# Patient Record
Sex: Male | Born: 1960 | Race: Black or African American | Hispanic: No | Marital: Married | Smoking: Current every day smoker
Health system: Southern US, Community
[De-identification: ages and names within clinical notes are randomized; demographics above are authoritative.]

## PROBLEM LIST (undated history)

## (undated) DIAGNOSIS — R569 Unspecified convulsions: Secondary | ICD-10-CM

## (undated) DIAGNOSIS — Z973 Presence of spectacles and contact lenses: Secondary | ICD-10-CM

## (undated) DIAGNOSIS — K219 Gastro-esophageal reflux disease without esophagitis: Secondary | ICD-10-CM

## (undated) DIAGNOSIS — I10 Essential (primary) hypertension: Secondary | ICD-10-CM

## (undated) HISTORY — PX: TONSILLECTOMY: SUR1361

## (undated) HISTORY — PX: COLONOSCOPY: SHX174

## (undated) HISTORY — PX: WISDOM TOOTH EXTRACTION: SHX21

---

## 1997-07-07 ENCOUNTER — Ambulatory Visit (HOSPITAL_COMMUNITY): Admission: RE | Admit: 1997-07-07 | Discharge: 1997-07-07 | Payer: Self-pay | Admitting: Family Medicine

## 2005-01-17 HISTORY — PX: UMBILICAL HERNIA REPAIR: SUR1181

## 2005-11-30 ENCOUNTER — Encounter: Admission: RE | Admit: 2005-11-30 | Discharge: 2005-11-30 | Payer: Self-pay | Admitting: General Surgery

## 2005-12-05 ENCOUNTER — Ambulatory Visit (HOSPITAL_BASED_OUTPATIENT_CLINIC_OR_DEPARTMENT_OTHER): Admission: RE | Admit: 2005-12-05 | Discharge: 2005-12-05 | Payer: Self-pay | Admitting: General Surgery

## 2013-02-04 ENCOUNTER — Ambulatory Visit
Admission: RE | Admit: 2013-02-04 | Discharge: 2013-02-04 | Disposition: A | Discharge status: Home / Self Care | Payer: Worker's Compensation | Source: Ambulatory Visit | Attending: Family Medicine | Admitting: Family Medicine

## 2013-02-04 ENCOUNTER — Other Ambulatory Visit: Payer: Self-pay | Admitting: Family Medicine

## 2013-02-04 DIAGNOSIS — M25511 Pain in right shoulder: Secondary | ICD-10-CM

## 2013-04-11 ENCOUNTER — Other Ambulatory Visit: Payer: Self-pay | Admitting: Orthopedic Surgery

## 2013-04-30 ENCOUNTER — Encounter (HOSPITAL_BASED_OUTPATIENT_CLINIC_OR_DEPARTMENT_OTHER): Payer: Self-pay | Admitting: *Deleted

## 2013-04-30 NOTE — Progress Notes (Signed)
Will come in for bmet-ekg-no cardiac or resp problems

## 2013-05-02 ENCOUNTER — Encounter (HOSPITAL_BASED_OUTPATIENT_CLINIC_OR_DEPARTMENT_OTHER)
Admission: RE | Admit: 2013-05-02 | Discharge: 2013-05-02 | Disposition: A | Discharge status: Home / Self Care | Payer: BC Managed Care – PPO | Source: Ambulatory Visit | Attending: Orthopedic Surgery | Admitting: Orthopedic Surgery

## 2013-05-02 ENCOUNTER — Other Ambulatory Visit: Payer: Self-pay

## 2013-05-02 DIAGNOSIS — Z01810 Encounter for preprocedural cardiovascular examination: Secondary | ICD-10-CM | POA: Insufficient documentation

## 2013-05-02 LAB — BASIC METABOLIC PANEL
BUN: 10 mg/dL (ref 6–23)
CALCIUM: 9.7 mg/dL (ref 8.4–10.5)
CO2: 23 mEq/L (ref 19–32)
Chloride: 97 mEq/L (ref 96–112)
Creatinine, Ser: 0.89 mg/dL (ref 0.50–1.35)
GFR calc non Af Amer: >90 mL/min (ref >90)
GLUCOSE: 85 mg/dL (ref 70–99)
Potassium: 4.6 mEq/L (ref 3.7–5.3)
Sodium: 137 mEq/L (ref 137–147)

## 2013-05-06 ENCOUNTER — Encounter (HOSPITAL_BASED_OUTPATIENT_CLINIC_OR_DEPARTMENT_OTHER): Payer: Self-pay

## 2013-05-06 ENCOUNTER — Ambulatory Visit (HOSPITAL_BASED_OUTPATIENT_CLINIC_OR_DEPARTMENT_OTHER)
Admission: RE | Admit: 2013-05-06 | Discharge: 2013-05-06 | Disposition: A | Discharge status: Home / Self Care | Payer: BC Managed Care – PPO | Source: Ambulatory Visit | Attending: Orthopedic Surgery | Admitting: Orthopedic Surgery

## 2013-05-06 ENCOUNTER — Encounter (HOSPITAL_BASED_OUTPATIENT_CLINIC_OR_DEPARTMENT_OTHER): Payer: BC Managed Care – PPO | Admitting: Anesthesiology

## 2013-05-06 ENCOUNTER — Ambulatory Visit (HOSPITAL_BASED_OUTPATIENT_CLINIC_OR_DEPARTMENT_OTHER): Payer: BC Managed Care – PPO | Admitting: Anesthesiology

## 2013-05-06 ENCOUNTER — Encounter (HOSPITAL_BASED_OUTPATIENT_CLINIC_OR_DEPARTMENT_OTHER)
Admission: RE | Disposition: A | Discharge status: Home / Self Care | Payer: Self-pay | Source: Ambulatory Visit | Attending: Orthopedic Surgery

## 2013-05-06 DIAGNOSIS — Y990 Civilian activity done for income or pay: Secondary | ICD-10-CM | POA: Insufficient documentation

## 2013-05-06 DIAGNOSIS — F17200 Nicotine dependence, unspecified, uncomplicated: Secondary | ICD-10-CM | POA: Insufficient documentation

## 2013-05-06 DIAGNOSIS — X58XXXA Exposure to other specified factors, initial encounter: Secondary | ICD-10-CM | POA: Insufficient documentation

## 2013-05-06 DIAGNOSIS — J4489 Other specified chronic obstructive pulmonary disease: Secondary | ICD-10-CM | POA: Insufficient documentation

## 2013-05-06 DIAGNOSIS — I10 Essential (primary) hypertension: Secondary | ICD-10-CM | POA: Insufficient documentation

## 2013-05-06 DIAGNOSIS — S43429A Sprain of unspecified rotator cuff capsule, initial encounter: Secondary | ICD-10-CM | POA: Insufficient documentation

## 2013-05-06 DIAGNOSIS — J449 Chronic obstructive pulmonary disease, unspecified: Secondary | ICD-10-CM | POA: Insufficient documentation

## 2013-05-06 DIAGNOSIS — M75100 Unspecified rotator cuff tear or rupture of unspecified shoulder, not specified as traumatic: Secondary | ICD-10-CM

## 2013-05-06 HISTORY — DX: Essential (primary) hypertension: I10

## 2013-05-06 HISTORY — PX: SHOULDER ARTHROSCOPY WITH SUBACROMIAL DECOMPRESSION: SHX5684

## 2013-05-06 HISTORY — DX: Presence of spectacles and contact lenses: Z97.3

## 2013-05-06 LAB — POCT HEMOGLOBIN-HEMACUE: Hemoglobin: 15.3 g/dL (ref 13.0–17.0)

## 2013-05-06 SURGERY — SHOULDER ARTHROSCOPY WITH SUBACROMIAL DECOMPRESSION
Anesthesia: General | Laterality: Right

## 2013-05-06 MED ORDER — FENTANYL CITRATE 0.05 MG/ML IJ SOLN
50.0000 ug | Freq: Once | INTRAMUSCULAR | Status: AC
  Administered 2013-05-06: 100 ug via INTRAVENOUS

## 2013-05-06 MED ORDER — PHENYLEPHRINE HCL 10 MG/ML IJ SOLN
INTRAMUSCULAR | Status: DC | PRN
  Administered 2013-05-06: 40 ug via INTRAVENOUS

## 2013-05-06 MED ORDER — OXYCODONE HCL 5 MG/5ML PO SOLN: 5.0000 mg | Freq: Once | ORAL | Status: AC | PRN

## 2013-05-06 MED ORDER — FENTANYL CITRATE 0.05 MG/ML IJ SOLN: 50.0000 ug | INTRAMUSCULAR | Status: DC | PRN

## 2013-05-06 MED ORDER — HYDROMORPHONE HCL PF 1 MG/ML IJ SOLN
0.2500 mg | INTRAMUSCULAR | Status: DC | PRN
  Administered 2013-05-06: 0.5 mg via INTRAVENOUS

## 2013-05-06 MED ORDER — HYDROMORPHONE HCL PF 1 MG/ML IJ SOLN
INTRAMUSCULAR | Status: AC
  Filled 2013-05-06: qty 1

## 2013-05-06 MED ORDER — MIDAZOLAM HCL 2 MG/2ML IJ SOLN
1.0000 mg | INTRAMUSCULAR | Status: DC | PRN
  Administered 2013-05-06: 2 mg via INTRAVENOUS

## 2013-05-06 MED ORDER — OXYCODONE HCL 5 MG PO TABS
ORAL_TABLET | ORAL | Status: AC
  Filled 2013-05-06: qty 1

## 2013-05-06 MED ORDER — BUPIVACAINE-EPINEPHRINE PF 0.5-1:200000 % IJ SOLN
INTRAMUSCULAR | Status: DC | PRN
  Administered 2013-05-06: 25 mL

## 2013-05-06 MED ORDER — POVIDONE-IODINE 7.5 % EX SOLN: Freq: Once | CUTANEOUS | Status: DC

## 2013-05-06 MED ORDER — LIDOCAINE HCL (CARDIAC) 20 MG/ML IV SOLN
INTRAVENOUS | Status: DC | PRN
  Administered 2013-05-06: 80 mg via INTRAVENOUS

## 2013-05-06 MED ORDER — PROPOFOL 10 MG/ML IV BOLUS
INTRAVENOUS | Status: DC | PRN
  Administered 2013-05-06: 50 mg via INTRAVENOUS
  Administered 2013-05-06: 200 mg via INTRAVENOUS

## 2013-05-06 MED ORDER — DEXAMETHASONE SODIUM PHOSPHATE 4 MG/ML IJ SOLN
INTRAMUSCULAR | Status: DC | PRN
  Administered 2013-05-06: 10 mg via INTRAVENOUS

## 2013-05-06 MED ORDER — ROCURONIUM BROMIDE 100 MG/10ML IV SOLN
INTRAVENOUS | Status: DC | PRN
  Administered 2013-05-06: 40 mg via INTRAVENOUS

## 2013-05-06 MED ORDER — DOCUSATE SODIUM 100 MG PO CAPS: 100.0000 mg | ORAL_CAPSULE | Freq: Three times a day (TID) | ORAL | Status: DC | PRN

## 2013-05-06 MED ORDER — SODIUM CHLORIDE 0.9 % IR SOLN
Status: DC | PRN
Start: 2013-05-06 — End: 2013-05-06
  Administered 2013-05-06: 6000 mL

## 2013-05-06 MED ORDER — FENTANYL CITRATE 0.05 MG/ML IJ SOLN
INTRAMUSCULAR | Status: DC | PRN
  Administered 2013-05-06: 50 ug via INTRAVENOUS

## 2013-05-06 MED ORDER — ALBUTEROL SULFATE HFA 108 (90 BASE) MCG/ACT IN AERS
INHALATION_SPRAY | RESPIRATORY_TRACT | Status: DC | PRN
  Administered 2013-05-06 (×3): 2 via RESPIRATORY_TRACT

## 2013-05-06 MED ORDER — GLYCOPYRROLATE 0.2 MG/ML IJ SOLN
INTRAMUSCULAR | Status: DC | PRN
  Administered 2013-05-06: .7 mg via INTRAVENOUS

## 2013-05-06 MED ORDER — OXYCODONE HCL 5 MG PO TABS
5.0000 mg | ORAL_TABLET | Freq: Once | ORAL | Status: AC | PRN
  Administered 2013-05-06: 5 mg via ORAL

## 2013-05-06 MED ORDER — PROMETHAZINE HCL 25 MG/ML IJ SOLN: 6.2500 mg | INTRAMUSCULAR | Status: DC | PRN

## 2013-05-06 MED ORDER — NEOSTIGMINE METHYLSULFATE 1 MG/ML IJ SOLN
INTRAMUSCULAR | Status: DC | PRN
  Administered 2013-05-06: 4 mg via INTRAVENOUS

## 2013-05-06 MED ORDER — DEXAMETHASONE SODIUM PHOSPHATE 10 MG/ML IJ SOLN
INTRAMUSCULAR | Status: DC | PRN
  Administered 2013-05-06: 4 mg

## 2013-05-06 MED ORDER — SUCCINYLCHOLINE CHLORIDE 20 MG/ML IJ SOLN
INTRAMUSCULAR | Status: DC | PRN
  Administered 2013-05-06: 100 mg via INTRAVENOUS

## 2013-05-06 MED ORDER — FENTANYL CITRATE 0.05 MG/ML IJ SOLN
INTRAMUSCULAR | Status: AC
  Filled 2013-05-06: qty 2

## 2013-05-06 MED ORDER — LACTATED RINGERS IV SOLN
INTRAVENOUS | Status: DC
  Administered 2013-05-06: 11:00:00 via INTRAVENOUS
  Administered 2013-05-06: 10 mL/h via INTRAVENOUS

## 2013-05-06 MED ORDER — CEFAZOLIN SODIUM-DEXTROSE 2-3 GM-% IV SOLR
INTRAVENOUS | Status: AC
  Filled 2013-05-06: qty 50

## 2013-05-06 MED ORDER — MIDAZOLAM HCL 2 MG/2ML IJ SOLN: 1.0000 mg | INTRAMUSCULAR | Status: DC | PRN

## 2013-05-06 MED ORDER — MIDAZOLAM HCL 2 MG/2ML IJ SOLN
INTRAMUSCULAR | Status: AC
  Filled 2013-05-06: qty 2

## 2013-05-06 MED ORDER — CEFAZOLIN SODIUM-DEXTROSE 2-3 GM-% IV SOLR
2.0000 g | INTRAVENOUS | Status: AC
  Administered 2013-05-06: 2 g via INTRAVENOUS

## 2013-05-06 MED ORDER — PHENYLEPHRINE HCL 10 MG/ML IJ SOLN
10.0000 mg | INTRAMUSCULAR | Status: DC | PRN
  Administered 2013-05-06: 50 ug/min via INTRAVENOUS

## 2013-05-06 MED ORDER — OXYCODONE-ACETAMINOPHEN 5-325 MG PO TABS: 1.0000 | ORAL_TABLET | ORAL | Status: DC | PRN

## 2013-05-06 MED ORDER — ONDANSETRON HCL 4 MG/2ML IJ SOLN
INTRAMUSCULAR | Status: DC | PRN
  Administered 2013-05-06: 4 mg via INTRAVENOUS

## 2013-05-06 MED ORDER — EPHEDRINE SULFATE 50 MG/ML IJ SOLN
INTRAMUSCULAR | Status: DC | PRN
  Administered 2013-05-06 (×2): 15 mg via INTRAVENOUS

## 2013-05-06 SURGICAL SUPPLY — 87 items
ADH SKN CLS APL DERMABOND .7 (GAUZE/BANDAGES/DRESSINGS)
ANCH SUT SWLK 19.1X4.75 VT (Anchor) ×4 IMPLANT
ANCHOR PEEK 4.75X19.1 SWLK C (Anchor) ×8 IMPLANT
APL SKNCLS STERI-STRIP NONHPOA (GAUZE/BANDAGES/DRESSINGS)
BENZOIN TINCTURE PRP APPL 2/3 (GAUZE/BANDAGES/DRESSINGS) IMPLANT
BLADE SURG 15 STRL LF DISP TIS (BLADE) IMPLANT
BLADE SURG 15 STRL SS (BLADE)
BLADE SURG ROTATE 9660 (MISCELLANEOUS) IMPLANT
BUR OVAL 4.0 (BURR) ×3 IMPLANT
CANISTER SUCT 3000ML (MISCELLANEOUS) IMPLANT
CANNULA 5.75X71 LONG (CANNULA) ×3 IMPLANT
CANNULA TWIST IN 8.25X7CM (CANNULA) ×2 IMPLANT
CHLORAPREP W/TINT 26ML (MISCELLANEOUS) ×3 IMPLANT
CLOSURE WOUND 1/2 X4 (GAUZE/BANDAGES/DRESSINGS)
DECANTER SPIKE VIAL GLASS SM (MISCELLANEOUS) IMPLANT
DERMABOND ADVANCED (GAUZE/BANDAGES/DRESSINGS)
DERMABOND ADVANCED .7 DNX12 (GAUZE/BANDAGES/DRESSINGS) IMPLANT
DRAPE INCISE IOBAN 66X45 STRL (DRAPES) ×3 IMPLANT
DRAPE STERI 35X30 U-POUCH (DRAPES) ×3 IMPLANT
DRAPE SURG 17X23 STRL (DRAPES) ×3 IMPLANT
DRAPE U 20/CS (DRAPES) ×3 IMPLANT
DRAPE U-SHAPE 47X51 STRL (DRAPES) ×3 IMPLANT
DRAPE U-SHAPE 76X120 STRL (DRAPES) ×6 IMPLANT
DRSG PAD ABDOMINAL 8X10 ST (GAUZE/BANDAGES/DRESSINGS) ×3 IMPLANT
ELECT REM PT RETURN 9FT ADLT (ELECTROSURGICAL) ×3
ELECTRODE REM PT RTRN 9FT ADLT (ELECTROSURGICAL) ×1 IMPLANT
GAUZE SPONGE 4X4 16PLY XRAY LF (GAUZE/BANDAGES/DRESSINGS) IMPLANT
GAUZE XEROFORM 1X8 LF (GAUZE/BANDAGES/DRESSINGS) ×3 IMPLANT
GLOVE BIO SURGEON STRL SZ 6.5 (GLOVE) ×1 IMPLANT
GLOVE BIO SURGEON STRL SZ7 (GLOVE) ×3 IMPLANT
GLOVE BIO SURGEON STRL SZ7.5 (GLOVE) ×6 IMPLANT
GLOVE BIO SURGEONS STRL SZ 6.5 (GLOVE) ×1
GLOVE BIOGEL PI IND STRL 7.0 (GLOVE) ×1 IMPLANT
GLOVE BIOGEL PI IND STRL 8 (GLOVE) ×2 IMPLANT
GLOVE BIOGEL PI INDICATOR 7.0 (GLOVE) ×4
GLOVE BIOGEL PI INDICATOR 8 (GLOVE) ×4
GOWN STRL REUS W/ TWL LRG LVL3 (GOWN DISPOSABLE) ×2 IMPLANT
GOWN STRL REUS W/TWL LRG LVL3 (GOWN DISPOSABLE) ×6
GOWN STRL REUS W/TWL XL LVL4 (GOWN DISPOSABLE) ×3 IMPLANT
MANIFOLD NEPTUNE II (INSTRUMENTS) ×3 IMPLANT
NDL 1/2 CIR CATGUT .05X1.09 (NEEDLE) IMPLANT
NDL SCORPION MULTI FIRE (NEEDLE) IMPLANT
NDL SUT 6 .5 CRC .975X.05 MAYO (NEEDLE) IMPLANT
NEEDLE 1/2 CIR CATGUT .05X1.09 (NEEDLE) IMPLANT
NEEDLE MAYO TAPER (NEEDLE)
NEEDLE SCORPION MULTI FIRE (NEEDLE) ×3 IMPLANT
NS IRRIG 1000ML POUR BTL (IV SOLUTION) IMPLANT
PACK ARTHROSCOPY DSU (CUSTOM PROCEDURE TRAY) ×3 IMPLANT
PACK BASIN DAY SURGERY FS (CUSTOM PROCEDURE TRAY) ×3 IMPLANT
PENCIL BUTTON HOLSTER BLD 10FT (ELECTRODE) IMPLANT
RESECTOR FULL RADIUS 4.2MM (BLADE) ×3 IMPLANT
SHEET MEDIUM DRAPE 40X70 STRL (DRAPES) IMPLANT
SLEEVE SCD COMPRESS KNEE MED (MISCELLANEOUS) ×3 IMPLANT
SLING ARM IMMOBILIZER MED (SOFTGOODS) ×2 IMPLANT
SLING ARM LRG ADULT FOAM STRAP (SOFTGOODS) ×2 IMPLANT
SLING ARM MED ADULT FOAM STRAP (SOFTGOODS) IMPLANT
SLING ARM XL FOAM STRAP (SOFTGOODS) ×2 IMPLANT
SPONGE GAUZE 4X4 12PLY (GAUZE/BANDAGES/DRESSINGS) ×3 IMPLANT
SPONGE LAP 4X18 X RAY DECT (DISPOSABLE) IMPLANT
STRIP CLOSURE SKIN 1/2X4 (GAUZE/BANDAGES/DRESSINGS) IMPLANT
SUCTION FRAZIER TIP 10 FR DISP (SUCTIONS) IMPLANT
SUPPORT WRAP ARM LG (MISCELLANEOUS) ×2 IMPLANT
SUT BONE WAX W31G (SUTURE) IMPLANT
SUT ETHIBOND 2 OS 4 DA (SUTURE) IMPLANT
SUT ETHILON 3 0 PS 1 (SUTURE) ×3 IMPLANT
SUT ETHILON 4 0 PS 2 18 (SUTURE) IMPLANT
SUT FIBERWIRE #2 38 T-5 BLUE (SUTURE)
SUT MNCRL AB 3-0 PS2 18 (SUTURE) IMPLANT
SUT MNCRL AB 4-0 PS2 18 (SUTURE) IMPLANT
SUT PDS AB 0 CT 36 (SUTURE) IMPLANT
SUT PROLENE 3 0 PS 2 (SUTURE) IMPLANT
SUT TIGER TAPE 7 IN WHITE (SUTURE) ×4 IMPLANT
SUT VIC AB 0 CT1 27 (SUTURE)
SUT VIC AB 0 CT1 27XBRD ANBCTR (SUTURE) IMPLANT
SUT VIC AB 2-0 SH 27 (SUTURE)
SUT VIC AB 2-0 SH 27XBRD (SUTURE) IMPLANT
SUTURE FIBERWR #2 38 T-5 BLUE (SUTURE) IMPLANT
SYR BULB 3OZ (MISCELLANEOUS) IMPLANT
TAPE FIBER 2MM 7IN #2 BLUE (SUTURE) ×4 IMPLANT
TOWEL OR 17X24 6PK STRL BLUE (TOWEL DISPOSABLE) ×3 IMPLANT
TOWEL OR NON WOVEN STRL DISP B (DISPOSABLE) ×3 IMPLANT
TUBE CONNECTING 20'X1/4 (TUBING) ×1
TUBE CONNECTING 20X1/4 (TUBING) ×2 IMPLANT
TUBING ARTHROSCOPY IRRIG 16FT (MISCELLANEOUS) ×3 IMPLANT
WAND STAR VAC 90 (SURGICAL WAND) ×3 IMPLANT
WATER STERILE IRR 1000ML POUR (IV SOLUTION) ×3 IMPLANT
YANKAUER SUCT BULB TIP NO VENT (SUCTIONS) IMPLANT

## 2013-05-06 NOTE — Anesthesia Preprocedure Evaluation (Signed)
Anesthesia Evaluation  Patient identified by MRN, date of birth, ID band Patient awake    Reviewed: Allergy & Precautions, H&P , NPO status , Patient's Chart, lab work & pertinent test results  Airway Mallampati: II TM Distance: >3 FB Neck ROM: Full    Dental   Pulmonary COPDCurrent Smoker,  + rhonchi         Cardiovascular hypertension, Rhythm:Regular Rate:Normal     Neuro/Psych    GI/Hepatic   Endo/Other    Renal/GU      Musculoskeletal   Abdominal   Peds  Hematology   Anesthesia Other Findings   Reproductive/Obstetrics                           Anesthesia Physical Anesthesia Plan  ASA: II  Anesthesia Plan: General   Post-op Pain Management:    Induction: Intravenous  Airway Management Planned: Oral ETT  Additional Equipment:   Intra-op Plan:   Post-operative Plan: Extubation in OR  Informed Consent: I have reviewed the patients History and Physical, chart, labs and discussed the procedure including the risks, benefits and alternatives for the proposed anesthesia with the patient or authorized representative who has indicated his/her understanding and acceptance.     Plan Discussed with: CRNA and Surgeon  Anesthesia Plan Comments:         Anesthesia Quick Evaluation

## 2013-05-06 NOTE — Progress Notes (Signed)
Assisted Dr. Kasik with right, ultrasound guided, interscalene  block. Side rails up, monitors on throughout procedure. See vital signs in flow sheet. Tolerated Procedure well. 

## 2013-05-06 NOTE — Transfer of Care (Signed)
Immediate Anesthesia Transfer of Care Note  Patient: Reginald [name]  Procedure(s) Performed: Procedure(s) with comments: Right shoulder arthroscopy rotator cuff tear repair, subacromial decompression (Right) - Right shoulder arthroscopy rotator cuff tear repair, subacromial decompression  Patient Location: PACU  Anesthesia Type:General  Level of Consciousness: awake and alert   Airway & Oxygen Therapy: Patient Spontanous Breathing and Patient connected to face mask oxygen  Post-op Assessment: Report given to PACU RN and Post -op Vital signs reviewed and stable  Post vital signs: Reviewed and stable  Complications: No apparent anesthesia complications

## 2013-05-06 NOTE — Anesthesia Postprocedure Evaluation (Signed)
  Anesthesia Post-op Note  Patient: Reginald [name]  Procedure(s) Performed: Procedure(s) with comments: Right shoulder arthroscopy rotator cuff tear repair, subacromial decompression (Right) - Right shoulder arthroscopy rotator cuff tear repair, subacromial decompression  Patient Location: PACU  Anesthesia Type:GA combined with regional for post-op pain  Level of Consciousness: awake and alert   Airway and Oxygen Therapy: Patient Spontanous Breathing  Post-op Pain: none  Post-op Assessment: Post-op Vital signs reviewed, Patient's Cardiovascular Status Stable, Respiratory Function Stable, Patent Airway, No signs of Nausea or vomiting and Pain level controlled  Post-op Vital Signs: Reviewed and stable  Last Vitals:  Filed Vitals:   05/06/13 1430  BP: 131/85  Pulse: 79  Temp:   Resp: 26    Complications: No apparent anesthesia complications

## 2013-05-06 NOTE — Op Note (Signed)
Procedure(s): Right shoulder arthroscopy rotator cuff tear repair, subacromial decompression Procedure Note  Keighan Mayford KnifeWilliams male 53 y.o. 05/06/2013  Procedure(s) and Anesthesia Type:    #1 right shoulder arthroscopic rotator cuff repair   #2 right shoulder arthroscopic subacromial decompression    #3 right shoulder arthroscopic biceps tenotomy/debridement  Surgeon(s) and Role:    * Mable ParisJustin William Donoven Pett, MD - Primary     Surgeon: Mable ParisJustin William Geraldine Sandberg   Assistants: Damita Lackanielle Lalibert PA-C [facility](Danielle was present and scrubbed throughout the procedure and was essential in positioning, assisting with the camera and instrumentation,, and closure)  Anesthesia: General endotracheal anesthesia with preoperative interscalene block    Procedure Detail  Right shoulder arthroscopy rotator cuff tear repair, subacromial decompression  Estimated Blood Loss: Min         Drains: none  Blood Given: none         Specimens: none        Complications:  * No complications entered in OR log *         Disposition: PACU - hemodynamically stable.         Condition: stable    Procedure:   INDICATIONS FOR SURGERY: The patient is 53 y.o. male who had an injury at work to his right shoulder. He failed conservative management and would not have an MRI which revealed a full thickness supraspinatus tear. He was indicated for surgery to try and decrease pain and restore function. He understood risks benefits alternatives to the procedure and wished to go forward with surgery.  OPERATIVE FINDINGS: Examination under anesthesia: No stiffness or instability Diagnostic Arthroscopy:  Glenoid articular cartilage: Intact Humeral head articular cartilage: Intact Labrum: Intact, severe long head proximal biceps tendon tear involving about 75% of the tendon. Biceps tenotomy was performed for pain relief. 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  DESCRIPTION OF PROCEDURE: The patient was identified in preoperative  holding area where I personally marked the operative site after  verifying site, side, and procedure with the patient. An interscalene block was given by the attending anesthesiologist the holding area.  The patient was taken back to the operating room where general anesthesia was induced without complication and was placed in the beach-chair position with the back  elevated about 60 degrees and all extremities and head and neck carefully padded and  positioned.   The right upper extremity was then prepped and  draped in a standard sterile fashion. The appropriate time-out  procedure was carried out. The patient did receive IV antibiotics  within 30 minutes of incision.   A small posterior portal incision was made and the arthroscope was introduced into the joint. An anterior portal was then established above the subscapularis using needle localization. Small cannula was placed anteriorly. Diagnostic arthroscopy was then carried out with findings as described above.  Initially noted was a lot of severe tearing of the proximal biceps tendon. This was debrided. The remaining biceps tendon was minimal and clearly involved greater than 50% of the thickness of the tendon. This was felt to be a significant pain generator if left untreated and therefore a biceps tenotomy was performed with the ArthriCare. The biceps tendon was allowed to retract from the joint. The upper border of the subscapularis have partial-thickness tear which was also debrided with a shaver down to healthy bleeding tendon. The remainder the  tendon was intact and no repair was felt necessary. The humeral joint surfaces were intact without significant chondromalacia. The posterior rotator cuff was  intact. The supraspinatus was torn and retracted minimally. This was gently debrided from the undersurface.  The arthroscope was then introduced into the subacromial space a standard lateral portal was established with needle localization. The shaver was used through the lateral portal to perform extensive bursectomy. Coracoacromial ligament was examined and found to be severely frayed indicating chronic impingement..  The rotator cuff tear was examined carefully from the bursal surface and extensively debrided. The tear involved the entire supraspinatus but there is fairly minimal retraction of 1-1.5 cm. A lateral portal was established with a large cannula. A posterior lateral viewing portal was established and the camera was moved to this position for viewing. The tuberosity was debrided down to bleeding bone for healing. The rotator cuff tear was then repaired arthroscopically using 2 4.75 peak swivel lock anchors placed percutaneously off the lateral edge of the acromion, just off the articular margin. These were preloaded with fiber tape. The 4 strands of fiber tape were passed evenly spread throughout the tear and then brought over in a crossing pattern into 2 additional 4.75 swivel lock anchors in a lateral row. This allowed the tendon to lay down very nicely over the tuberosity. There is no undue tension on the repair.  The coracoacromial ligament was taken down off the anterior acromion with the ArthroCare exposing a moderate sized hooked anterior acromial spur. A high-speed bur was then used through the lateral portal to take down the anterior acromial spur from lateral to medial in a standard acromioplasty.  The acromioplasty was also viewed from the lateral portal and the bur was used as necessary to ensure that the acromion was completely flat from posterior to anterior.  The arthroscopic equipment was removed from the joint and the portals were closed with 3-0 nylon in an interrupted fashion.  Sterile dressings were then applied including Xeroform 4 x 4's ABDs and tape. The patient was then allowed to awaken from general anesthesia, placed in a sling, transferred to the stretcher and taken to the recovery room in stable condition.   POSTOPERATIVE PLAN: The patient will be discharged home today and will followup in one week for suture removal and wound check.  He will follow the standard cuff protocol.

## 2013-05-06 NOTE — Anesthesia Procedure Notes (Addendum)
Anesthesia Regional Block:  Interscalene brachial plexus block  Pre-Anesthetic Checklist: ,, timeout performed, Correct Patient, Correct Site, Correct Laterality, Correct Procedure, Correct Position, site marked, Risks and benefits discussed, Surgical consent,  Pre-op evaluation,  Post-op pain management  Laterality: Right  Prep: chloraprep       Needles:  Injection technique: Single-shot  Needle Type: Echogenic Stimulator Needle     Needle Length: 5cm 5 cm Needle Gauge: 22 and 22 G    Additional Needles:  Procedures: ultrasound guided (picture in chart) and nerve stimulator Interscalene brachial plexus block  Nerve Stimulator or Paresthesia:  Response: 0.5 mA,   Additional Responses:   Narrative:  Start time: 05/06/2013 10:46 AM End time: 05/06/2013 11:00 AM Injection made incrementally with aspirations every 5 mL. Anesthesiologist: Dr Gypsy Balsamkasik  Additional Notes: 1046-1100 R ISB POP CHG prep, sterile tech #22 stim/echo needle with good US visualization and PIX in chart Stim down to .5ma Multiple neg asp Reginald BuffMarc .5% w/epi 1:200000 total 25cc+decadron 4mg  infiltrated No compl Dr Gypsy BalsamKasik   Procedure Name: Intubation Performed by: York [name], Reginald Mollica W Pre-anesthesia Checklist: Patient identified, Timeout performed, Emergency Drugs available, Suction available and Patient being monitored Patient Re-evaluated:Patient Re-evaluated prior to inductionOxygen Delivery Method: Circle system utilized Preoxygenation: Pre-oxygenation with 100% oxygen Intubation Type: IV induction Ventilation: Mask ventilation without difficulty Laryngoscope Size: Miller and 2 Grade View: Grade II Tube type: Oral Number of attempts: 1 Airway Equipment and Method: Stylet Secured at: 22 cm Tube secured with: Tape Dental Injury: Teeth and Oropharynx as per pre-operative assessment

## 2013-05-06 NOTE — Discharge Instructions (Signed)
°Post Anesthesia Home Care Instructions ° °Activity: °Get plenty of rest for the remainder of the day. A responsible adult should stay with you for 24 hours following the procedure.  °For the next 24 hours, DO NOT: °-Drive a car °-Operate machinery °-Drink alcoholic beverages °-Take any medication unless instructed by your physician °-Make any legal decisions or sign important papers. ° °Meals: °Start with liquid foods such as gelatin or soup. Progress to regular foods as tolerated. Avoid greasy, spicy, heavy foods. If nausea and/or vomiting occur, drink only clear liquids until the nausea and/or vomiting subsides. Call your physician if vomiting continues. ° °Special Instructions/Symptoms: °Your throat may feel dry or sore from the anesthesia or the breathing tube placed in your throat during surgery. If this causes discomfort, gargle with warm salt water. The discomfort should disappear within 24 hours. ° ° °Regional Anesthesia Blocks ° °1. Numbness or the inability to move the "blocked" extremity may last from 3-48 hours after placement. The length of time depends on the medication injected and your individual response to the medication. If the numbness is not going away after 48 hours, call your surgeon. ° °2. The extremity that is blocked will need to be protected until the numbness is gone and the  Strength has returned. Because you cannot feel it, you will need to take extra care to avoid injury. Because it may be weak, you may have difficulty moving it or using it. You may not know what position it is in without looking at it while the block is in effect. ° °3. For blocks in the legs and feet, returning to weight bearing and walking needs to be done carefully. You will need to wait until the numbness is entirely gone and the strength has returned. You should be able to move your leg and foot normally before you try and bear weight or walk. You will need someone to be with you when you first try to ensure  you do not fall and possibly risk injury. ° °4. Bruising and tenderness at the needle site are common side effects and will resolve in a few days. ° °5. Persistent numbness or new problems with movement should be communicated to the surgeon or the [facility] ([phone])/ [facility] ([phone]). ° ° ° °Discharge Instructions after Arthroscopic Shoulder Repair ° ° °A sling has been provided for you. Remain in your sling at all times. This includes sleeping in your sling.  °Use ice on the shoulder intermittently over the first 48 hours after surgery.  °Pain medicine has been prescribed for you.  °Use your medicine liberally over the first 48 hours, and then you can begin to taper your use. You may take Extra Strength Tylenol or Tylenol only in place of the pain pills. DO NOT take ANY nonsteroidal anti-inflammatory pain medications: Advil, Motrin, Ibuprofen, Aleve, Naproxen, or Narprosyn.  °You may remove your dressing after two days. If the incision sites are still moist, place a Band-Aid over the moist site(s). Change Band-Aids daily until dry.  °You may shower 5 days after surgery. The incisions CANNOT get wet prior to 5 days. Simply allow the water to wash over the site and then pat dry. Do not rub the incisions. Make sure your axilla (armpit) is completely dry after showering.  °Take one aspirin a day for 2 weeks after surgery, unless you have an aspirin sensitivity/ allergy or asthma. ° ° °Please call [phone] during normal business hours or [phone] after hours   for any problems. Including the following: ° °- excessive redness of the incisions °- drainage for more than 4 days °- fever of more than 101.5 F ° °*Please note that pain medications will not be refilled after hours or on weekends. ° ° ° °

## 2013-05-06 NOTE — H&P (Signed)
Reginald [name] is an 53 y.o. male.   Chief Complaint: R shoulder pain and dysfunction HPI: R shoulder RCT after injury at work.  Failed nonoperative treatment  Past Medical History  Diagnosis Date  . Hypertension   . Wears glasses     Past Surgical History  Procedure Laterality Date  . Umbilical hernia repair  2007  . Tonsillectomy    . Wisdom tooth extraction    . Colonoscopy      History reviewed. No pertinent family history. Social History:  reports that he has been smoking.  He does not have any smokeless tobacco history on file. He reports that he drinks alcohol. He reports that he does not use illicit drugs.  Allergies:  Allergies  Allergen Reactions  . Penicillins Itching    Medications Prior to Admission  Medication Sig Dispense Refill  . amLODipine-valsartan (EXFORGE) 10-160 MG per tablet Take 1 tablet by mouth daily.      . hydrochlorothiazide (HYDRODIURIL) 25 MG tablet Take 25 mg by mouth daily. Combo med with all 3-not in computer      . meloxicam (MOBIC) 15 MG tablet Take 15 mg by mouth daily.      . Omega-3 Fatty Acids (FISH OIL) 1000 MG CAPS Take by mouth.        No results found for this or any previous visit (from the past 48 hour(s)). No results found.  Review of Systems  All other systems reviewed and are negative.   Blood pressure 137/77, pulse 76, temperature 98.5 F (36.9 C), temperature source Oral, resp. rate 17, height 6\' 3"  (1.905 m), weight 102.513 kg (226 lb), SpO2 98.00%. Physical Exam  Constitutional: He is oriented to person, place, and time. He appears well-developed and well-nourished.  HENT:  Head: Atraumatic.  Eyes: EOM are normal.  Cardiovascular: Intact distal pulses.   Respiratory: Effort normal.  Musculoskeletal:  R shoulder pain and weakness with RC testing.   Neurological: He is alert and oriented to person, place, and time.  Skin: Skin is warm and dry.  Psychiatric: He has a normal mood and affect.      Assessment/Plan R shoulder RCT Plan Arth RCR/SAD Risks / benefits of surgery discussed Consent on chart  NPO for OR Preop antibiotics   Reginald [name] 05/06/2013, 11:39 AM

## 2013-05-07 ENCOUNTER — Encounter (HOSPITAL_BASED_OUTPATIENT_CLINIC_OR_DEPARTMENT_OTHER): Payer: Self-pay | Admitting: Orthopedic Surgery

## 2015-03-17 ENCOUNTER — Emergency Department (HOSPITAL_COMMUNITY): Payer: BLUE CROSS/BLUE SHIELD

## 2015-03-17 ENCOUNTER — Encounter (HOSPITAL_COMMUNITY): Payer: Self-pay | Admitting: Emergency Medicine

## 2015-03-17 ENCOUNTER — Inpatient Hospital Stay (HOSPITAL_COMMUNITY)
Admission: EM | Admit: 2015-03-17 | Discharge: 2015-03-20 | DRG: 100 | Disposition: A | Discharge status: Home / Self Care | Payer: BLUE CROSS/BLUE SHIELD | Attending: Family Medicine | Admitting: Family Medicine

## 2015-03-17 DIAGNOSIS — F17200 Nicotine dependence, unspecified, uncomplicated: Secondary | ICD-10-CM | POA: Diagnosis present

## 2015-03-17 DIAGNOSIS — G9389 Other specified disorders of brain: Secondary | ICD-10-CM | POA: Diagnosis present

## 2015-03-17 DIAGNOSIS — F1020 Alcohol dependence, uncomplicated: Secondary | ICD-10-CM | POA: Diagnosis present

## 2015-03-17 DIAGNOSIS — G40401 Other generalized epilepsy and epileptic syndromes, not intractable, with status epilepticus: Secondary | ICD-10-CM | POA: Diagnosis present

## 2015-03-17 DIAGNOSIS — Z8673 Personal history of transient ischemic attack (TIA), and cerebral infarction without residual deficits: Secondary | ICD-10-CM

## 2015-03-17 DIAGNOSIS — I6350 Cerebral infarction due to unspecified occlusion or stenosis of unspecified cerebral artery: Secondary | ICD-10-CM | POA: Diagnosis not present

## 2015-03-17 DIAGNOSIS — I639 Cerebral infarction, unspecified: Secondary | ICD-10-CM

## 2015-03-17 DIAGNOSIS — G939 Disorder of brain, unspecified: Secondary | ICD-10-CM

## 2015-03-17 DIAGNOSIS — E876 Hypokalemia: Secondary | ICD-10-CM | POA: Insufficient documentation

## 2015-03-17 DIAGNOSIS — J189 Pneumonia, unspecified organism: Secondary | ICD-10-CM

## 2015-03-17 DIAGNOSIS — I119 Hypertensive heart disease without heart failure: Secondary | ICD-10-CM | POA: Diagnosis present

## 2015-03-17 DIAGNOSIS — Z8679 Personal history of other diseases of the circulatory system: Secondary | ICD-10-CM | POA: Diagnosis not present

## 2015-03-17 DIAGNOSIS — Z9114 Patient's other noncompliance with medication regimen: Secondary | ICD-10-CM | POA: Diagnosis not present

## 2015-03-17 DIAGNOSIS — J690 Pneumonitis due to inhalation of food and vomit: Secondary | ICD-10-CM | POA: Diagnosis present

## 2015-03-17 DIAGNOSIS — G40901 Epilepsy, unspecified, not intractable, with status epilepticus: Secondary | ICD-10-CM | POA: Diagnosis present

## 2015-03-17 DIAGNOSIS — R569 Unspecified convulsions: Secondary | ICD-10-CM | POA: Diagnosis present

## 2015-03-17 DIAGNOSIS — G40409 Other generalized epilepsy and epileptic syndromes, not intractable, without status epilepticus: Secondary | ICD-10-CM | POA: Diagnosis present

## 2015-03-17 HISTORY — DX: Unspecified convulsions: R56.9

## 2015-03-17 LAB — CBC WITH DIFFERENTIAL/PLATELET
BASOS PCT: 0 %
Basophils Absolute: 0 10*3/uL (ref 0.0–0.1)
EOS ABS: 0 10*3/uL (ref 0.0–0.7)
Eosinophils Relative: 0 %
HCT: 43.1 % (ref 39.0–52.0)
HEMOGLOBIN: 13.8 g/dL (ref 13.0–17.0)
Lymphocytes Relative: 8 %
Lymphs Abs: 1.3 10*3/uL (ref 0.7–4.0)
MCH: 25.9 pg — ABNORMAL LOW (ref 26.0–34.0)
MCHC: 32 g/dL (ref 30.0–36.0)
MCV: 80.9 fL (ref 78.0–100.0)
Monocytes Absolute: 1.3 10*3/uL — ABNORMAL HIGH (ref 0.1–1.0)
Monocytes Relative: 8 %
NEUTROS ABS: 14.4 10*3/uL — AB (ref 1.7–7.7)
NEUTROS PCT: 84 %
Platelets: 419 10*3/uL — ABNORMAL HIGH (ref 150–400)
RBC: 5.33 MIL/uL (ref 4.22–5.81)
RDW: 16.2 % — ABNORMAL HIGH (ref 11.5–15.5)
WBC: 17.1 10*3/uL — AB (ref 4.0–10.5)

## 2015-03-17 LAB — URINE MICROSCOPIC-ADD ON

## 2015-03-17 LAB — URINALYSIS, ROUTINE W REFLEX MICROSCOPIC
BILIRUBIN URINE: NEGATIVE
Glucose, UA: NEGATIVE mg/dL
Ketones, ur: NEGATIVE mg/dL
Leukocytes, UA: NEGATIVE
NITRITE: NEGATIVE
PH: 5.5 (ref 5.0–8.0)
Protein, ur: NEGATIVE mg/dL
SPECIFIC GRAVITY, URINE: 1.02 (ref 1.005–1.030)

## 2015-03-17 LAB — ETHANOL: Alcohol, Ethyl (B): <5 mg/dL (ref <5)

## 2015-03-17 LAB — COMPREHENSIVE METABOLIC PANEL
AST: 45 U/L — ABNORMAL HIGH (ref 15–41)
Albumin: 4 g/dL (ref 3.5–5.0)
Alkaline Phosphatase: 67 U/L (ref 38–126)
Anion gap: 16 — ABNORMAL HIGH (ref 5–15)
BUN: 11 mg/dL (ref 6–20)
CHLORIDE: 105 mmol/L (ref 101–111)
CO2: 19 mmol/L — AB (ref 22–32)
CREATININE: 1.18 mg/dL (ref 0.61–1.24)
Calcium: 8.9 mg/dL (ref 8.9–10.3)
GFR calc non Af Amer: >60 mL/min (ref >60)
Glucose, Bld: 147 mg/dL — ABNORMAL HIGH (ref 65–99)
POTASSIUM: 2.9 mmol/L — AB (ref 3.5–5.1)
SODIUM: 140 mmol/L (ref 135–145)
Total Bilirubin: 0.3 mg/dL (ref 0.3–1.2)
Total Protein: 7.7 g/dL (ref 6.5–8.1)

## 2015-03-17 LAB — RAPID URINE DRUG SCREEN, HOSP PERFORMED
AMPHETAMINES: NOT DETECTED
BARBITURATES: NOT DETECTED
Benzodiazepines: NOT DETECTED
COCAINE: NOT DETECTED
OPIATES: NOT DETECTED
TETRAHYDROCANNABINOL: POSITIVE — AB

## 2015-03-17 LAB — TROPONIN I

## 2015-03-17 LAB — TSH: TSH: 0.528 u[IU]/mL (ref 0.350–4.500)

## 2015-03-17 LAB — MRSA PCR SCREENING: MRSA BY PCR: NEGATIVE

## 2015-03-17 LAB — ACETAMINOPHEN LEVEL

## 2015-03-17 LAB — SALICYLATE LEVEL

## 2015-03-17 MED ORDER — POTASSIUM CHLORIDE 10 MEQ/100ML IV SOLN
10.0000 meq | Freq: Once | INTRAVENOUS | Status: AC
  Administered 2015-03-17: 10 meq via INTRAVENOUS
  Filled 2015-03-17: qty 100

## 2015-03-17 MED ORDER — ASPIRIN 81 MG PO CHEW
81.0000 mg | CHEWABLE_TABLET | Freq: Every day | ORAL | Status: DC
  Administered 2015-03-18 – 2015-03-20 (×3): 81 mg via ORAL
  Filled 2015-03-17 (×3): qty 1

## 2015-03-17 MED ORDER — ASPIRIN 325 MG PO TABS: 325.0000 mg | ORAL_TABLET | Freq: Every day | ORAL | Status: DC

## 2015-03-17 MED ORDER — LORAZEPAM 2 MG/ML IJ SOLN
1.0000 mg | Freq: Once | INTRAMUSCULAR | Status: AC
  Administered 2015-03-17: 1 mg via INTRAVENOUS
  Filled 2015-03-17: qty 1

## 2015-03-17 MED ORDER — ACETAMINOPHEN 325 MG PO TABS: 650.0000 mg | ORAL_TABLET | Freq: Four times a day (QID) | ORAL | Status: DC | PRN

## 2015-03-17 MED ORDER — DEXTROSE 5 % IV SOLN
INTRAVENOUS | Status: AC
  Filled 2015-03-17: qty 500

## 2015-03-17 MED ORDER — POLYETHYLENE GLYCOL 3350 17 G PO PACK: 17.0000 g | PACK | Freq: Every day | ORAL | Status: DC | PRN

## 2015-03-17 MED ORDER — DEXTROSE 5 % IV SOLN
1.0000 g | INTRAVENOUS | Status: DC
  Administered 2015-03-17: 1 g via INTRAVENOUS
  Filled 2015-03-17 (×2): qty 10

## 2015-03-17 MED ORDER — PHENYTOIN SODIUM 50 MG/ML IJ SOLN
100.0000 mg | Freq: Three times a day (TID) | INTRAMUSCULAR | Status: DC
  Administered 2015-03-17 – 2015-03-20 (×8): 100 mg via INTRAVENOUS
  Filled 2015-03-17 (×8): qty 2

## 2015-03-17 MED ORDER — DEXTROSE 5 % IV SOLN
250.0000 mg | INTRAVENOUS | Status: DC
  Filled 2015-03-17: qty 250

## 2015-03-17 MED ORDER — POTASSIUM CHLORIDE IN NACL 40-0.9 MEQ/L-% IV SOLN
INTRAVENOUS | Status: DC
  Administered 2015-03-17 – 2015-03-18 (×2): 125 mL/h via INTRAVENOUS

## 2015-03-17 MED ORDER — IBUPROFEN 400 MG PO TABS: 400.0000 mg | ORAL_TABLET | Freq: Once | ORAL | Status: DC

## 2015-03-17 MED ORDER — ALBUTEROL SULFATE (2.5 MG/3ML) 0.083% IN NEBU: 2.5000 mg | INHALATION_SOLUTION | RESPIRATORY_TRACT | Status: AC | PRN

## 2015-03-17 MED ORDER — ONDANSETRON HCL 4 MG/2ML IJ SOLN: 4.0000 mg | Freq: Four times a day (QID) | INTRAMUSCULAR | Status: DC | PRN

## 2015-03-17 MED ORDER — DEXTROSE 5 % IV SOLN: 1.0000 g | Freq: Once | INTRAVENOUS | Status: DC

## 2015-03-17 MED ORDER — ACETAMINOPHEN 500 MG PO TABS: 1000.0000 mg | ORAL_TABLET | Freq: Once | ORAL | Status: DC

## 2015-03-17 MED ORDER — HALOPERIDOL LACTATE 5 MG/ML IJ SOLN: 5.0000 mg | Freq: Once | INTRAMUSCULAR | Status: DC

## 2015-03-17 MED ORDER — SODIUM CHLORIDE 0.9 % IV SOLN: INTRAVENOUS | Status: DC

## 2015-03-17 MED ORDER — VITAMIN B-1 100 MG PO TABS
100.0000 mg | ORAL_TABLET | Freq: Every day | ORAL | Status: DC
  Administered 2015-03-18 – 2015-03-20 (×3): 100 mg via ORAL
  Filled 2015-03-17 (×3): qty 1

## 2015-03-17 MED ORDER — ONDANSETRON HCL 4 MG PO TABS: 4.0000 mg | ORAL_TABLET | Freq: Four times a day (QID) | ORAL | Status: DC | PRN

## 2015-03-17 MED ORDER — LORAZEPAM 2 MG/ML IJ SOLN: 2.0000 mg | INTRAMUSCULAR | Status: DC | PRN

## 2015-03-17 MED ORDER — DEXTROSE 5 % IV SOLN
INTRAVENOUS | Status: AC
  Filled 2015-03-17: qty 10

## 2015-03-17 MED ORDER — CETYLPYRIDINIUM CHLORIDE 0.05 % MT LIQD
7.0000 mL | Freq: Two times a day (BID) | OROMUCOSAL | Status: DC
  Administered 2015-03-17 – 2015-03-20 (×6): 7 mL via OROMUCOSAL

## 2015-03-17 MED ORDER — ACETAMINOPHEN 650 MG RE SUPP: 650.0000 mg | Freq: Four times a day (QID) | RECTAL | Status: DC | PRN

## 2015-03-17 MED ORDER — PHENYTOIN SODIUM 50 MG/ML IJ SOLN
1500.0000 mg | Freq: Once | INTRAMUSCULAR | Status: AC
  Administered 2015-03-17: 1500 mg via INTRAVENOUS
  Filled 2015-03-17: qty 30

## 2015-03-17 MED ORDER — AZITHROMYCIN 500 MG IV SOLR
500.0000 mg | Freq: Once | INTRAVENOUS | Status: AC
  Administered 2015-03-17: 500 mg via INTRAVENOUS

## 2015-03-17 NOTE — ED Notes (Signed)
Called to room by secretary, pt having seizure.  Blood and sputum coming from mouth.  Suctioned mouth.  EDP was at bedside. Pt incontinent of urine.  Pt postictal at this time.

## 2015-03-17 NOTE — ED Notes (Signed)
4L [place] applied to pt due to 02 sats 88%.

## 2015-03-17 NOTE — ED Notes (Addendum)
Per EMS: Pt reports several witnessed seizures today by family. Pt was bit by a tick a few days ago on leg. Pt post-ictal at this time.  Pt reports seizures started 3 weeks ago, does not take any medication for this.  cbg 121

## 2015-03-17 NOTE — ED Notes (Signed)
Spoke with pharmacy regarding drug interaction.  Vernona Rieger suggested not to run potassium with the antibiotics ordered.

## 2015-03-17 NOTE — ED Notes (Signed)
Family at bedside. 

## 2015-03-17 NOTE — Consult Note (Signed)
Reginald A. [name], MD     [email or web address]          Reginald [name] is an 55 y.o. male.   ASSESSMENT/PLAN: 1. Resolved status epilepticus. 2. Recurrent seizures etiology unclear. Given the patient's history of alcoholism, I suspect that he could have I have alcohol withdrawal seizure but this is not entirely clear at this time. 3. 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 4. Alcoholism    RECOMMENDATION: Aspirin 81 daily. Carotid duplex Doppler and echocardiography. Additional labs for the following: RPR, vitamin B12, homocysteine, thyroid function tests and HIV. The patient is cautioned about the effects of chronic alcoholism.  The patient is a 55 year old black male who presents to the emergency room with seizures. It appears he is had several seizures today about 5 or more. These were generalized tonic-clonic seizures. The patient has had seizures last several months. He has not been worked up extensively for this. There appears to be issues with noncompliance. The patient does consume large amounts of alcohol daily. There probably is no clear history of head injuries or previous strokes per the patient although the MRI showed evidence of subcortical infarcts. No basic history of epilepsy. The patient reports that he is feeling well but he does know why he is in the hospital. Review of systems Limited due to the impaired cognition.  GENERAL: Pleasant man in no acute distress. Does seem to have stigmata of chronic overuse with puffy appearance of the face and extremities.  HEENT: Supple. Atraumatic normocephalic.   ABDOMEN: soft  EXTREMITIES: No edema   BACK: Normal.  SKIN: Normal by inspection.    MENTAL STATUS: He is sleeping but easily arousable. He does follow commands well and is mostly coherent although he can provide a history as to why he is in the hospital.  CRANIAL NERVES: Pupils are equal, round and reactive to light and  accommodation; extra ocular movements are full, there is no significant nystagmus; visual fields are full; upper and lower facial muscles are normal in strength and symmetric, there is no flattening of the nasolabial folds; tongue is midline; uvula is midline; shoulder elevation is normal.  MOTOR: Exact strength is difficult because of lack of cooperation but he has at least 4/5 strength throughout. Bulk and tone are normal throughout.  COORDINATION: Left finger to nose is normal, right finger to nose is normal, No rest tremor; no intention tremor; no postural tremor; no bradykinesia.  REFLEXES: Deep tendon reflexes are symmetrical and normal. Babinski reflexes are flexor bilaterally.   SENSATION: Normal to light touch.   Blood pressure 114/84, pulse 88, temperature 98.1 F (36.7 C), temperature source Oral, resp. rate 17, height '6\' 3"'$  (1.905 m), weight 105.1 kg (231 lb 11.3 oz), SpO2 91 %.  Past Medical History  Diagnosis Date  . Hypertension   . Wears glasses   . Seizures [facility])     Past Surgical History  Procedure Laterality Date  . Umbilical hernia repair  2007  . Tonsillectomy    . Wisdom tooth extraction    . Colonoscopy    . Shoulder arthroscopy with subacromial decompression Right 05/06/2013    Procedure: Right shoulder arthroscopy rotator cuff tear repair, subacromial decompression;  Surgeon: Nita Sells, MD;  Location: [facility];  Service: Orthopedics;  Laterality: Right;  Right shoulder arthroscopy rotator cuff tear repair, subacromial decompression    Family History  Problem Relation Age of Onset  . Hypertension Mother   . Breast cancer Sister  Social History:  reports that he has been smoking.  He does not have any smokeless tobacco history on file. He reports that he drinks alcohol. He reports that he does not use illicit drugs.  Allergies:  Allergies  Allergen Reactions  . Penicillins Itching    Medications: Prior to Admission  medications   Medication Sig Start Date End Date Taking? Authorizing Provider  Amlodipine-Valsartan-HCTZ 10-160-25 MG TABS Take 1 tablet by mouth every morning. 03/04/15  Yes Historical Provider, MD  Omega-3 Fatty Acids (FISH OIL) 1000 MG CAPS Take by mouth.   Yes Historical Provider, MD    Scheduled Meds: . sodium chloride   Intravenous STAT  . [START ON 03/18/2015] azithromycin  250 mg Intravenous Q24H  . azithromycin  500 mg Intravenous Once  . cefTRIAXone (ROCEPHIN)  IV  1 g Intravenous Q24H  . phenytoin (DILANTIN) IV  100 mg Intravenous 3 times per day   Continuous Infusions: . 0.9 % NaCl with KCl 40 mEq / L 125 mL/hr (03/17/15 1858)   PRN Meds:.acetaminophen **OR** acetaminophen, albuterol, ondansetron **OR** ondansetron (ZOFRAN) IV, polyethylene glycol     Results for orders placed or performed during the hospital encounter of 03/17/15 (from the past 48 hour(s))  Acetaminophen level     Status: Abnormal   Collection Time: 03/17/15  2:26 PM  Result Value Ref Range   Acetaminophen (Tylenol), Serum <10 (L) 10 - 30 ug/mL    Comment:        THERAPEUTIC CONCENTRATIONS VARY SIGNIFICANTLY. A RANGE OF 10-30 ug/mL MAY BE AN EFFECTIVE CONCENTRATION FOR MANY PATIENTS. HOWEVER, SOME ARE BEST TREATED AT CONCENTRATIONS OUTSIDE THIS RANGE. ACETAMINOPHEN CONCENTRATIONS >150 ug/mL AT 4 HOURS AFTER INGESTION AND >50 ug/mL AT 12 HOURS AFTER INGESTION ARE OFTEN ASSOCIATED WITH TOXIC REACTIONS.   Comprehensive metabolic panel     Status: Abnormal   Collection Time: 03/17/15  2:26 PM  Result Value Ref Range   Sodium 140 135 - 145 mmol/L   Potassium 2.9 (L) 3.5 - 5.1 mmol/L   Chloride 105 101 - 111 mmol/L   CO2 19 (L) 22 - 32 mmol/L   Glucose, Bld 147 (H) 65 - 99 mg/dL   BUN 11 6 - 20 mg/dL   Creatinine, Ser 1.18 0.61 - 1.24 mg/dL   Calcium 8.9 8.9 - 10.3 mg/dL   Total Protein 7.7 6.5 - 8.1 g/dL   Albumin 4.0 3.5 - 5.0 g/dL   AST 45 (H) 15 - 41 U/L   ALT <5 (L) 17 - 63 U/L    Alkaline Phosphatase 67 38 - 126 U/L   Total Bilirubin 0.3 0.3 - 1.2 mg/dL   GFR calc non Af Amer >60 >60 mL/min   GFR calc Af Amer >60 >60 mL/min    Comment: (NOTE) The eGFR has been calculated using the CKD EPI equation. This calculation has not been validated in all clinical situations. eGFR's persistently <60 mL/min signify possible Chronic Kidney Disease.    Anion gap 16 (H) 5 - 15  Ethanol     Status: None   Collection Time: 03/17/15  2:26 PM  Result Value Ref Range   Alcohol, Ethyl (B) <5 <5 mg/dL    Comment:        LOWEST DETECTABLE LIMIT FOR SERUM ALCOHOL IS 5 mg/dL FOR MEDICAL PURPOSES ONLY   Salicylate level     Status: None   Collection Time: 03/17/15  2:26 PM  Result Value Ref Range   Salicylate Lvl <7.8 2.8 - 30.0 mg/dL  Troponin I     Status: None   Collection Time: 03/17/15  2:26 PM  Result Value Ref Range   Troponin I <0.03 <0.031 ng/mL    Comment:        NO INDICATION OF MYOCARDIAL INJURY.   CBC with Differential     Status: Abnormal   Collection Time: 03/17/15  2:26 PM  Result Value Ref Range   WBC 17.1 (H) 4.0 - 10.5 K/uL   RBC 5.33 4.22 - 5.81 MIL/uL   Hemoglobin 13.8 13.0 - 17.0 g/dL   HCT 43.1 39.0 - 52.0 %   MCV 80.9 78.0 - 100.0 fL   MCH 25.9 (L) 26.0 - 34.0 pg   MCHC 32.0 30.0 - 36.0 g/dL   RDW 16.2 (H) 11.5 - 15.5 %   Platelets 419 (H) 150 - 400 K/uL   Neutrophils Relative % 84 %   Neutro Abs 14.4 (H) 1.7 - 7.7 K/uL   Lymphocytes Relative 8 %   Lymphs Abs 1.3 0.7 - 4.0 K/uL   Monocytes Relative 8 %   Monocytes Absolute 1.3 (H) 0.1 - 1.0 K/uL   Eosinophils Relative 0 %   Eosinophils Absolute 0.0 0.0 - 0.7 K/uL   Basophils Relative 0 %   Basophils Absolute 0.0 0.0 - 0.1 K/uL  Urinalysis, Routine w reflex microscopic     Status: Abnormal   Collection Time: 03/17/15  4:00 PM  Result Value Ref Range   Color, Urine YELLOW YELLOW   APPearance CLEAR CLEAR   Specific Gravity, Urine 1.020 1.005 - 1.030   pH 5.5 5.0 - 8.0   Glucose, UA  NEGATIVE NEGATIVE mg/dL   Hgb urine dipstick TRACE (A) NEGATIVE   Bilirubin Urine NEGATIVE NEGATIVE   Ketones, ur NEGATIVE NEGATIVE mg/dL   Protein, ur NEGATIVE NEGATIVE mg/dL   Nitrite NEGATIVE NEGATIVE   Leukocytes, UA NEGATIVE NEGATIVE  Urine rapid drug screen (hosp performed)     Status: Abnormal   Collection Time: 03/17/15  4:00 PM  Result Value Ref Range   Opiates NONE DETECTED NONE DETECTED   Cocaine NONE DETECTED NONE DETECTED   Benzodiazepines NONE DETECTED NONE DETECTED   Amphetamines NONE DETECTED NONE DETECTED   Tetrahydrocannabinol POSITIVE (A) NONE DETECTED   Barbiturates NONE DETECTED NONE DETECTED    Comment:        DRUG SCREEN FOR MEDICAL PURPOSES ONLY.  IF CONFIRMATION IS NEEDED FOR ANY PURPOSE, NOTIFY LAB WITHIN 5 DAYS.        LOWEST DETECTABLE LIMITS FOR URINE DRUG SCREEN Drug Class       Cutoff (ng/mL) Amphetamine      1000 Barbiturate      200 Benzodiazepine   967 Tricyclics       893 Opiates          300 Cocaine          300 THC              50   Urine microscopic-add on     Status: Abnormal   Collection Time: 03/17/15  4:00 PM  Result Value Ref Range   Squamous Epithelial / LPF 0-5 (A) NONE SEEN   WBC, UA 0-5 0 - 5 WBC/hpf   RBC / HPF 0-5 0 - 5 RBC/hpf   Bacteria, UA FEW (A) NONE SEEN    Studies/Results:  MRI BRAIN 1. Asymmetric right-sided T2 hyperintensities in the anterior right frontal lobe and right cerebellum. These most likely related to remote ischemia. Asymmetric demyelinating disease is considered  less likely. Given the patient's recent seizures, MRI with contrast would be useful in the evaluation of possible underlying neoplasm. 2. No acute intracranial abnormality. 3. Mild sinus disease.   The patient's brain MRI is reviewed in person. There is no acute lesions seen. There is increased signal involving the right frontal region in a wedge-shaped wonders distribution but mostly limited to the deep white matter and not involving  the cortical region. This is seen on FLAIR imaging and I do not see corresponding reduced signal on T1. There is a similar and somewhat larger lesion involving the right inferior cerebellum region associated with increased signal on FLAIR imaging and reduced signal/encephalomalacia involving the T1 sequence. No hemorrhage appreciated. There is mild periventricular white matter disease that seems appropriate for age.    Martavia Tye A. Merlene [name], M.D.  Diplomate, Tax adviser of Psychiatry and Neurology ( Neurology). 03/17/2015, 7:02 PM

## 2015-03-17 NOTE — ED Provider Notes (Signed)
CSN: [id number]     Arrival date & time 03/17/15  1333 History   First MD Initiated Contact with Patient 03/17/15 1404     Chief Complaint  Patient presents with  . Seizures      Patient is a 55 y.o. male presenting with seizures. The history is provided by the spouse. The history is limited by the condition of the patient (AMS).  Seizures Pt was seen at 1400. Per pt's wife: Pt's wife called EMS for pt having seizures x3 this morning. Pt's wife states pt had his first generalized tonic-clonic seizure approximately 0730 this morning. She states this lasted approximately 10 minutes before spontaneously resolving. Pt's family states pt then "had 2 more seizures." Pt's wife states pt has had intermittent seizures for the past 1 year, with last seizures approximately 12/2014. Pt's wife states pt "won't go to a Neurologist." EMS stated pt was post-ictal en route, CBG 121.    Past Medical History  Diagnosis Date  . Hypertension   . Wears glasses    Past Surgical History  Procedure Laterality Date  . Umbilical hernia repair  2007  . Tonsillectomy    . Wisdom tooth extraction    . Colonoscopy    . Shoulder arthroscopy with subacromial decompression Right 05/06/2013    Procedure: Right shoulder arthroscopy rotator cuff tear repair, subacromial decompression;  Surgeon: Mable Paris, MD;  Location: [facility];  Service: Orthopedics;  Laterality: Right;  Right shoulder arthroscopy rotator cuff tear repair, subacromial decompression    Social History  Substance Use Topics  . Smoking status: Current Every Day Smoker -- 0.25 packs/day  . Smokeless tobacco: None  . Alcohol Use: Yes     Comment: occ    Review of Systems  Unable to perform ROS: Mental status change  Neurological: Positive for seizures.      Allergies  Penicillins  Home Medications   Prior to Admission medications   Medication Sig Start Date End Date Taking? Authorizing Provider   Amlodipine-Valsartan-HCTZ 10-160-25 MG TABS Take 1 tablet by mouth every morning. 03/04/15  Yes Historical Provider, MD  Omega-3 Fatty Acids (FISH OIL) 1000 MG CAPS Take by mouth.   Yes Historical Provider, MD   BP 120/77 mmHg  Pulse 124  Temp(Src) 98.1 F (36.7 C) (Oral)  Resp 21  Ht 6\' 3"  (1.905 m)  Wt 225 lb (102.059 kg)  BMI 28.12 kg/m2  SpO2 93%   Patient Vitals for the past 24 hrs:  BP Temp Temp src Pulse Resp SpO2 Height Weight  03/17/15 1523 - - - (!) 124 - 93 % - -  03/17/15 1445 120/77 mmHg - - 71 21 90 % - -  03/17/15 1430 140/87 mmHg - - - 26 - - -  03/17/15 1405 119/69 mmHg - - 100 22 97 % - -  03/17/15 1400 - - - 100 19 96 % - -  03/17/15 1337 118/68 mmHg 98.1 F (36.7 C) Oral 93 16 93 % 6\' 3"  (1.905 m) 225 lb (102.059 kg)    Physical Exam  1405: Physical examination:  Nursing notes reviewed; Vital signs and O2 SAT reviewed;  Constitutional: Well developed, Well nourished, Well hydrated.; Head:  Normocephalic, atraumatic; Eyes: EOMI, PERRL, No scleral icterus; ENMT: Mouth and pharynx normal, Mucous membranes moist; Neck: Supple, Full range of motion; Cardiovascular: Tachycardic rate and rhythm, No gallop; Respiratory: Breath sounds clear & equal bilaterally, No wheezes. Normal respiratory effort/excursion; Chest: Nontender, Movement normal; Abdomen: Soft, Nontender, Nondistended, Normal  bowel sounds; Genitourinary: +incont urine.; Extremities: Pulses normal, No deformity, No edema, No calf edema or asymmetry.; Neuro: Laying eyes closed, unresponsive. No facial droop. +generalized tonic-clonic seizure..; Skin: Color normal, Warm, Dry.   ED Course  Procedures (including critical care time) Labs Review  Imaging Review  I have personally reviewed and evaluated these images and lab results as part of my medical decision-making.   EKG Interpretation   Date/Time:  Tuesday March 17 2015 13:34:52 EST Ventricular Rate:  90 PR Interval:  168 QRS Duration: 101 QT  Interval:  363 QTC Calculation: 444 R Axis:   73 Text Interpretation:  Sinus rhythm Atrial premature complex Baseline  wander When compared with ECG of 05/02/2013 Premature atrial complexes is  now Present Otherwise no significant change Confirmed by [facility]  MD,  Nicholos Johns [phone]) on 03/17/2015 2:14:15 PM      MDM  MDM Reviewed: previous chart, nursing note and vitals Reviewed previous: labs and ECG Interpretation: labs, ECG, x-ray, MRI and CT scan Total time providing critical care: 30-74 minutes. This excludes time spent performing separately reportable procedures and services. Consults: admitting MD     CRITICAL CARE Performed by: Laray Anger Total critical care time: 45 minutes Critical care time was exclusive of separately billable procedures and treating other patients. Critical care was necessary to treat or prevent imminent or life-threatening deterioration. Critical care was time spent personally by me on the following activities: development of treatment plan with patient and/or surrogate as well as nursing, discussions with consultants, evaluation of patient's response to treatment, examination of patient, obtaining history from patient or surrogate, ordering and performing treatments and interventions, ordering and review of laboratory studies, ordering and review of radiographic studies, pulse oximetry and re-evaluation of patient's condition.   Results for orders placed or performed during the hospital encounter of 03/17/15  Acetaminophen level  Result Value Ref Range   Acetaminophen (Tylenol), Serum <10 (L) 10 - 30 ug/mL  Comprehensive metabolic panel  Result Value Ref Range   Sodium 140 135 - 145 mmol/L   Potassium 2.9 (L) 3.5 - 5.1 mmol/L   Chloride 105 101 - 111 mmol/L   CO2 19 (L) 22 - 32 mmol/L   Glucose, Bld 147 (H) 65 - 99 mg/dL   BUN 11 6 - 20 mg/dL   Creatinine, Ser 1.91 0.61 - 1.24 mg/dL   Calcium 8.9 8.9 - 47.8 mg/dL   Total Protein 7.7 6.5 -  8.1 g/dL   Albumin 4.0 3.5 - 5.0 g/dL   AST 45 (H) 15 - 41 U/L   ALT <5 (L) 17 - 63 U/L   Alkaline Phosphatase 67 38 - 126 U/L   Total Bilirubin 0.3 0.3 - 1.2 mg/dL   GFR calc non Af Amer >60 >60 mL/min   GFR calc Af Amer >60 >60 mL/min   Anion gap 16 (H) 5 - 15  Ethanol  Result Value Ref Range   Alcohol, Ethyl (B) <5 <5 mg/dL  Salicylate level  Result Value Ref Range   Salicylate Lvl <4.0 2.8 - 30.0 mg/dL  Troponin I  Result Value Ref Range   Troponin I <0.03 <0.031 ng/mL  CBC with Differential  Result Value Ref Range   WBC 17.1 (H) 4.0 - 10.5 K/uL   RBC 5.33 4.22 - 5.81 MIL/uL   Hemoglobin 13.8 13.0 - 17.0 g/dL   HCT 29.5 62.1 - 30.8 %   MCV 80.9 78.0 - 100.0 fL   MCH 25.9 (L) 26.0 - 34.0 pg  MCHC 32.0 30.0 - 36.0 g/dL   RDW 09.8 (H) 11.9 - 14.7 %   Platelets 419 (H) 150 - 400 K/uL   Neutrophils Relative % 84 %   Neutro Abs 14.4 (H) 1.7 - 7.7 K/uL   Lymphocytes Relative 8 %   Lymphs Abs 1.3 0.7 - 4.0 K/uL   Monocytes Relative 8 %   Monocytes Absolute 1.3 (H) 0.1 - 1.0 K/uL   Eosinophils Relative 0 %   Eosinophils Absolute 0.0 0.0 - 0.7 K/uL   Basophils Relative 0 %   Basophils Absolute 0.0 0.0 - 0.1 K/uL  Urinalysis, Routine w reflex microscopic  Result Value Ref Range   Color, Urine YELLOW YELLOW   APPearance CLEAR CLEAR   Specific Gravity, Urine 1.020 1.005 - 1.030   pH 5.5 5.0 - 8.0   Glucose, UA NEGATIVE NEGATIVE mg/dL   Hgb urine dipstick TRACE (A) NEGATIVE   Bilirubin Urine NEGATIVE NEGATIVE   Ketones, ur NEGATIVE NEGATIVE mg/dL   Protein, ur NEGATIVE NEGATIVE mg/dL   Nitrite NEGATIVE NEGATIVE   Leukocytes, UA NEGATIVE NEGATIVE  Urine rapid drug screen (hosp performed)  Result Value Ref Range   Opiates NONE DETECTED NONE DETECTED   Cocaine NONE DETECTED NONE DETECTED   Benzodiazepines NONE DETECTED NONE DETECTED   Amphetamines NONE DETECTED NONE DETECTED   Tetrahydrocannabinol POSITIVE (A) NONE DETECTED   Barbiturates NONE DETECTED NONE DETECTED   Urine microscopic-add on  Result Value Ref Range   Squamous Epithelial / LPF 0-5 (A) NONE SEEN   WBC, UA 0-5 0 - 5 WBC/hpf   RBC / HPF 0-5 0 - 5 RBC/hpf   Bacteria, UA FEW (A) NONE SEEN   Ct Head Wo Contrast 03/17/2015  CLINICAL DATA:  Witnessed seizure earlier today. Several recent seizures by report EXAM: CT HEAD WITHOUT CONTRAST TECHNIQUE: Contiguous axial images were obtained from the base of the skull through the vertex without intravenous contrast. COMPARISON:  None. FINDINGS: The ventricles are normal in size and configuration. There is no intracranial hemorrhage, extra-axial fluid collection, or midline shift. There is a focal area of decreased attenuation in the mid right cerebellum slightly inferior and posterior to the right dentate nucleus, consistent with a prior small infarct. There is small vessel disease in the anterior right centrum semiovale. There is a focal area of decreased attenuation at the gray -white junction of the mid right frontal lobe, best seen on axial slice 19 series 2, consistent with either a recent infarct or possibly a focal area of edema from a small mass lesion. No other findings suggesting potential mass are appreciable. Elsewhere gray-white compartments appear normal. The bony calvarium appears intact. The mastoid air cells are clear. Visualized orbital regions appear symmetric bilaterally. There is mild mucosal thickening in the right maxillary antrum. There is mucosal thickening in several ethmoid air cells bilaterally. IMPRESSION: Focal area of decreased attenuation at the gray - white junction of the mid right frontal lobe measuring 1.4 x 1.1 cm, best seen on axial slice 19 series 2. Question small recent infarct or possibly edema from a small mass, particularly given the history. This finding may warrant pre and post-contrast MR to further assess. Prior infarct in the posterior mid right cerebellum. Focal small vessel disease in the anterior right centrum  semiovale. Mild paranasal sinus disease. Electronically Signed   By: Bretta Bang III M.D.   On: 03/17/2015 15:12   Mr Brain Wo Contrast (neuro Protocol) 03/17/2015  CLINICAL DATA:  Multiple seizures.  Abnormal CT  scan. EXAM: MRI HEAD WITHOUT CONTRAST TECHNIQUE: Multiplanar, multiecho pulse sequences of the brain and surrounding structures were obtained without intravenous contrast. COMPARISON:  None. FINDINGS: Asymmetric right-sided periventricular white matter changes present. There 2 focal subcortical white matter lesions anteriorly on the right as well. No significant left-sided white matter disease is present. A focal T2 hyperintensity is also present within the right cerebellum. The diffusion-weighted images demonstrate no evidence for acute infarction. No acute hemorrhage or mass lesion is present. The internal auditory canals are within normal limits bilaterally. Flow is present in the major intracranial arteries. The globes and orbits are intact. Mild mucosal thickening is present in the ethmoid air cells and maxillary sinuses bilaterally. The skullbase is within normal limits. Midline sagittal images are unremarkable. IMPRESSION: 1. Asymmetric right-sided T2 hyperintensities in the anterior right frontal lobe and right cerebellum. These most likely related to remote ischemia. Asymmetric demyelinating disease is considered less likely. Given the patient's recent seizures, MRI with contrast would be useful in the evaluation of possible underlying neoplasm. 2. No acute intracranial abnormality. 3. Mild sinus disease. Electronically Signed   By: Marin Roberts M.D.   On: 03/17/2015 16:40   Dg Chest Port 1 View 03/17/2015  CLINICAL DATA:  Multiple seizures today EXAM: PORTABLE CHEST 1 VIEW COMPARISON:  11/30/2005 FINDINGS: Borderline cardiomegaly. Central mild vascular congestion without convincing pulmonary edema. Hazy right middle lobe and right base medially atelectasis or infiltrate. Mild  degenerative changes thoracic spine IMPRESSION: Borderline cardiomegaly. Hazy right middle lobe and right base medially atelectasis or early infiltrate. Central mild vascular congestion without convincing pulmonary edema. Electronically Signed   By: Natasha Mead M.D.   On: 03/17/2015 14:42    1410:  Pt had generalized tonic-clonic seizure during my exam. Pt now post-ictal. VS are stable. Will dose IV ativan, and IV dilantin load while workup progresss.  1525:  Pt now awake/alert, confused, combative with ED staff, attempting to get out of bed. Will dose 2nd IV ativan.  1545:  Soft restrains applied until ativan reaches full effect.   1645:  Pt sleeping soundly. VSS, resps easy.   1700:  No further seizure activity while in the ED. Pt now awake, calmer. Potassium repleted IV. IV abx started for CAP.  Dx and testing d/w pt and family.  Questions answered.  Verb understanding, agreeable to admit. T/C to [facility]. Arlean Hopping, case discussed, including:  HPI, pertinent PM/SHx, VS/PE, dx testing, ED course and treatment:  Agreeable to admit, requests to write temporary orders, obtain stepdown bed to team APAdmits.     Samuel Jester, DO 03/20/15 0007

## 2015-03-17 NOTE — H&P (Addendum)
[facility] History and Physical  Reginald [name] ZOX:[id number] DOB: [birth year] DOA: 03/17/2015  Referring physician: Dr Clarene Duke PCP: Sissy Hoff, MD   Chief Complaint: Seizures  HPI: Reginald [name] is a 55 y.o. male with hx of HTN and seizure disorder.  He hasn't followed up with his doctors and hasn't been taking any seizure medication for some time.  He had seizures at home this AM about 9:30.  He had more seizures in the ED.  Seizures are tonic-clonic as described by family reportedly.  In the ED he rec'd IV Ativan and IV dilantin loading dose.  He is drowsy now, asked to see for admission.     The wife says he starting having seizures around November 2016.  Had one in November and another one in December, and then today.  In November his BP was very high and he was treated in the home and not taken to the hospital . In December she says it was a "light seizure" and she did what EMS did the last time (gave him BP pill) and he had a second seizure then he was ok.  She didn't know what his BP was that time. This morning the seizures were about the same in severity as the other two times but it took him longer to recover.  Has had 5 seziures today according to pts wife, including one here.    Not taking seizure meds, never saw a neurologist.  No headaches, blurry vision, paralysis.  No hx CVA.  Does take some marijuana, drinks "a little bourbon in his coffee" , about half a cup of bourbon a day.  No beer or wine.  No head trauma but did fall after first seizure.  Takes BP pill and no other medication. No hx heart disease.  +tobacco , no hx COPD/ emphysema , or other resp problems.  Dr Azucena Cecil in [place] is his PCP, sees him every 3-6 months.  Pt drinks bourbon in his coffee , total of about 1/2 cup of bourbon per day.  No beer or wine.  No hx job or social issues related to etoh.   Head CT here showed some poss lesion R anterior cortex, MRI done w results pending.     No prior hospital  admissions here. Nothing in Care Everywhere.  Home meds    ROS per wife  denies CP  no joint pain   no HA  no blurry vision  no rash  no diarrhea  no nausea/ vomiting  no dysuria  no difficulty voiding  no change in urine color    Where does patient live home w wife Can patient participate in ADLs? yes  Past Medical History  Past Medical History  Diagnosis Date  . Hypertension   . Wears glasses    Past Surgical History  Past Surgical History  Procedure Laterality Date  . Umbilical hernia repair  2007  . Tonsillectomy    . Wisdom tooth extraction    . Colonoscopy    . Shoulder arthroscopy with subacromial decompression Right 05/06/2013    Procedure: Right shoulder arthroscopy rotator cuff tear repair, subacromial decompression;  Surgeon: Mable Paris, MD;  Location: [facility];  Service: Orthopedics;  Laterality: Right;  Right shoulder arthroscopy rotator cuff tear repair, subacromial decompression   Family History History reviewed. No pertinent family history. Social History  reports that he has been smoking.  He does not have any smokeless tobacco history on file. He reports that he drinks alcohol.  He reports that he does not use illicit drugs. Allergies  Allergies  Allergen Reactions  . Penicillins Itching    Has patient had a PCN reaction causing immediate rash, facial/tongue/throat swelling, SOB or lightheadedness with hypotension: Nono Has patient had a PCN reaction causing severe rash involving mucus membranes or skin necrosis: Nono Has patient had a PCN reaction that required hospitalization Nono Has patient had a PCN reaction occurring within the last 10 years: Nono If all of the above answers are "NO", then may proceed with Cephalosporin    Home medications Prior to Admission medications   Medication Sig Start Date End Date Taking? Authorizing Provider  Amlodipine-Valsartan-HCTZ 10-160-25 MG TABS Take 1 tablet by mouth every morning.  03/04/15  Yes Historical Provider, MD  Omega-3 Fatty Acids (FISH OIL) 1000 MG CAPS Take by mouth.   Yes Historical Provider, MD   Liver Function Tests  Recent Labs Lab 03/17/15 1426  AST 45*  ALT <5*  ALKPHOS 67  BILITOT 0.3  PROT 7.7  ALBUMIN 4.0   No results for input(s): LIPASE, AMYLASE in the last 168 hours. CBC  Recent Labs Lab 03/17/15 1426  WBC 17.1*  NEUTROABS 14.4*  HGB 13.8  HCT 43.1  MCV 80.9  PLT 419*   Basic Metabolic Panel  Recent Labs Lab 03/17/15 1426  NA 140  K 2.9*  CL 105  CO2 19*  GLUCOSE 147*  BUN 11  CREATININE 1.18  CALCIUM 8.9     Filed Vitals:   03/17/15 1523 03/17/15 1645 03/17/15 1648 03/17/15 1715  BP:  128/78  132/78  Pulse: 124 92 97 86  Temp:      TempSrc:      Resp:  24 18 19   Height:      Weight:      SpO2: 93%  94% 94%   Exam: Lethargic, opens eyes and follows simple commands, "hospital" No rash, cyanosis or gangrene Sclera anicteric, throat clear No jvd or meningismus Chest clear bilat RRR no MRG aBd soft ntnd no mass obese +bs GU normal male MS no joint effusion/ deform Ext no LE or UE edema, no ulcers/ wounds Neuro moves all 4 ext symmetric to commands No focal weakness, CN's grossly intact, not following all commands   CT head > area decreased attenuation gray-white junction 1.4 x 1.1 cm, question small infarct / small mass w edema.  Rec MR w pre, post contrast if further assessment needed.   Na 140 K 2.9  BUN 11 Creat 1.18  Glu 147  Alb 4  AST 45  ALT <5  Tbili 0.3  WBC 17k  Hb 13 plt 419 UA negative   Drug screen THC+ CXR > possible early RLL infiltrate   Assessment: 1. Seizures, grand mal - unprovoked, generalized, no focal deficits , possible lesion on CT / MR. Hx of seizures started 3-4 mos ago, 2 other episodes but never taken to hospital for them. Drinks etoh but no hx admission for etoh problems.  Got Ativan and IV dilantin load in ED. Admit to ICU, cont IV dilantin.  2. HTN - BP's normal, hold  BP meds for now' 3. Cough / RLL early infiltrate- poss aspiration PNA, started abx in ED 4. Brain lesion - possible cause of seizures. Get neuro input as to next step.  5. Leukocytosis - get blood cx's, prob due to acute stress reaction from seizures  Plan - IV dilantin, ICU, IVF', neuro consult , IV abx   DVT Prophylaxis SCD's  Code Status: full  Family Communication: at bedside   Disposition Plan: home when better    Maree Krabbe [facility] Pager [phone]  Cell [phone]  If 7PM-7AM, please contact night-coverage [email or web address] Password [facility] 03/17/2015, 5:47 PM

## 2015-03-17 NOTE — ED Notes (Signed)
Soft restraints placed by Arley Garant and leslie cardwell to bilateral wrists per dr. Magdalene Molly.

## 2015-03-17 NOTE — ED Notes (Signed)
Pt attempting to get out of bed, pt wife standing at edge of bed letting pt push himself back into bed.  Pt continues to attempt to get out of bed.

## 2015-03-17 NOTE — ED Notes (Signed)
Padded side rails, suction set up at bedside. Airway intact.

## 2015-03-17 NOTE — ED Notes (Signed)
Pt returned from CT/MRI. Family at bedside.

## 2015-03-17 NOTE — ED Notes (Signed)
restaints removed, pt calm and sleeping at this time.

## 2015-03-17 NOTE — ED Notes (Signed)
Attempted report x1. 

## 2015-03-18 ENCOUNTER — Inpatient Hospital Stay (HOSPITAL_COMMUNITY)
Admit: 2015-03-18 | Discharge: 2015-03-18 | Disposition: A | Discharge status: Home / Self Care | Payer: BLUE CROSS/BLUE SHIELD | Attending: Neurology | Admitting: Neurology

## 2015-03-18 ENCOUNTER — Other Ambulatory Visit (HOSPITAL_COMMUNITY): Payer: Self-pay

## 2015-03-18 ENCOUNTER — Inpatient Hospital Stay (HOSPITAL_COMMUNITY): Payer: BLUE CROSS/BLUE SHIELD

## 2015-03-18 DIAGNOSIS — G40409 Other generalized epilepsy and epileptic syndromes, not intractable, without status epilepticus: Secondary | ICD-10-CM

## 2015-03-18 DIAGNOSIS — R569 Unspecified convulsions: Secondary | ICD-10-CM

## 2015-03-18 DIAGNOSIS — G939 Disorder of brain, unspecified: Secondary | ICD-10-CM

## 2015-03-18 DIAGNOSIS — Z8679 Personal history of other diseases of the circulatory system: Secondary | ICD-10-CM

## 2015-03-18 DIAGNOSIS — I6350 Cerebral infarction due to unspecified occlusion or stenosis of unspecified cerebral artery: Secondary | ICD-10-CM

## 2015-03-18 DIAGNOSIS — E876 Hypokalemia: Secondary | ICD-10-CM

## 2015-03-18 LAB — BASIC METABOLIC PANEL
ANION GAP: 9 (ref 5–15)
BUN: 8 mg/dL (ref 6–20)
CHLORIDE: 103 mmol/L (ref 101–111)
CO2: 27 mmol/L (ref 22–32)
Calcium: 8.8 mg/dL — ABNORMAL LOW (ref 8.9–10.3)
Creatinine, Ser: 0.85 mg/dL (ref 0.61–1.24)
GFR calc non Af Amer: >60 mL/min (ref >60)
Glucose, Bld: 97 mg/dL (ref 65–99)
Potassium: 3.4 mmol/L — ABNORMAL LOW (ref 3.5–5.1)
Sodium: 139 mmol/L (ref 135–145)

## 2015-03-18 LAB — CBC
HEMATOCRIT: 40.7 % (ref 39.0–52.0)
HEMOGLOBIN: 13.3 g/dL (ref 13.0–17.0)
MCH: 25.9 pg — AB (ref 26.0–34.0)
MCHC: 32.7 g/dL (ref 30.0–36.0)
MCV: 79.3 fL (ref 78.0–100.0)
Platelets: 416 10*3/uL — ABNORMAL HIGH (ref 150–400)
RBC: 5.13 MIL/uL (ref 4.22–5.81)
RDW: 15.9 % — ABNORMAL HIGH (ref 11.5–15.5)
WBC: 17.9 10*3/uL — ABNORMAL HIGH (ref 4.0–10.5)

## 2015-03-18 LAB — MAGNESIUM: Magnesium: 2.4 mg/dL (ref 1.7–2.4)

## 2015-03-18 LAB — VITAMIN B12: VITAMIN B 12: 464 pg/mL (ref 180–914)

## 2015-03-18 MED ORDER — POTASSIUM CHLORIDE CRYS ER 20 MEQ PO TBCR
40.0000 meq | EXTENDED_RELEASE_TABLET | Freq: Every day | ORAL | Status: DC
  Administered 2015-03-18 – 2015-03-20 (×3): 40 meq via ORAL
  Filled 2015-03-18 (×4): qty 2

## 2015-03-18 MED ORDER — AMLODIPINE BESYLATE 5 MG PO TABS
5.0000 mg | ORAL_TABLET | Freq: Every day | ORAL | Status: DC
  Administered 2015-03-18 – 2015-03-20 (×3): 5 mg via ORAL
  Filled 2015-03-18 (×3): qty 1

## 2015-03-18 MED ORDER — LEVOFLOXACIN 500 MG PO TABS
500.0000 mg | ORAL_TABLET | Freq: Every day | ORAL | Status: DC
  Administered 2015-03-18 – 2015-03-19 (×2): 500 mg via ORAL
  Filled 2015-03-18 (×2): qty 1

## 2015-03-18 MED ORDER — NICOTINE 14 MG/24HR TD PT24
14.0000 mg | MEDICATED_PATCH | Freq: Every day | TRANSDERMAL | Status: DC
  Administered 2015-03-18 – 2015-03-20 (×3): 14 mg via TRANSDERMAL
  Filled 2015-03-18 (×3): qty 1

## 2015-03-18 NOTE — Progress Notes (Signed)
EEG completed; results pending.    

## 2015-03-18 NOTE — Care Management Note (Signed)
Case Management Note  Patient Details  Name: Reginald [name] MRN: [id number] Date of Birth: 1960-12-20  Subjective/Objective:                  Pt admitted for seizures. Pt is from home, lives with wife and is ind with ADL's. Pt is employed, has PCP, transportation and no problems affording medications. Pt plans to return home with self care at DC. Pt has ambulated with RN around nursing unit. Pt will need home O2 eval prior to DC.   Action/Plan: Home O2 eval prior to DC. Will cont to follow.   Expected Discharge Date:     03/18/2015             Expected Discharge Plan:  Home/Self Care  In-House Referral:  NA  Discharge planning Services  CM Consult  Post Acute Care Choice:  NA Choice offered to:  NA  DME Arranged:    DME Agency:     HH Arranged:    HH Agency:     Status of Service:  In process, will continue to follow  Medicare Important Message Given:    Date Medicare IM Given:    Medicare IM give by:    Date Additional Medicare IM Given:    Additional Medicare Important Message give by:     If discussed at Long Length of Stay Meetings, dates discussed:    Additional Comments:  Malcolm Metro, RN 03/18/2015, 3:29 PM

## 2015-03-18 NOTE — Progress Notes (Signed)
Patient ID: Reginald [name], male   DOB: 1960-03-24, 55 y.o.   MRN: [id number]  [place] A. Reginald Laughter, MD     [email or web address]          Reginald [name] is an 55 y.o. male.   Assessment/Plan: 1. Resolved status epilepticus. 2. Recurrent seizures etiology unclear. Given the patient's history of alcoholism, I suspect that he could have I have alcohol withdrawal seizure but this is not entirely clear at this time. 3. 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 4. Alcoholism    RECOMMENDATION:  Coated 81 mg aspirin 2 daily. Seizure precautions and no driving for now. Follow-up in 6-8 weeks.     The patient is a lot better today. His wife is present today. They report that he has taken aspirin 81 mg but when he takes his daily has blood in stool. This is a uncoated aspirin however. The wife reports that the patient actually does not consume large doses of alcohol. He apparently drinks 1 shot of alcohol daily.    GENERAL: Pleasant man in no acute distress. Does seem to have stigmata of chronic overuse with puffy appearance of the face and extremities.  HEENT: Supple. Atraumatic normocephalic.   ABDOMEN: soft  EXTREMITIES: No edema   BACK: Normal.  SKIN: Normal by inspection.   MENTAL STATUS: Alert and lucud.   CRANIAL NERVES: Pupils are equal, round and reactive to light and accommodation; extra ocular movements are full, there is no significant nystagmus; visual fields are full; upper and lower facial muscles are normal in strength and symmetric, there is no flattening of the nasolabial folds; tongue is midline; uvula is midline; shoulder elevation is normal.  MOTOR: Exact strength is difficult because of lack of cooperation but he has at least 4/5 strength throughout. Bulk and tone are normal throughout.  COORDINATION: Left finger to nose is normal, right finger to nose is normal, No rest tremor; no intention tremor; no postural tremor; no  bradykinesia..   SENSATION: Normal to light touch.     Objective: Vital signs in last 24 hours: Temp:  [97.7 F (36.5 C)-98.8 F (37.1 C)] 98.5 F (36.9 C) (03/01 1559) Pulse Rate:  [67-97] 69 (03/01 1400) Resp:  [0-22] 8 (03/01 1400) BP: (103-145)/(61-90) 118/83 mmHg (03/01 1400) SpO2:  [89 %-97 %] 94 % (03/01 1400) Weight:  [105.4 kg (232 lb 5.8 oz)] 105.4 kg (232 lb 5.8 oz) (03/01 0500)  Intake/Output from previous day: 02/28 0701 - 03/01 0700 In: 1544.2 [P.O.:240; I.V.:1254.2; IV Piggyback:50] Out: 600 [Urine:600] Intake/Output this shift: Total I/O In: 980 [P.O.:480; Other:500] Out: 1000 [Urine:1000] Nutritional status: Diet Heart Room service appropriate?: Yes; Fluid consistency:: Thin   Lab Results: Results for orders placed or performed during the hospital encounter of 03/17/15 (from the past 48 hour(s))  Acetaminophen level     Status: Abnormal   Collection Time: 03/17/15  2:26 PM  Result Value Ref Range   Acetaminophen (Tylenol), Serum <10 (L) 10 - 30 ug/mL    Comment:        THERAPEUTIC CONCENTRATIONS VARY SIGNIFICANTLY. A RANGE OF 10-30 ug/mL MAY BE AN EFFECTIVE CONCENTRATION FOR MANY PATIENTS. HOWEVER, SOME ARE BEST TREATED AT CONCENTRATIONS OUTSIDE THIS RANGE. ACETAMINOPHEN CONCENTRATIONS >150 ug/mL AT 4 HOURS AFTER INGESTION AND >50 ug/mL AT 12 HOURS AFTER INGESTION ARE OFTEN ASSOCIATED WITH TOXIC REACTIONS.   Comprehensive metabolic panel     Status: Abnormal   Collection Time: 03/17/15  2:26 PM  Result Value Ref Range  Sodium 140 135 - 145 mmol/L   Potassium 2.9 (L) 3.5 - 5.1 mmol/L   Chloride 105 101 - 111 mmol/L   CO2 19 (L) 22 - 32 mmol/L   Glucose, Bld 147 (H) 65 - 99 mg/dL   BUN 11 6 - 20 mg/dL   Creatinine, Ser 2.26 0.61 - 1.24 mg/dL   Calcium 8.9 8.9 - 23.4 mg/dL   Total Protein 7.7 6.5 - 8.1 g/dL   Albumin 4.0 3.5 - 5.0 g/dL   AST 45 (H) 15 - 41 U/L   ALT <5 (L) 17 - 63 U/L   Alkaline Phosphatase 67 38 - 126 U/L   Total  Bilirubin 0.3 0.3 - 1.2 mg/dL   GFR calc non Af Amer >60 >60 mL/min   GFR calc Af Amer >60 >60 mL/min    Comment: (NOTE) The eGFR has been calculated using the CKD EPI equation. This calculation has not been validated in all clinical situations. eGFR's persistently <60 mL/min signify possible Chronic Kidney Disease.    Anion gap 16 (H) 5 - 15  Ethanol     Status: None   Collection Time: 03/17/15  2:26 PM  Result Value Ref Range   Alcohol, Ethyl (B) <5 <5 mg/dL    Comment:        LOWEST DETECTABLE LIMIT FOR SERUM ALCOHOL IS 5 mg/dL FOR MEDICAL PURPOSES ONLY   Salicylate level     Status: None   Collection Time: 03/17/15  2:26 PM  Result Value Ref Range   Salicylate Lvl <4.0 2.8 - 30.0 mg/dL  Troponin I     Status: None   Collection Time: 03/17/15  2:26 PM  Result Value Ref Range   Troponin I <0.03 <0.031 ng/mL    Comment:        NO INDICATION OF MYOCARDIAL INJURY.   CBC with Differential     Status: Abnormal   Collection Time: 03/17/15  2:26 PM  Result Value Ref Range   WBC 17.1 (H) 4.0 - 10.5 K/uL   RBC 5.33 4.22 - 5.81 MIL/uL   Hemoglobin 13.8 13.0 - 17.0 g/dL   HCT 96.4 57.8 - 77.7 %   MCV 80.9 78.0 - 100.0 fL   MCH 25.9 (L) 26.0 - 34.0 pg   MCHC 32.0 30.0 - 36.0 g/dL   RDW 04.2 (H) 97.6 - 76.7 %   Platelets 419 (H) 150 - 400 K/uL   Neutrophils Relative % 84 %   Neutro Abs 14.4 (H) 1.7 - 7.7 K/uL   Lymphocytes Relative 8 %   Lymphs Abs 1.3 0.7 - 4.0 K/uL   Monocytes Relative 8 %   Monocytes Absolute 1.3 (H) 0.1 - 1.0 K/uL   Eosinophils Relative 0 %   Eosinophils Absolute 0.0 0.0 - 0.7 K/uL   Basophils Relative 0 %   Basophils Absolute 0.0 0.0 - 0.1 K/uL  Magnesium     Status: None   Collection Time: 03/17/15  2:26 PM  Result Value Ref Range   Magnesium 2.4 1.7 - 2.4 mg/dL  Vitamin C01     Status: None   Collection Time: 03/17/15  2:26 PM  Result Value Ref Range   Vitamin B-12 464 180 - 914 pg/mL    Comment: (NOTE) This assay is not validated for testing  neonatal or myeloproliferative syndrome specimens for Vitamin B12 levels. Performed at [facility]   Urinalysis, Routine w reflex microscopic     Status: Abnormal   Collection Time: 03/17/15  4:00 PM  Result Value Ref Range   Color, Urine YELLOW YELLOW   APPearance CLEAR CLEAR   Specific Gravity, Urine 1.020 1.005 - 1.030   pH 5.5 5.0 - 8.0   Glucose, UA NEGATIVE NEGATIVE mg/dL   Hgb urine dipstick TRACE (A) NEGATIVE   Bilirubin Urine NEGATIVE NEGATIVE   Ketones, ur NEGATIVE NEGATIVE mg/dL   Protein, ur NEGATIVE NEGATIVE mg/dL   Nitrite NEGATIVE NEGATIVE   Leukocytes, UA NEGATIVE NEGATIVE  Urine rapid drug screen (hosp performed)     Status: Abnormal   Collection Time: 03/17/15  4:00 PM  Result Value Ref Range   Opiates NONE DETECTED NONE DETECTED   Cocaine NONE DETECTED NONE DETECTED   Benzodiazepines NONE DETECTED NONE DETECTED   Amphetamines NONE DETECTED NONE DETECTED   Tetrahydrocannabinol POSITIVE (A) NONE DETECTED   Barbiturates NONE DETECTED NONE DETECTED    Comment:        DRUG SCREEN FOR MEDICAL PURPOSES ONLY.  IF CONFIRMATION IS NEEDED FOR ANY PURPOSE, NOTIFY LAB WITHIN 5 DAYS.        LOWEST DETECTABLE LIMITS FOR URINE DRUG SCREEN Drug Class       Cutoff (ng/mL) Amphetamine      1000 Barbiturate      200 Benzodiazepine   330 Tricyclics       076 Opiates          300 Cocaine          300 THC              50   Urine microscopic-add on     Status: Abnormal   Collection Time: 03/17/15  4:00 PM  Result Value Ref Range   Squamous Epithelial / LPF 0-5 (A) NONE SEEN   WBC, UA 0-5 0 - 5 WBC/hpf   RBC / HPF 0-5 0 - 5 RBC/hpf   Bacteria, UA FEW (A) NONE SEEN  MRSA PCR Screening     Status: None   Collection Time: 03/17/15  5:45 PM  Result Value Ref Range   MRSA by PCR NEGATIVE NEGATIVE    Comment:        The GeneXpert MRSA Assay (FDA approved for NASAL specimens only), is one component of a comprehensive MRSA colonization surveillance program. It is  not intended to diagnose MRSA infection nor to guide or monitor treatment for MRSA infections.   Culture, blood (Routine X 2) w Reflex to ID Panel     Status: None (Preliminary result)   Collection Time: 03/17/15  7:52 PM  Result Value Ref Range   Specimen Description BLOOD RIGHT HAND    Special Requests BOTTLES DRAWN AEROBIC AND ANAEROBIC 6CC    Culture NO GROWTH < 24 HOURS    Report Status PENDING   TSH     Status: None   Collection Time: 03/17/15  7:52 PM  Result Value Ref Range   TSH 0.528 0.350 - 4.500 uIU/mL  Culture, blood (Routine X 2) w Reflex to ID Panel     Status: None (Preliminary result)   Collection Time: 03/17/15  8:00 PM  Result Value Ref Range   Specimen Description BLOOD RIGHT HAND    Special Requests BOTTLES DRAWN AEROBIC AND ANAEROBIC 6CC    Culture NO GROWTH < 24 HOURS    Report Status PENDING   Basic metabolic panel     Status: Abnormal   Collection Time: 03/18/15  4:47 AM  Result Value Ref Range   Sodium 139 135 - 145 mmol/L   Potassium 3.4 (L)  3.5 - 5.1 mmol/L   Chloride 103 101 - 111 mmol/L   CO2 27 22 - 32 mmol/L   Glucose, Bld 97 65 - 99 mg/dL   BUN 8 6 - 20 mg/dL   Creatinine, Ser 0.85 0.61 - 1.24 mg/dL   Calcium 8.8 (L) 8.9 - 10.3 mg/dL   GFR calc non Af Amer >60 >60 mL/min   GFR calc Af Amer >60 >60 mL/min    Comment: (NOTE) The eGFR has been calculated using the CKD EPI equation. This calculation has not been validated in all clinical situations. eGFR's persistently <60 mL/min signify possible Chronic Kidney Disease.    Anion gap 9 5 - 15  CBC     Status: Abnormal   Collection Time: 03/18/15  4:47 AM  Result Value Ref Range   WBC 17.9 (H) 4.0 - 10.5 K/uL   RBC 5.13 4.22 - 5.81 MIL/uL   Hemoglobin 13.3 13.0 - 17.0 g/dL   HCT 40.7 39.0 - 52.0 %   MCV 79.3 78.0 - 100.0 fL   MCH 25.9 (L) 26.0 - 34.0 pg   MCHC 32.7 30.0 - 36.0 g/dL   RDW 15.9 (H) 11.5 - 15.5 %   Platelets 416 (H) 150 - 400 K/uL    Lipid Panel No results for  input(s): CHOL, TRIG, HDL, CHOLHDL, VLDL, LDLCALC in the last 72 hours.  Studies/Results:   CAROTID DOPP FINE  TTE - Left ventricle: The cavity size was normal. Wall thickness was increased in a pattern of mild LVH. Systolic function was vigorous. The estimated ejection fraction was in the range of 65% to 70%. Wall motion was normal; there were no regional wall motion abnormalities. Left ventricular diastolic function parameters were normal for the patient&'s age. - Mitral valve: There was trivial regurgitation. - Right atrium: The atrium was mildly dilated. Central venous pressure (est): 8 mm Hg. - Atrial septum: No defect or patent foramen ovale was identified. - Tricuspid valve: There was trivial regurgitation. - Pulmonary arteries: PA peak pressure: 34 mm Hg (S). - Pericardium, extracardiac: There was no pericardial effusion.  Impressions:  - Mild LVH with LVEF 65-70% and grossly normal diastolic function. Trivial mitral regurgitation. Trivial tricuspid regurgitation with PASP 34 mmHg. Mildly dilated right atrium. No obvious PFO or ASD.     LABS: VIT B12 464, HIV NR; RPR NR; TSH 0.52; Homocysteine 9.  Medications:  Scheduled Meds: . amLODipine  5 mg Oral Daily  . antiseptic oral rinse  7 mL Mouth Rinse BID  . aspirin  81 mg Oral Daily  . levofloxacin  500 mg Oral q1800  . nicotine  14 mg Transdermal Daily  . phenytoin (DILANTIN) IV  100 mg Intravenous 3 times per day  . potassium chloride  40 mEq Oral Daily  . thiamine  100 mg Oral Daily   Continuous Infusions:  PRN Meds:.acetaminophen **OR** acetaminophen, LORazepam, ondansetron **OR** ondansetron (ZOFRAN) IV, polyethylene glycol     LOS: 1 day   Tashika Goodin A. Reginald [name], M.D.  Diplomate, Tax adviser of Psychiatry and Neurology ( Neurology).

## 2015-03-18 NOTE — Progress Notes (Signed)
Reginald [name] WUJ:[id number] DOB: [birth year] DOA: 03/17/2015 PCP: Sissy Hoff, MD  Brief narrative: 55 y/o ?  Sparse prior medical history inclusive of HTN, seizure disorder  known heavy ethanol use   noncompliant on seizure medications for some time.  seizures started November 2016 Neurology consulted on admission regarding seizures and possible lesions in the brain on MRI.  Past medical history-As per Problem list Chart reviewed as below-  reviewed  Consultants:  Neurology  Procedures:  MRI  Antibiotics:   NOne   Subjective   Fair  No further Sz Doesn't remember what was happening aroudn the time  And cannot remember episodes of Sz No cp no n/v/blurred no double vision No weakness   Objective    Interim History:   Telemetry: sinus   Objective: Filed Vitals:   03/18/15 0200 03/18/15 0300 03/18/15 0400 03/18/15 0500  BP: 115/90 110/86 116/85   Pulse: 97 81 78 78  Temp:   98 F (36.7 C)   TempSrc:   Oral   Resp: Height:      Weight:    105.4 kg (232 lb 5.8 oz)  SpO2: 95% 94% 96% 94%    Intake/Output Summary (Last 24 hours) at 03/18/15 0736 Last data filed at 03/18/15 0500  Gross per 24 hour  Intake 1544.17 ml  Output    600 ml  Net 944.17 ml    Exam:  General: EOMI ncat Cardiovascular: s1 s 2no m/r/g Respiratory: clear no added sound Abdomen:  Soft nt nd no rebound Skin no le edema Neuro nad  Data Reviewed: Basic Metabolic Panel:  Recent Labs Lab 03/17/15 1426 03/18/15 0447  NA 140 139  K 2.9* 3.4*  CL 105 103  CO2 19* 27  GLUCOSE 147* 97  BUN 11 8  CREATININE 1.18 0.85  CALCIUM 8.9 8.8*  MG 2.4  --    Liver Function Tests:  Recent Labs Lab 03/17/15 1426  AST 45*  ALT <5*  ALKPHOS 67  BILITOT 0.3  PROT 7.7  ALBUMIN 4.0   No results for input(s): LIPASE, AMYLASE in the last 168 hours. No results for input(s): AMMONIA in the last 168 hours. CBC:  Recent Labs Lab 03/17/15 1426 03/18/15 0447    WBC 17.1* 17.9*  NEUTROABS 14.4*  --   HGB 13.8 13.3  HCT 43.1 40.7  MCV 80.9 79.3  PLT 419* 416*   Cardiac Enzymes:  Recent Labs Lab 03/17/15 1426  TROPONINI <0.03   BNP: Invalid input(s): POCBNP CBG: No results for input(s): GLUCAP in the last 168 hours.  Recent Results (from the past 240 hour(s))  MRSA PCR Screening     Status: None   Collection Time: 03/17/15  5:45 PM  Result Value Ref Range Status   MRSA by PCR NEGATIVE NEGATIVE Final    Comment:        The GeneXpert MRSA Assay (FDA approved for NASAL specimens only), is one component of a comprehensive MRSA colonization surveillance program. It is not intended to diagnose MRSA infection nor to guide or monitor treatment for MRSA infections.   Culture, blood (Routine X 2) w Reflex to ID Panel     Status: None (Preliminary result)   Collection Time: 03/17/15  7:52 PM  Result Value Ref Range Status   Specimen Description BLOOD RIGHT HAND  Final   Special Requests BOTTLES DRAWN AEROBIC AND ANAEROBIC [facility]  Final   Culture PENDING  Incomplete   Report Status PENDING  Incomplete  Culture, blood (Routine X 2) w Reflex to ID Panel     Status: None (Preliminary result)   Collection Time: 03/17/15  8:00 PM  Result Value Ref Range Status   Specimen Description BLOOD RIGHT HAND  Final   Special Requests BOTTLES DRAWN AEROBIC AND ANAEROBIC 6CC  Final   Culture PENDING  Incomplete   Report Status PENDING  Incomplete     Studies:              All Imaging reviewed and is as per above notation   Scheduled Meds: . sodium chloride   Intravenous STAT  . antiseptic oral rinse  7 mL Mouth Rinse BID  . aspirin  81 mg Oral Daily  . azithromycin  250 mg Intravenous Q24H  . cefTRIAXone (ROCEPHIN)  IV  1 g Intravenous Q24H  . phenytoin (DILANTIN) IV  100 mg Intravenous 3 times per day  . thiamine  100 mg Oral Daily   Continuous Infusions: . 0.9 % NaCl with KCl 40 mEq / L 125 mL/hr (03/18/15 0432)      Assessment/Plan:  1. 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  Convert Dilantin to 100 tid PO.  Await ECHO ordered by Neurology.  In addition follow vitamin B12, homocysteine, thyroid function tests and HIV, Carotid duplex Doppler 2. 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 Hypokalemia-k 2.9 on admit.  replacing with IV-transition to PO KDUr 40 meq daily.  Rpt am labs 4. HTn- controlled at present.  On no meds.  Initiate low dose Amlodipine 5 mg this admit 5. ? ETOHism-tells me drinks 1/5th liquor a month.  Drinks "bourbon in coffee"  Still working.  Will elicit more h/o from family when they are available. 6. Smoker-Counselled to quit.  Patch offered. 7. ? Aspiration-no current fever-Narrow Abx IV Azithro/Ceftriaxone rapidly to levaquin then off.  Rpt CXR in am to determine if a real infiltrate.  Desat screen and take off oxygen.  Monitor sats when ambulatory  Keep SDU for now-might transfer later today Ambulate around unit Monitor overnight and when work-up complete possibly can d/c?    Pleas Koch, MD  [facility] Pager [phone] 03/18/2015, 7:36 AM    LOS: 1 day

## 2015-03-19 LAB — BASIC METABOLIC PANEL
ANION GAP: 8 (ref 5–15)
BUN: 10 mg/dL (ref 6–20)
CHLORIDE: 104 mmol/L (ref 101–111)
CO2: 26 mmol/L (ref 22–32)
CREATININE: 0.82 mg/dL (ref 0.61–1.24)
Calcium: 8.9 mg/dL (ref 8.9–10.3)
GFR calc non Af Amer: >60 mL/min (ref >60)
Glucose, Bld: 96 mg/dL (ref 65–99)
POTASSIUM: 4 mmol/L (ref 3.5–5.1)
SODIUM: 138 mmol/L (ref 135–145)

## 2015-03-19 LAB — CBC
HCT: 42.4 % (ref 39.0–52.0)
HEMOGLOBIN: 13.6 g/dL (ref 13.0–17.0)
MCH: 25.7 pg — AB (ref 26.0–34.0)
MCHC: 32.1 g/dL (ref 30.0–36.0)
MCV: 80.2 fL (ref 78.0–100.0)
PLATELETS: 412 10*3/uL — AB (ref 150–400)
RBC: 5.29 MIL/uL (ref 4.22–5.81)
RDW: 16.2 % — AB (ref 11.5–15.5)
WBC: 13.4 10*3/uL — AB (ref 4.0–10.5)

## 2015-03-19 LAB — HOMOCYSTEINE: Homocysteine: 9 umol/L (ref 0.0–15.0)

## 2015-03-19 LAB — HIV ANTIBODY (ROUTINE TESTING W REFLEX): HIV Screen 4th Generation wRfx: NONREACTIVE

## 2015-03-19 LAB — RPR: RPR Ser Ql: NONREACTIVE

## 2015-03-19 NOTE — Procedures (Signed)
  [facility] Charmel Pronovost A. Gerilyn Pilgrim, MD     [email or web address]           HISTORY: The patient presents with altered mental status and seizures.  He is 55 years old.  MEDICATIONS: Scheduled Meds: . amLODipine  5 mg Oral Daily  . antiseptic oral rinse  7 mL Mouth Rinse BID  . aspirin  81 mg Oral Daily  . levofloxacin  500 mg Oral q1800  . nicotine  14 mg Transdermal Daily  . phenytoin (DILANTIN) IV  100 mg Intravenous 3 times per day  . potassium chloride  40 mEq Oral Daily  . thiamine  100 mg Oral Daily   Continuous Infusions:  PRN Meds:.acetaminophen **OR** acetaminophen, LORazepam, ondansetron **OR** ondansetron (ZOFRAN) IV, polyethylene glycol  Prior to Admission medications   Medication Sig Start Date End Date Taking? Authorizing Provider  Amlodipine-Valsartan-HCTZ 10-160-25 MG TABS Take 1 tablet by mouth every morning. 03/04/15  Yes Historical Provider, MD  Omega-3 Fatty Acids (FISH OIL) 1000 MG CAPS Take by mouth.   Yes Historical Provider, MD      ANALYSIS: A 16 channel recording using standard 10 20 measurements is conducted for 22 minutes. There is a well-formed posterior dominant rhythm of 12 Hz which attenuates with opening. There is beta activity observed in the frontal areas. Awake and sleep activities are observed. There is significant spindling observed throughout the recording. Photic simulation is carried out without abnormal changes in the background activity. There is no focal or lateral slowing. There is no epileptiform from activity observed.   IMPRESSION: This recording of the awake and sleep states is essentially unremarkable. There is no epileptiform activity is observed.      Torben Soloway A. Gerilyn Pilgrim, M.D.  Diplomate, Biomedical engineer of Psychiatry and Neurology ( Neurology).

## 2015-03-19 NOTE — Progress Notes (Signed)
Reginald [name] ZOX:[id number] DOB: [birth year] DOA: 03/17/2015 PCP: Sissy Hoff, MD  Brief narrative: 55 y/o ?  Sparse prior medical history inclusive of HTN, seizure disorder  known heavy ethanol use   noncompliant on seizure medications for some time.  seizures started November 2016 Neurology consulted on admission regarding seizures and possible lesions in the brain on MRI.  Past medical history-As per Problem list Chart reviewed as below-  reviewed  Consultants:  Neurology  Procedures:  MRI  Antibiotics:   NOne   Subjective   Fair  No further Sz No other issue No n/v/cp No sob No unilateral weakness   Objective    Interim History:   Telemetry: sinus   Objective: Filed Vitals:   03/19/15 0500 03/19/15 0600 03/19/15 0824 03/19/15 1234  BP: 113/71 117/90    Pulse: 77 74    Temp:   97.8 F (36.6 C) 97.1 F (36.2 C)  TempSrc:   Oral Oral  Resp: 22 19    Height:      Weight: 105.7 kg (233 lb 0.4 oz)     SpO2: 86% 94%      Intake/Output Summary (Last 24 hours) at 03/19/15 1523 Last data filed at 03/19/15 0600  Gross per 24 hour  Intake    960 ml  Output   1500 ml  Net   -540 ml    Exam:  General: EOMI ncat Cardiovascular: s1 s 2no m/r/g Respiratory: clear no added sound Abdomen:  Soft nt nd no rebound Skin: no le edema Neuro: nad.  NO weakness unilaterally.  No deficits to strenght  Data Reviewed: Basic Metabolic Panel:  Recent Labs Lab 03/17/15 1426 03/18/15 0447 03/19/15 0514  NA 140 139 138  K 2.9* 3.4* 4.0  CL 105 103 104  CO2 19* 27 26  GLUCOSE 147* 97 96  BUN CREATININE 1.18 0.85 0.82  CALCIUM 8.9 8.8* 8.9  MG 2.4  --   --    Liver Function Tests:  Recent Labs Lab 03/17/15 1426  AST 45*  ALT <5*  ALKPHOS 67  BILITOT 0.3  PROT 7.7  ALBUMIN 4.0   No results for input(s): LIPASE, AMYLASE in the last 168 hours. No results for input(s): AMMONIA in the last 168 hours. CBC:  Recent Labs Lab  03/17/15 1426 03/18/15 0447 03/19/15 0514  WBC 17.1* 17.9* 13.4*  NEUTROABS 14.4*  --   --   HGB 13.8 13.3 13.6  HCT 43.1 40.7 42.4  MCV 80.9 79.3 80.2  PLT 419* 416* 412*   Cardiac Enzymes:  Recent Labs Lab 03/17/15 1426  TROPONINI <0.03   BNP: Invalid input(s): POCBNP CBG: No results for input(s): GLUCAP in the last 168 hours.  Recent Results (from the past 240 hour(s))  MRSA PCR Screening     Status: None   Collection Time: 03/17/15  5:45 PM  Result Value Ref Range Status   MRSA by PCR NEGATIVE NEGATIVE Final    Comment:        The GeneXpert MRSA Assay (FDA approved for NASAL specimens only), is one component of a comprehensive MRSA colonization surveillance program. It is not intended to diagnose MRSA infection nor to guide or monitor treatment for MRSA infections.   Culture, blood (Routine X 2) w Reflex to ID Panel     Status: None (Preliminary result)   Collection Time: 03/17/15  7:52 PM  Result Value Ref Range Status   Specimen Description BLOOD RIGHT HAND  Final  Special Requests BOTTLES DRAWN AEROBIC AND ANAEROBIC 6CC  Final   Culture NO GROWTH 2 DAYS  Final   Report Status PENDING  Incomplete  Culture, blood (Routine X 2) w Reflex to ID Panel     Status: None (Preliminary result)   Collection Time: 03/17/15  8:00 PM  Result Value Ref Range Status   Specimen Description BLOOD RIGHT HAND  Final   Special Requests BOTTLES DRAWN AEROBIC AND ANAEROBIC 6CC  Final   Culture NO GROWTH 2 DAYS  Final   Report Status PENDING  Incomplete     Studies:              All Imaging reviewed and is as per above notation   Scheduled Meds: . amLODipine  5 mg Oral Daily  . antiseptic oral rinse  7 mL Mouth Rinse BID  . aspirin  81 mg Oral Daily  . levofloxacin  500 mg Oral q1800  . nicotine  14 mg Transdermal Daily  . phenytoin (DILANTIN) IV  100 mg Intravenous 3 times per day  . potassium chloride  40 mEq Oral Daily  . thiamine  100 mg Oral Daily   Continuous  Infusions:     Assessment/Plan:  1. 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  Convert Dilantin to 100 tid PO.   ECHO LV EF 65-70% ordered by Neurology.  In addition - follow vitamin B12 normal, homocysteine pending, TFT normal and HIV neg, RPR neg, Carotid duplex Doppler less than 50%.  Will keep on AED.  EEG also appears wnl.  If no furthe rissues overnight, would d/c home on AED with OP neuro follow-up.  Tesll me was smoking marijuana from "another source"-wife reasearched on internet and started him on this. Given his recent non-sustained Sz months ago  Note that synthetic MJ can casue seizures.  Coubselled re: need close follow-up with OP Neurology 2. 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 Hypokalemia-k 2.9 on admit.  replacing with IV-transition to PO KDUr 40 meq daily.  Rpt am labs show resolution 3/2 4. HTn- controlled at present.  On no meds.  Initiate low dose Amlodipine 5 mg this admit 5. ? ETOHism-tells me drinks 1/5th liquor a month.  Drinks "bourbon in coffee"  Still working. Not heavy drinker-unclear if this is the cause 6. Smoker-Counselled to quit.  Patch offered. 7. ? Aspiration-no current fever-Narrow Abx IV Azithro/Ceftriaxone rapidly to levaquin then off.  Rpt CXR 3/1 no PNA.  Desat screen and take off oxygen.  Monitor sats when ambulatory   likely d/c am   Pleas Koch, MD  [facility] Pager [phone] 03/19/2015, 3:23 PM    LOS: 2 days

## 2015-03-20 MED ORDER — PHENYTOIN SODIUM EXTENDED 100 MG PO CAPS: 100.0000 mg | ORAL_CAPSULE | Freq: Three times a day (TID) | ORAL | Status: DC

## 2015-03-20 MED ORDER — PHENYTOIN SODIUM 50 MG/ML IJ SOLN: 100.0000 mg | Freq: Three times a day (TID) | INTRAMUSCULAR | Status: DC

## 2015-03-20 MED ORDER — NICOTINE 14 MG/24HR TD PT24: 14.0000 mg | MEDICATED_PATCH | Freq: Every day | TRANSDERMAL | Status: DC

## 2015-03-20 MED ORDER — ASPIRIN 81 MG PO CHEW: 81.0000 mg | CHEWABLE_TABLET | Freq: Every day | ORAL | Status: AC

## 2015-03-20 NOTE — Discharge Summary (Signed)
Physician Discharge Summary  Reginald [name] ZOX:[id number] DOB: 13-Mar-1960 DOA: 03/17/2015  PCP: Sissy Hoff, MD  Admit date: 03/17/2015 Discharge date: 03/20/2015  Time spent: 30 minutes  Recommendations for Outpatient Follow-up:  1. Patient should continue phenytoin 100 3 times a day as well as aspirin 81 mg until followed by neurologist 2. No driving until seen by neurologist and at least 6 months of past with patient being seizure-free 3. Patient urged to quit smoking marijuana/tobacco  Discharge Diagnoses:  Principal Problem:   Convulsions/seizures (HCC) Active Problems:   Aspiration pneumonia (HCC)   Lesion of right frontal lobe of brain -by CT/ MR   History of hypertension   Tonic clonic seizures (HCC)   Hypokalemia   Discharge Condition: Fair  Diet recommendation: Heart healthy low-salt  Filed Weights   03/18/15 0500 03/19/15 0500 03/20/15 0500  Weight: 105.4 kg (232 lb 5.8 oz) 105.7 kg (233 lb 0.4 oz) 105 kg (231 lb 7.7 oz)    History of present illness:  55 y/o ? Sparse prior medical history inclusive of HTN, seizure disorder known heavy ethanol use  noncompliant on seizure medications for some time. seizures started November 2016 Neurology consulted on admission regarding seizures and possible lesions in the brain on MRI.  Hospital Course:      Assessment/Plan:  1. 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 Convert Dilantin to 100 tid PO. ECHO LV EF 65-70% ordered by Neurology. In addition - follow vitamin B12 normal, homocysteine pending, TFT normal and HIV neg, RPR neg, Carotid duplex Doppler less than 50%. Will keep on AED. EEG also appears wnl.Hal Neer me was smoking marijuana from "another source"-wife reasearched on internet and started him on this. Given his recent non-sustained Sz months ago Note that synthetic MJ can casue seizures. Couselled re: need close follow-up with OP Neurology, no  driving or operating heavy machinery and patient understands 2. 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  Needs aspirin 81 mg daily on discharge 3. Hypokalemia-k 2.9 on admit. replacing with IV-transition to PO KDUr 40 meq daily. Rpt am labs show resolution 3/2 4. HTn- controlled at present. On no meds. Initiate low dose Amlodipine 5 mg this admit 5. ? ETOHism-tells me drinks 1/5th liquor a month. Drinks "bourbon in coffee" Still working. Not heavy drinker-unclear if this is the cause 6. Smoker-Counselled to quit. Patch offered. 7. ? Aspiration-no current fever-Narrow Abx IV Azithro/Ceftriaxone rapidly to levaquin then off. Rpt CXR 3/1 no PNA. Desat screen and take off oxygen. Monitor sats when ambulatory  Procedures: EEG, MRI, Doppler,   Consultations:  Neurology  Discharge Exam: Filed Vitals:   03/20/15 0300 03/20/15 0400  BP: 132/101 151/101  Pulse: 72 73  Temp:  99.1 F (37.3 C)  Resp: 15 21    General: Alert pleasant oriented no apparent distress Cardiovascular: S1-S2 no murmur rub or gallop Respiratory: Clinically clear  Discharge Instructions   Discharge Instructions    Diet - low sodium heart healthy    Complete by:  As directed      Discharge instructions    Complete by:  As directed   Seizures mean no further driving for 6 mo No anti epileptic needed per Neurology Follow up with Neurology in 6-8     Increase activity slowly    Complete by:  As directed           Current Discharge Medication List    START taking these medications   Details  aspirin 81 MG chewable  tablet Chew 1 tablet (81 mg total) by mouth daily. Qty: 30 tablet, Refills: 0    nicotine (NICODERM CQ - DOSED IN MG/24 HOURS) 14 mg/24hr patch Place 1 patch (14 mg total) onto the skin daily. Qty: 28 patch, Refills: 0    phenytoin (DILANTIN) 100 MG ER capsule Take 1 capsule (100 mg total) by mouth 3 (three) times daily. Qty: 90 capsule, Refills: 0      CONTINUE  these medications which have NOT CHANGED   Details  Amlodipine-Valsartan-HCTZ 10-160-25 MG TABS Take 1 tablet by mouth every morning. Refills: 0    Omega-3 Fatty Acids (FISH OIL) 1000 MG CAPS Take by mouth.        Follow-up Information    Follow up with Sissy Hoff, MD In 1 week.   Specialty:  Family Medicine   Contact information:   [phone] [address] [place] [address] [phone]       Follow up with [facility], KOFI, MD In 2 weeks.   Specialty:  Neurology   Contact information:   [address] [place] [address] [phone]        The results of significant diagnostics from this hospitalization (including imaging, microbiology, ancillary and laboratory) are listed below for reference.    Significant Diagnostic Studies: Dg Chest 2 View  03/18/2015  CLINICAL DATA:  Hypertension.  Dizziness.  Pneumonia. EXAM: CHEST  2 VIEW COMPARISON:  03/17/2015 FINDINGS: Moderate thoracic spondylosis. Midline trachea. Mild cardiomegaly. Mediastinal contours otherwise within normal limits. No pleural effusion or pneumothorax. Markedly improved right base aeration. Mild lingular and left lower lobe opacity remains, favoring subsegmental atelectasis. IMPRESSION: No acute cardiopulmonary disease. Resolution of right base airspace disease with probable left lower lobe and lingular atelectasis remaining. Electronically Signed   By: Jeronimo Greaves M.D.   On: 03/18/2015 14:42   Ct Head Wo Contrast  03/17/2015  CLINICAL DATA:  Witnessed seizure earlier today. Several recent seizures by report EXAM: CT HEAD WITHOUT CONTRAST TECHNIQUE: Contiguous axial images were obtained from the base of the skull through the vertex without intravenous contrast. COMPARISON:  None. FINDINGS: The ventricles are normal in size and configuration. There is no intracranial hemorrhage, extra-axial fluid collection, or midline shift. There is a focal area of decreased attenuation in the mid right  cerebellum slightly inferior and posterior to the right dentate nucleus, consistent with a prior small infarct. There is small vessel disease in the anterior right centrum semiovale. There is a focal area of decreased attenuation at the gray -white junction of the mid right frontal lobe, best seen on axial slice 19 series 2, consistent with either a recent infarct or possibly a focal area of edema from a small mass lesion. No other findings suggesting potential mass are appreciable. Elsewhere gray-white compartments appear normal. The bony calvarium appears intact. The mastoid air cells are clear. Visualized orbital regions appear symmetric bilaterally. There is mild mucosal thickening in the right maxillary antrum. There is mucosal thickening in several ethmoid air cells bilaterally. IMPRESSION: Focal area of decreased attenuation at the gray - white junction of the mid right frontal lobe measuring 1.4 x 1.1 cm, best seen on axial slice 19 series 2. Question small recent infarct or possibly edema from a small mass, particularly given the history. This finding may warrant pre and post-contrast MR to further assess. Prior infarct in the posterior mid right cerebellum. Focal small vessel disease in the anterior right centrum semiovale. Mild paranasal sinus disease. Electronically Signed  By: Bretta Bang III M.D.   On: 03/17/2015 15:12   Mr Brain Wo Contrast (neuro Protocol)  03/17/2015  CLINICAL DATA:  Multiple seizures.  Abnormal CT scan. EXAM: MRI HEAD WITHOUT CONTRAST TECHNIQUE: Multiplanar, multiecho pulse sequences of the brain and surrounding structures were obtained without intravenous contrast. COMPARISON:  None. FINDINGS: Asymmetric right-sided periventricular white matter changes present. There 2 focal subcortical white matter lesions anteriorly on the right as well. No significant left-sided white matter disease is present. A focal T2 hyperintensity is also present within the right cerebellum. The  diffusion-weighted images demonstrate no evidence for acute infarction. No acute hemorrhage or mass lesion is present. The internal auditory canals are within normal limits bilaterally. Flow is present in the major intracranial arteries. The globes and orbits are intact. Mild mucosal thickening is present in the ethmoid air cells and maxillary sinuses bilaterally. The skullbase is within normal limits. Midline sagittal images are unremarkable. IMPRESSION: 1. Asymmetric right-sided T2 hyperintensities in the anterior right frontal lobe and right cerebellum. These most likely related to remote ischemia. Asymmetric demyelinating disease is considered less likely. Given the patient's recent seizures, MRI with contrast would be useful in the evaluation of possible underlying neoplasm. 2. No acute intracranial abnormality. 3. Mild sinus disease. Electronically Signed   By: Marin Roberts M.D.   On: 03/17/2015 16:40   US Carotid Bilateral  03/18/2015  CLINICAL DATA:  Stroke. EXAM: BILATERAL CAROTID DUPLEX ULTRASOUND TECHNIQUE: Wallace Cullens scale imaging, color Doppler and duplex ultrasound were performed of bilateral carotid and vertebral arteries in the neck. COMPARISON:  None. FINDINGS: Criteria: Quantification of carotid stenosis is based on velocity parameters that correlate the residual internal carotid diameter with NASCET-based stenosis levels, using the diameter of the distal internal carotid lumen as the denominator for stenosis measurement. The following velocity measurements were obtained: RIGHT ICA:  126/17 cm/sec CCA:  131/25 cm/sec SYSTOLIC ICA/CCA RATIO:  1.0 DIASTOLIC ICA/CCA RATIO:  0 point set ECA:  117 cm/sec LEFT ICA:  110/22 cm/sec CCA:  138/25 cm/sec SYSTOLIC ICA/CCA RATIO:  0.8 DIASTOLIC ICA/CCA RATIO:  0.9 ECA:  97 cm/sec RIGHT CAROTID ARTERY: Mild right carotid diffuse atherosclerotic vascular disease. No flow limiting stenosis. RIGHT VERTEBRAL ARTERY:  Patent with antegrade flow. LEFT CAROTID  ARTERY: Mild left carotid diffuse atherosclerotic vascular disease. No flow limiting stenosis. LEFT VERTEBRAL ARTERY:  Patent antegrade flow. IMPRESSION: 1. 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 No flow limiting stenosis. Degree of stenosis less than 50%. 2.  Vertebral arteries are patent antegrade flow . Electronically Signed   By: Maisie Fus  Register   On: 03/18/2015 12:07   Dg Chest Port 1 View  03/17/2015  CLINICAL DATA:  Multiple seizures today EXAM: PORTABLE CHEST 1 VIEW COMPARISON:  11/30/2005 FINDINGS: Borderline cardiomegaly. Central mild vascular congestion without convincing pulmonary edema. Hazy right middle lobe and right base medially atelectasis or infiltrate. Mild degenerative changes thoracic spine IMPRESSION: Borderline cardiomegaly. Hazy right middle lobe and right base medially atelectasis or early infiltrate. Central mild vascular congestion without convincing pulmonary edema. Electronically Signed   By: Natasha Mead M.D.   On: 03/17/2015 14:42    Microbiology: Recent Results (from the past 240 hour(s))  MRSA PCR Screening     Status: None   Collection Time: 03/17/15  5:45 PM  Result Value Ref Range Status   MRSA by PCR NEGATIVE NEGATIVE Final    Comment:        The GeneXpert MRSA Assay (FDA approved for NASAL specimens only), is  one component of a comprehensive MRSA colonization surveillance program. It is not intended to diagnose MRSA infection nor to guide or monitor treatment for MRSA infections.   Culture, blood (Routine X 2) w Reflex to ID Panel     Status: None (Preliminary result)   Collection Time: 03/17/15  7:52 PM  Result Value Ref Range Status   Specimen Description BLOOD RIGHT HAND  Final   Special Requests BOTTLES DRAWN AEROBIC AND ANAEROBIC 6CC  Final   Culture NO GROWTH 3 DAYS  Final   Report Status PENDING  Incomplete  Culture, blood (Routine X 2) w Reflex to ID Panel     Status: None (Preliminary result)   Collection Time:  03/17/15  8:00 PM  Result Value Ref Range Status   Specimen Description BLOOD RIGHT HAND  Final   Special Requests BOTTLES DRAWN AEROBIC AND ANAEROBIC 6CC  Final   Culture NO GROWTH 3 DAYS  Final   Report Status PENDING  Incomplete     Labs: Basic Metabolic Panel:  Recent Labs Lab 03/17/15 1426 03/18/15 0447 03/19/15 0514  NA 140 139 138  K 2.9* 3.4* 4.0  CL 105 103 104  CO2 19* 27 26  GLUCOSE 147* 97 96  BUN 11 8 10   CREATININE 1.18 0.85 0.82  CALCIUM 8.9 8.8* 8.9  MG 2.4  --   --    Liver Function Tests:  Recent Labs Lab 03/17/15 1426  AST 45*  ALT <5*  ALKPHOS 67  BILITOT 0.3  PROT 7.7  ALBUMIN 4.0   No results for input(s): LIPASE, AMYLASE in the last 168 hours. No results for input(s): AMMONIA in the last 168 hours. CBC:  Recent Labs Lab 03/17/15 1426 03/18/15 0447 03/19/15 0514  WBC 17.1* 17.9* 13.4*  NEUTROABS 14.4*  --   --   HGB 13.8 13.3 13.6  HCT 43.1 40.7 42.4  MCV 80.9 79.3 80.2  PLT 419* 416* 412*   Cardiac Enzymes:  Recent Labs Lab 03/17/15 1426  TROPONINI <0.03   BNP: BNP (last 3 results) No results for input(s): BNP in the last 8760 hours.  ProBNP (last 3 results) No results for input(s): PROBNP in the last 8760 hours.  CBG: No results for input(s): GLUCAP in the last 168 hours.     SignedRhetta Mura:  Tekeyah Santiago, JAI-GURMUKH MD   [facility] 03/20/2015, 8:19 AM

## 2015-03-20 NOTE — Progress Notes (Signed)
Discharge instructions given, verbalized understanding, out in stable condition via w/c with staff. 

## 2015-03-20 NOTE — Care Management Note (Signed)
Case Management Note  Patient Details  Name: Evelena PeatDaryll Ulrey MRN: [id number] Date of Birth: [birth year]    Expected Discharge Date:                  Expected Discharge Plan:  Home/Self Care  In-House Referral:  NA  Discharge planning Services  CM Consult  Post Acute Care Choice:  NA Choice offered to:  NA  DME Arranged:    DME Agency:     HH Arranged:    HH Agency:     Status of Service:  Completed, signed off  Medicare Important Message Given:    Date Medicare IM Given:    Medicare IM give by:    Date Additional Medicare IM Given:    Additional Medicare Important Message give by:     If discussed at Long Length of Stay Meetings, dates discussed:    Additional Comments: Pt discharged home today. Does not require home O2. No CM needs.  Malcolm Metrohildress, Keniesha Adderly Demske, RN 03/20/2015, 11:20 AM

## 2015-03-20 NOTE — Progress Notes (Signed)
NO SEIZURE ACTIVITY SINCE ADMISSION TO ICU

## 2015-03-23 LAB — CULTURE, BLOOD (ROUTINE X 2)
CULTURE: NO GROWTH
CULTURE: NO GROWTH

## 2015-05-22 ENCOUNTER — Observation Stay (HOSPITAL_COMMUNITY)
Admission: EM | Admit: 2015-05-22 | Discharge: 2015-05-24 | Disposition: A | Discharge status: Home / Self Care | Payer: BLUE CROSS/BLUE SHIELD | Attending: Internal Medicine | Admitting: Internal Medicine

## 2015-05-22 ENCOUNTER — Encounter (HOSPITAL_COMMUNITY): Payer: Self-pay | Admitting: Emergency Medicine

## 2015-05-22 DIAGNOSIS — Z8249 Family history of ischemic heart disease and other diseases of the circulatory system: Secondary | ICD-10-CM | POA: Diagnosis not present

## 2015-05-22 DIAGNOSIS — Z803 Family history of malignant neoplasm of breast: Secondary | ICD-10-CM | POA: Insufficient documentation

## 2015-05-22 DIAGNOSIS — I10 Essential (primary) hypertension: Secondary | ICD-10-CM | POA: Insufficient documentation

## 2015-05-22 DIAGNOSIS — G40409 Other generalized epilepsy and epileptic syndromes, not intractable, without status epilepticus: Secondary | ICD-10-CM | POA: Diagnosis not present

## 2015-05-22 DIAGNOSIS — F17200 Nicotine dependence, unspecified, uncomplicated: Secondary | ICD-10-CM | POA: Diagnosis not present

## 2015-05-22 DIAGNOSIS — R569 Unspecified convulsions: Secondary | ICD-10-CM | POA: Diagnosis present

## 2015-05-22 DIAGNOSIS — F1010 Alcohol abuse, uncomplicated: Secondary | ICD-10-CM | POA: Diagnosis not present

## 2015-05-22 DIAGNOSIS — Z79899 Other long term (current) drug therapy: Secondary | ICD-10-CM | POA: Insufficient documentation

## 2015-05-22 DIAGNOSIS — Z7982 Long term (current) use of aspirin: Secondary | ICD-10-CM | POA: Diagnosis not present

## 2015-05-22 DIAGNOSIS — Z23 Encounter for immunization: Secondary | ICD-10-CM | POA: Diagnosis not present

## 2015-05-22 DIAGNOSIS — Z880 Allergy status to penicillin: Secondary | ICD-10-CM | POA: Insufficient documentation

## 2015-05-22 DIAGNOSIS — Z8679 Personal history of other diseases of the circulatory system: Secondary | ICD-10-CM

## 2015-05-22 LAB — CBC WITH DIFFERENTIAL/PLATELET
Basophils Absolute: 0.1 10*3/uL (ref 0.0–0.1)
Basophils Relative: 0 %
Eosinophils Absolute: 0.4 10*3/uL (ref 0.0–0.7)
Eosinophils Relative: 4 %
HCT: 41 % (ref 39.0–52.0)
Hemoglobin: 13.2 g/dL (ref 13.0–17.0)
Lymphocytes Relative: 21 %
Lymphs Abs: 2.5 10*3/uL (ref 0.7–4.0)
MCH: 25.8 pg — ABNORMAL LOW (ref 26.0–34.0)
MCHC: 32.2 g/dL (ref 30.0–36.0)
MCV: 80.1 fL (ref 78.0–100.0)
Monocytes Absolute: 1.5 10*3/uL — ABNORMAL HIGH (ref 0.1–1.0)
Monocytes Relative: 12 %
Neutro Abs: 7.7 10*3/uL (ref 1.7–7.7)
Neutrophils Relative %: 63 %
Platelets: 323 10*3/uL (ref 150–400)
RBC: 5.12 MIL/uL (ref 4.22–5.81)
RDW: 16.7 % — ABNORMAL HIGH (ref 11.5–15.5)
WBC: 12.1 10*3/uL — ABNORMAL HIGH (ref 4.0–10.5)

## 2015-05-22 LAB — BASIC METABOLIC PANEL WITH GFR
Anion gap: 8 (ref 5–15)
BUN: 12 mg/dL (ref 6–20)
CO2: 26 mmol/L (ref 22–32)
Calcium: 8.8 mg/dL — ABNORMAL LOW (ref 8.9–10.3)
Chloride: 102 mmol/L (ref 101–111)
Creatinine, Ser: 1.05 mg/dL (ref 0.61–1.24)
GFR calc Af Amer: >60 mL/min
GFR calc non Af Amer: >60 mL/min
Glucose, Bld: 96 mg/dL (ref 65–99)
Potassium: 3.6 mmol/L (ref 3.5–5.1)
Sodium: 136 mmol/L (ref 135–145)

## 2015-05-22 LAB — ETHANOL

## 2015-05-22 LAB — CBG MONITORING, ED: GLUCOSE-CAPILLARY: 112 mg/dL — AB (ref 65–99)

## 2015-05-22 LAB — PHENYTOIN LEVEL, TOTAL: Phenytoin Lvl: <2.5 ug/mL — ABNORMAL LOW (ref 10.0–20.0)

## 2015-05-22 MED ORDER — PHENYTOIN SODIUM 50 MG/ML IJ SOLN
INTRAMUSCULAR | Status: AC
  Filled 2015-05-22: qty 20

## 2015-05-22 MED ORDER — SODIUM CHLORIDE 0.9 % IV SOLN
1000.0000 mg | Freq: Once | INTRAVENOUS | Status: AC
  Administered 2015-05-22: 1000 mg via INTRAVENOUS
  Filled 2015-05-22: qty 20

## 2015-05-22 MED ORDER — LORAZEPAM 2 MG/ML IJ SOLN
1.0000 mg | Freq: Once | INTRAMUSCULAR | Status: AC
  Administered 2015-05-22: 1 mg via INTRAVENOUS

## 2015-05-22 MED ORDER — LORAZEPAM 2 MG/ML IJ SOLN
INTRAMUSCULAR | Status: AC
  Administered 2015-05-22: 1 mg via INTRAVENOUS
  Filled 2015-05-22: qty 1

## 2015-05-22 NOTE — ED Notes (Signed)
Pt's wife called out stating pt was beginning to have pre-seizure activity. MD Ray called, orders given.

## 2015-05-22 NOTE — ED Notes (Signed)
Per EMS pt's wife stated he became confused and started to have seizure activity for approx 1 min. Pt does not remember what happened prior to or after the event. Pt was oriented to person upon EMS arrival, pt is now oriented X4. Hx of seizures started in November of last year and pt reports he is on Dilantin.

## 2015-05-22 NOTE — ED Notes (Signed)
Pt's wife called out stating pt was beginning to have "pre-seizure" activity. Pt's eyes were deviated upon assessment, pt was not responding to verbal or tactile stimuli. MD Adriana Simasook notified.

## 2015-05-22 NOTE — ED Notes (Signed)
MD Adriana Simasook notified of pending lab result due to recalibrating machine.

## 2015-05-22 NOTE — ED Notes (Signed)
MD Cook at bedside. 

## 2015-05-22 NOTE — ED Notes (Signed)
Pt is very lethargic, once aroused pt is able to correctly answer name/DOB, location, and year.

## 2015-05-22 NOTE — ED Notes (Signed)
Pt reports he does not believe he took his medications today.

## 2015-05-22 NOTE — ED Provider Notes (Signed)
Discussed clinical scenario with Dr. Sharl MaLama. Admit to observation stepdown  Donnetta HutchingBrian Ercole Georg, MD 05/22/15 [phone]

## 2015-05-22 NOTE — ED Provider Notes (Signed)
CSN: [id number]     Arrival date & time 05/22/15  1917 History   First MD Initiated Contact with Patient 05/22/15 1927     Chief Complaint  Patient presents with  . Seizures     (Consider location/radiation/quality/duration/timing/severity/associated sxs/prior Treatment) HPI 55 year old male history of seizure disorder who has been started on Dilantin in the past. His wife states that he had a seizure again today. She states that he starts laughing inappropriately and has shaking all over. It lasted approximately 5 minutes. After he has a seizure she states that he becomes agitated and tries to leave. She called EMS and have him transported here. He is supposed to be on Dilantin 100 mg by mouth 3 times a day. She checked his pillbox and he has not taken any of today's the last one yesterday. He does not remember whether or not he took his medications. She states that he only drinks alcohol in his coffee which he drinks 3 times a day. He denies any headache or head injury. He has been out of work since last seizure was scheduled to go back in June. He is followed by his primary care doctor and Dr. Gerilyn Pilgrimoonquah for neurology. Past Medical History  Diagnosis Date  . Hypertension   . Wears glasses   . Seizures [facility](HCC)    Past Surgical History  Procedure Laterality Date  . Umbilical hernia repair  2007  . Tonsillectomy    . Wisdom tooth extraction    . Colonoscopy    . Shoulder arthroscopy with subacromial decompression Right 05/06/2013    Procedure: Right shoulder arthroscopy rotator cuff tear repair, subacromial decompression;  Surgeon: Mable ParisJustin William Chandler, MD;  Location: [facility];  Service: Orthopedics;  Laterality: Right;  Right shoulder arthroscopy rotator cuff tear repair, subacromial decompression   Family History  Problem Relation Age of Onset  . Hypertension Mother   . Breast cancer Sister    Social History  Substance Use Topics  . Smoking status: Current Every Day  Smoker -- 0.25 packs/day  . Smokeless tobacco: None  . Alcohol Use: Yes     Comment: occ    Review of Systems  All other systems reviewed and are negative.     Allergies  Penicillins  Home Medications   Prior to Admission medications   Medication Sig Start Date End Date Taking? Authorizing Provider  Amlodipine-Valsartan-HCTZ 10-160-25 MG TABS Take 1 tablet by mouth every morning. 03/04/15   Historical Provider, MD  aspirin 81 MG chewable tablet Chew 1 tablet (81 mg total) by mouth daily. 03/20/15   Rhetta MuraJai-Gurmukh Samtani, MD  nicotine (NICODERM CQ - DOSED IN MG/24 HOURS) 14 mg/24hr patch Place 1 patch (14 mg total) onto the skin daily. 03/20/15   Rhetta MuraJai-Gurmukh Samtani, MD  Omega-3 Fatty Acids (FISH OIL) 1000 MG CAPS Take by mouth.    Historical Provider, MD  phenytoin (DILANTIN) 100 MG ER capsule Take 1 capsule (100 mg total) by mouth 3 (three) times daily. 03/20/15   Rhetta MuraJai-Gurmukh Samtani, MD   BP 126/76 mmHg  Pulse 93  Temp(Src) 98.4 F (36.9 C) (Oral)  Resp 21  Ht 6\' 3"  (1.905 m)  Wt 104.327 kg  BMI 28.75 kg/m2  SpO2 95% Physical Exam  Constitutional: He is oriented to person, place, and time. He appears well-developed and well-nourished.  HENT:  Head: Normocephalic and atraumatic.  Right Ear: External ear normal.  Left Ear: External ear normal.  Nose: Nose normal.  Mouth/Throat: Oropharynx is clear and moist.  Tongue abrasion consistent with biting  Eyes: Conjunctivae and EOM are normal. Pupils are equal, round, and reactive to light.  Neck: Normal range of motion. Neck supple.  Cardiovascular: Normal rate, regular rhythm, normal heart sounds and intact distal pulses.   Pulmonary/Chest: Effort normal and breath sounds normal. No respiratory distress. He has no wheezes. He exhibits no tenderness.  Abdominal: Soft. Bowel sounds are normal. He exhibits no distension and no mass. There is no tenderness. There is no guarding.  Musculoskeletal: Normal range of motion.  Neurological:  He is alert and oriented to person, place, and time. He has normal reflexes. He displays normal reflexes. No cranial nerve deficit. Coordination normal.  Skin: Skin is warm and dry.  Psychiatric: He has a normal mood and affect. His behavior is normal. Judgment and thought content normal.  Nursing note and vitals reviewed.   ED Course  Procedures (including critical care time) Labs Review Labs Reviewed  CBC WITH DIFFERENTIAL/PLATELET - Abnormal; Notable for the following:    WBC 12.1 (*)    MCH 25.8 (*)    RDW 16.7 (*)    Monocytes Absolute 1.5 (*)    All other components within normal limits  CBG MONITORING, ED - Abnormal; Notable for the following:    Glucose-Capillary 112 (*)    All other components within normal limits  BASIC METABOLIC PANEL  PHENYTOIN LEVEL, TOTAL  ETHANOL    Imaging Review No results found. I have personally reviewed and evaluated these images and lab results as part of my medical decision-making.   EKG Interpretation None      MDM   Final diagnoses:  Seizure (HCC)    55 year old male with 3 previous seizures who presents today with a grand mal seizure consistent with his prior seizure disorder. He has not taken some of his Dilantin. There is also questionable whether these are related to alcohol and he did not have his usual alcohol intake today.  He began getting confused again today here which his wife describes as what happens before he has a seizure.  He was given ativan 1 mg iv and is now sleepy but awakes with verbal stimuli.  Dilantin level is pending. If dilantin level low, will observe and d/c if more awake.  If dilantin therapeutic, will require admission for further treatment.   Margarita Grizzle, MD 05/22/15 2206

## 2015-05-22 NOTE — ED Notes (Signed)
Pt resting with eyes closed, appears to be in no distress. Respirations are even and unlabored.  

## 2015-05-23 ENCOUNTER — Encounter (HOSPITAL_COMMUNITY): Payer: Self-pay | Admitting: General Practice

## 2015-05-23 DIAGNOSIS — R569 Unspecified convulsions: Secondary | ICD-10-CM

## 2015-05-23 DIAGNOSIS — Z8679 Personal history of other diseases of the circulatory system: Secondary | ICD-10-CM | POA: Diagnosis not present

## 2015-05-23 LAB — COMPREHENSIVE METABOLIC PANEL
ALBUMIN: 3.7 g/dL (ref 3.5–5.0)
ALK PHOS: 57 U/L (ref 38–126)
ALT: 28 U/L (ref 17–63)
ANION GAP: 9 (ref 5–15)
AST: 36 U/L (ref 15–41)
BILIRUBIN TOTAL: 0.3 mg/dL (ref 0.3–1.2)
BUN: 9 mg/dL (ref 6–20)
CALCIUM: 8.8 mg/dL — AB (ref 8.9–10.3)
CO2: 27 mmol/L (ref 22–32)
CREATININE: 0.86 mg/dL (ref 0.61–1.24)
Chloride: 103 mmol/L (ref 101–111)
GFR calc non Af Amer: >60 mL/min (ref >60)
GLUCOSE: 116 mg/dL — AB (ref 65–99)
Potassium: 4.1 mmol/L (ref 3.5–5.1)
SODIUM: 139 mmol/L (ref 135–145)
TOTAL PROTEIN: 7.4 g/dL (ref 6.5–8.1)

## 2015-05-23 LAB — CBC
HEMATOCRIT: 42.8 % (ref 39.0–52.0)
HEMOGLOBIN: 13.8 g/dL (ref 13.0–17.0)
MCH: 25.9 pg — AB (ref 26.0–34.0)
MCHC: 32.2 g/dL (ref 30.0–36.0)
MCV: 80.5 fL (ref 78.0–100.0)
Platelets: 359 10*3/uL (ref 150–400)
RBC: 5.32 MIL/uL (ref 4.22–5.81)
RDW: 16.8 % — ABNORMAL HIGH (ref 11.5–15.5)
WBC: 13.1 10*3/uL — ABNORMAL HIGH (ref 4.0–10.5)

## 2015-05-23 LAB — MRSA PCR SCREENING: MRSA by PCR: NEGATIVE

## 2015-05-23 MED ORDER — AMLODIPINE-VALSARTAN-HCTZ 10-160-25 MG PO TABS: 1.0000 | ORAL_TABLET | Freq: Every morning | ORAL | Status: DC

## 2015-05-23 MED ORDER — ASPIRIN 81 MG PO CHEW
81.0000 mg | CHEWABLE_TABLET | Freq: Every day | ORAL | Status: DC
Start: 2015-05-23 — End: 2015-05-24
  Administered 2015-05-23 – 2015-05-24 (×2): 81 mg via ORAL
  Filled 2015-05-23 (×2): qty 1

## 2015-05-23 MED ORDER — ENOXAPARIN SODIUM 40 MG/0.4ML ~~LOC~~ SOLN
40.0000 mg | SUBCUTANEOUS | Status: DC
  Administered 2015-05-23 – 2015-05-24 (×2): 40 mg via SUBCUTANEOUS
  Filled 2015-05-23 (×2): qty 0.4

## 2015-05-23 MED ORDER — LEVETIRACETAM 500 MG PO TABS
500.0000 mg | ORAL_TABLET | Freq: Two times a day (BID) | ORAL | Status: DC
  Administered 2015-05-23 – 2015-05-24 (×3): 500 mg via ORAL
  Filled 2015-05-23 (×3): qty 1

## 2015-05-23 MED ORDER — PHENYTOIN SODIUM EXTENDED 100 MG PO CAPS
100.0000 mg | ORAL_CAPSULE | Freq: Three times a day (TID) | ORAL | Status: DC
  Administered 2015-05-23: 100 mg via ORAL
  Filled 2015-05-23: qty 1

## 2015-05-23 MED ORDER — PNEUMOCOCCAL VAC POLYVALENT 25 MCG/0.5ML IJ INJ
0.5000 mL | INJECTION | INTRAMUSCULAR | Status: AC
  Administered 2015-05-24: 0.5 mL via INTRAMUSCULAR
  Filled 2015-05-23: qty 0.5

## 2015-05-23 MED ORDER — AMLODIPINE BESYLATE 5 MG PO TABS
10.0000 mg | ORAL_TABLET | Freq: Every day | ORAL | Status: DC
  Administered 2015-05-23 – 2015-05-24 (×2): 10 mg via ORAL
  Filled 2015-05-23 (×2): qty 2

## 2015-05-23 MED ORDER — PHENYTOIN SODIUM EXTENDED 100 MG PO CAPS
200.0000 mg | ORAL_CAPSULE | Freq: Two times a day (BID) | ORAL | Status: DC
  Administered 2015-05-23 – 2015-05-24 (×2): 200 mg via ORAL
  Filled 2015-05-23 (×2): qty 2

## 2015-05-23 MED ORDER — HYDROCHLOROTHIAZIDE 25 MG PO TABS
25.0000 mg | ORAL_TABLET | Freq: Every day | ORAL | Status: DC
  Administered 2015-05-23 – 2015-05-24 (×2): 25 mg via ORAL
  Filled 2015-05-23 (×2): qty 1

## 2015-05-23 MED ORDER — IRBESARTAN 150 MG PO TABS
150.0000 mg | ORAL_TABLET | Freq: Every day | ORAL | Status: DC
  Administered 2015-05-23 – 2015-05-24 (×2): 150 mg via ORAL
  Filled 2015-05-23 (×2): qty 1

## 2015-05-23 MED ORDER — SODIUM CHLORIDE 0.9 % IV SOLN
INTRAVENOUS | Status: AC
  Administered 2015-05-23: 01:00:00 via INTRAVENOUS

## 2015-05-23 MED ORDER — FOLIC ACID 1 MG PO TABS
1.0000 mg | ORAL_TABLET | Freq: Every day | ORAL | Status: DC
  Administered 2015-05-23 – 2015-05-24 (×2): 1 mg via ORAL
  Filled 2015-05-23 (×2): qty 1

## 2015-05-23 MED ORDER — LORAZEPAM 2 MG/ML IJ SOLN: 2.0000 mg | INTRAMUSCULAR | Status: DC | PRN

## 2015-05-23 NOTE — Progress Notes (Signed)
Report received from ED Evlyn CourierSarah Lashley RN.  Pt transferring from ED to AP-ICU bed 6, wife to bedside.  Seizure precautions implemented.  See admission assessment.

## 2015-05-23 NOTE — Progress Notes (Signed)
Patient admitted to the hospital earlier this morning by Dr. Sharl MaLama  Patient seen and examined. He does not have any complaints at this time. Exam is unremarkable  Patient was admitted to the hospital with breakthrough seizures. He is chronically on dilantin, and reports compliance. He reports missing one dose on 5/4. Dilantin level in ED was <2.5. He was loaded with dilantin in ED and continued on maintenance dose. Had 2 seizures in ED, but no further seizures since admission. Will try and discuss with Dr. Gerilyn Pilgrimoonquah regarding further management of seizures. Transfer to telemetry.  [name],Reginald

## 2015-05-23 NOTE — H&P (Addendum)
TRH H&P   Patient Demographics:    Reginald [name], is a 55 y.o. male  MRN: [id number]   DOB - Jun 13, 1960  Admit Date - 05/22/2015  Outpatient Primary MD for the patient is Sissy Hoff, MD  Referring MD/NP/PA: Dr. Adriana Simas  Outpatient Specialists: Dr Gerilyn Pilgrim    Patient coming from: home  Chief Complaint  Patient presents with  . Seizures      HPI:    Reginald [name]  is a 55 y.o. male, With history of seizure disorder, currently on Dilantin at home, was brought to the hospital for seizures. As per wife after patient woke up from sleep around 5 PM he started laughing inappropriately and started shaking which lasted for 5 minutes. After see if patient became agitated. He was brought to the ED via EMS. Patient is supposed to Dilantin 100 mg 3 times a day. At this time patient is very somnolent and unable to provide significant history. Patient usually drinks alcohol in his coffee everyday.  In the ED Dilantin level was  less than 2.5. Patient given 1 g of Dilantin load in the ED.    Review of systems:    In addition to the HPI above,  No Fever-chills, No Headache, No changes with Vision or hearing, No problems swallowing food or Liquids, No Chest pain, Cough or Shortness of Breath, No Abdominal pain, No Nausea or Vommitting, Bowel movements are regular, No Blood in stool or Urine, No dysuria, Positive rash on chest wall No new joints pains-aches,  No new weakness, tingling, numbness in any extremity,  A full 10 point Review of Systems was done, except as stated above, all other Review of Systems were negative.   With Past History of the following :    Past Medical History  Diagnosis Date  . Hypertension   . Wears glasses   . Seizures [facility])       Past Surgical History  Procedure Laterality Date  . Umbilical hernia repair  2007  . Tonsillectomy    . Wisdom tooth extraction    .  Colonoscopy    . Shoulder arthroscopy with subacromial decompression Right 05/06/2013    Procedure: Right shoulder arthroscopy rotator cuff tear repair, subacromial decompression;  Surgeon: Mable Paris, MD;  Location: [facility];  Service: Orthopedics;  Laterality: Right;  Right shoulder arthroscopy rotator cuff tear repair, subacromial decompression      Social History:     Social History  Substance Use Topics  . Smoking status: Current Every Day Smoker -- 0.25 packs/day  . Smokeless tobacco: Not on file  . Alcohol Use: Yes     Comment: occ     Lives - at home  Mobility - fully mobile without any assistive devices     Family History :     Family History  Problem Relation Age of Onset  . Hypertension Mother   . Breast cancer Sister      Home Medications:   Prior to Admission medications   Medication  Sig Start Date End Date Taking? Authorizing Provider  Amlodipine-Valsartan-HCTZ 10-160-25 MG TABS Take 1 tablet by mouth every morning. 03/04/15  Yes Historical Provider, MD  Omega-3 Fatty Acids (FISH OIL) 1000 MG CAPS Take 1 capsule by mouth daily.    Yes Historical Provider, MD  phenytoin (DILANTIN) 100 MG ER capsule Take 1 capsule (100 mg total) by mouth 3 (three) times daily. 03/20/15  Yes Rhetta MuraJai-Gurmukh Samtani, MD  aspirin 81 MG chewable tablet Chew 1 tablet (81 mg total) by mouth daily. 03/20/15   Rhetta MuraJai-Gurmukh Samtani, MD  nicotine (NICODERM CQ - DOSED IN MG/24 HOURS) 14 mg/24hr patch Place 1 patch (14 mg total) onto the skin daily. Patient not taking: Reported on 05/22/2015 03/20/15   Rhetta MuraJai-Gurmukh Samtani, MD     Allergies:     Allergies  Allergen Reactions  . Penicillins Itching         Physical Exam:   Vitals  Blood pressure 139/88, pulse 91, temperature 98.4 F (36.9 C), temperature source Oral, resp. rate 28, height 6\' 3"  (1.905 m), weight 104.327 kg (230 lb), SpO2 93 %.   1. General Well-built African-American male lying in bed in NAD,  cooperative with exam  2. Normal affect and insight, Not Suicidal or Homicidal, Awake Alert, Oriented X 3.  3. No F.N deficits, ALL C.Nerves Intact, Strength 5/5 all 4 extremities, Sensation intact all 4 extremities, Plantars down going.  4. Ears and Eyes appear Normal, Conjunctivae clear, PERRLA. Moist Oral Mucosa. No tongue laceration noted. Mild contusion noted on the left anterior aspect of tongue  5. Supple Neck, No JVD, No cervical lymphadenopathy appriciated, No Carotid Bruits.  6. Symmetrical Chest wall movement, Good air movement bilaterally, CTAB.  7. RRR, No Gallops, Rubs or Murmurs, No Parasternal Heave.  8. Positive Bowel Sounds, Abdomen Soft, No tenderness, No organomegaly appriciated,No rebound -guarding or rigidity.  9.  No Cyanosis, Normal Skin Turgor, erythematous rash noted on the skin of chest wall  10. Good muscle tone,  joints appear normal , no effusions, Normal ROM.      Data Review:    CBC  Recent Labs Lab 05/22/15 2020  WBC 12.1*  HGB 13.2  HCT 41.0  PLT 323  MCV 80.1  MCH 25.8*  MCHC 32.2  RDW 16.7*  LYMPHSABS 2.5  MONOABS 1.5*  EOSABS 0.4  BASOSABS 0.1   ------------------------------------------------------------------------------------------------------------------  Chemistries   Recent Labs Lab 05/22/15 2020  NA 136  K 3.6  CL 102  CO2 26  GLUCOSE 96  BUN 12  CREATININE 1.05  CALCIUM 8.8*   ------------------------------------------------------------------------------------------------------------------    ---------------------------------------------------------------------------------------------------------------  Urinalysis    Component Value Date/Time   COLORURINE YELLOW 03/17/2015 1600   APPEARANCEUR CLEAR 03/17/2015 1600   LABSPEC 1.020 03/17/2015 1600   PHURINE 5.5 03/17/2015 1600   GLUCOSEU NEGATIVE 03/17/2015 1600   HGBUR TRACE* 03/17/2015 1600   BILIRUBINUR NEGATIVE 03/17/2015 1600   KETONESUR  NEGATIVE 03/17/2015 1600   PROTEINUR NEGATIVE 03/17/2015 1600   NITRITE NEGATIVE 03/17/2015 1600   LEUKOCYTESUR NEGATIVE 03/17/2015 1600    ----------------------------------------------------------------------------------------------------------------   Imaging Results:       Assessment & Plan:    Active Problems:   History of hypertension   Seizure (HCC)     1. Seizure- likely from noncompliance, as Dilantin level was less than 2.5. Dilantin load 1 g given in the ED. Continue Dilantin 100 mg 3 times a day. Consulted neurology. Continue Ativan 2 mg IV every 4 hours when necessary for seizure. 2. Alcohol abuse-  patient drinks alcohol and coffee everyday, no signs and symptoms of alcohol withdrawal. Continue Ativan when necessary as above. Start thiamine and folate. 3. Hypertension- blood pressure is controlled, continue amlodipine/valsartan/HCTZ.    DVT Prophylaxis-   Lovenox   AM Labs Ordered, also please review Full Orders  Family Communication: Admission, patients condition and plan of care including tests being ordered have been discussed with the patient and  His wife at bedside who indicate understanding and agree with the plan and Code Status.  Code Status Full code  Admission status: Observation  Time spent in minutes : 60 min   Drewey Begue S M.D on 05/23/2015 at 12:30 AM  Between 7am to 7pm - Pager - [phone]. After 7pm go to [email or web address] - password [facility]  [facility] - Office  [phone]

## 2015-05-24 DIAGNOSIS — R569 Unspecified convulsions: Secondary | ICD-10-CM | POA: Diagnosis not present

## 2015-05-24 DIAGNOSIS — Z8679 Personal history of other diseases of the circulatory system: Secondary | ICD-10-CM

## 2015-05-24 DIAGNOSIS — F1010 Alcohol abuse, uncomplicated: Secondary | ICD-10-CM | POA: Diagnosis not present

## 2015-05-24 LAB — CBC
HEMATOCRIT: 42.8 % (ref 39.0–52.0)
Hemoglobin: 14 g/dL (ref 13.0–17.0)
MCH: 26.2 pg (ref 26.0–34.0)
MCHC: 32.7 g/dL (ref 30.0–36.0)
MCV: 80 fL (ref 78.0–100.0)
PLATELETS: 312 10*3/uL (ref 150–400)
RBC: 5.35 MIL/uL (ref 4.22–5.81)
RDW: 17 % — AB (ref 11.5–15.5)
WBC: 10.6 10*3/uL — AB (ref 4.0–10.5)

## 2015-05-24 LAB — BASIC METABOLIC PANEL
ANION GAP: 9 (ref 5–15)
BUN: 13 mg/dL (ref 6–20)
CO2: 27 mmol/L (ref 22–32)
Calcium: 8.7 mg/dL — ABNORMAL LOW (ref 8.9–10.3)
Chloride: 101 mmol/L (ref 101–111)
Creatinine, Ser: 1.07 mg/dL (ref 0.61–1.24)
GFR calc Af Amer: >60 mL/min (ref >60)
Glucose, Bld: 90 mg/dL (ref 65–99)
POTASSIUM: 3.8 mmol/L (ref 3.5–5.1)
SODIUM: 137 mmol/L (ref 135–145)

## 2015-05-24 MED ORDER — PHENYTOIN SODIUM EXTENDED 100 MG PO CAPS: 200.0000 mg | ORAL_CAPSULE | Freq: Two times a day (BID) | ORAL | Status: DC

## 2015-05-24 MED ORDER — LEVETIRACETAM 500 MG PO TABS: 500.0000 mg | ORAL_TABLET | Freq: Two times a day (BID) | ORAL | Status: DC

## 2015-05-24 NOTE — Discharge Summary (Signed)
Physician Discharge Summary  Reginald [name] ZOX:[id number] DOB: 1960-03-02 DOA: 05/22/2015  PCP: Sissy Hoff, MD  Admit date: 05/22/2015 Discharge date: 05/24/2015  Time spent: 35 minutes  Recommendations for Outpatient Follow-up:  1. Follow up with PCP in 1-2 weeks. 2. Follow up with neurology, Dr. Gerilyn Pilgrim, within 1 week.    Discharge Diagnoses:  Active Problems:   History of hypertension   Seizure (HCC)   Alcohol abuse   Discharge Condition: Improved  Diet recommendation: Heart Healthy  Filed Weights   05/22/15 1926 05/23/15 0042 05/24/15 0500  Weight: 104.327 kg (230 lb) 118 kg (260 lb 2.3 oz) 114.3 kg (251 lb 15.8 oz)    History of present illness:  55 y/o male with a hx of alcohol abuse and seizures presented s/p a seizure that occurred on 5/5. Patient is chronically on dilantin, and reports compliance however missed one dose on 5/4. Dilantin level in ED was <2.5. He was given a loading dose of Dilantin in the ED and continued on maintenance dose. Upon arrival, he had 2 seizures in ED, but has not had any further seizures since admission. Neurology has been consulted.   Hospital Course:  Seizures. Patient presented after having a seizure at home. He stated that he is chronically on Dilantin and has been compliant however missed a dose on 5/4. While in the ED, his Dilantin level was <2.5. He was given a 1g load of Dilantin in the ED and started on  3x a day. He has not had any recurring seizures since admission. Case was discussed with Dr. Gerilyn Pilgrim who recommends increasing his Dilantin to  BID upon discharge and starting him on Keppra  BID. He will follow up with him as an outpatient. Patient feels as though he is back to his baseline.   1. Hypertension, stable. Continue home medications.  2. Alcohol abuse. No signs of withdrawal.   Procedures:  none  Consultations:  Neurology  Discharge Exam: Filed Vitals:   05/24/15 0758 05/24/15 0800  BP:   120/82  Pulse:    Temp: 98 F (36.7 C)   Resp:       General: NAD, looks comfortable  Cardiovascular: RRR, S1, S2   Respiratory: clear bilaterally, No wheezing, rales or rhonchi  Abdomen: soft, non tender, no distention , bowel sounds normal  Musculoskeletal: No edema b/l  Discharge Instructions   Discharge Instructions    Diet - low sodium heart healthy    Complete by:  As directed      Increase activity slowly    Complete by:  As directed           Current Discharge Medication List    START taking these medications   Details  levETIRAcetam (KEPPRA) 500 MG tablet Take 1 tablet (500 mg total) by mouth 2 (two) times daily. Qty: 60 tablet, Refills: 1      CONTINUE these medications which have CHANGED   Details  phenytoin (DILANTIN) 100 MG ER capsule Take 2 capsules (200 mg total) by mouth 2 (two) times daily. Qty: 120 capsule, Refills: 0      CONTINUE these medications which have NOT CHANGED   Details  Amlodipine-Valsartan-HCTZ 10-160-25 MG TABS Take 1 tablet by mouth eRip Hawes. Refills: 0    aspirin 81 MG chewable tablet Chew 1 tablet (81 mg total) by mouth daily. Qty: 30 tablet, Refills: 0    Omega-3 Fatty Acids (FISH OIL) 1000 MG CAPS Take 1 capsule by mouth daily.     tadalafil (CIALIS)  5 MG tablet Take 5 mg by mouth daily as needed for erectile dysfunction.    nicotine (NICODERM CQ - DOSED IN MG/24 HOURS) 14 mg/24hr patch Place 1 patch (14 mg total) onto the skin daily. Qty: 28 patch, Refills: 0       Allergies  Allergen Reactions  . Penicillins Itching   Follow-up Information    Follow up with Beryle BeamsONQUAH, KOFI, MD.   Specialty:  Neurology   Why:  call for appointment   Contact information:   [address] [place] [address] [phone]        The results of significant diagnostics from this hospitalization (including imaging, microbiology, ancillary and laboratory) are listed below for reference.     Microbiology: Recent  Results (from the past 240 hour(s))  MRSA PCR Screening     Status: None   Collection Time: 05/23/15 12:30 AM  Result Value Ref Range Status   MRSA by PCR NEGATIVE NEGATIVE Final    Comment:        The GeneXpert MRSA Assay (FDA approved for NASAL specimens only), is one component of a comprehensive MRSA colonization surveillance program. It is not intended to diagnose MRSA infection nor to guide or monitor treatment for MRSA infections.      Labs: Basic Metabolic Panel:  Recent Labs Lab 05/22/15 2020 05/23/15 0429 05/24/15 0524  NA 136 139 137  K 3.6 4.1 3.8  CL 102 103 101  CO2 26 27 27   GLUCOSE 96 116* 90  BUN 12 9 13   CREATININE 1.05 0.86 1.07  CALCIUM 8.8* 8.8* 8.7*   Liver Function Tests:  Recent Labs Lab 05/23/15 0429  AST 36  ALT 28  ALKPHOS 57  BILITOT 0.3  PROT 7.4  ALBUMIN 3.7    CBC:  Recent Labs Lab 05/22/15 2020 05/23/15 0429 05/24/15 0524  WBC 12.1* 13.1* 10.6*  NEUTROABS 7.7  --   --   HGB 13.2 13.8 14.0  HCT 41.0 42.8 42.8  MCV 80.1 80.5 80.0  PLT 323 359 312    CBG:  Recent Labs Lab 05/22/15 1950  GLUCAP 112*     Signed: Jehanzeb Memon. MD [facility] 05/24/2015, 11:22 AM    By signing my name below, I, Burnett HarryJennifer Gregorio, attest that this documentation has been prepared under the direction and in the presence of [facility] LLCJehanzeb Memon. MD Electronically Signed: Burnett HarryJennifer Gregorio, Scribe. 05/24/2015  I, Dr. Erick BlinksJehanzeb Memon, personally performed the services described in this documentaiton. All medical record entries made by the scribe were at my direction and in my presence. I have reviewed the chart and agree that the record reflects my personal performance and is accurate and complete  Erick BlinksJehanzeb Memon, MD, 05/24/2015 11:22 AM

## 2015-05-24 NOTE — Progress Notes (Signed)
Orders received for discharge; instructions and medications discussed with patient and all questions were answered; verbalized understanding of new medications; prescriptions were given to pt; IV removed with no complications; [facility].

## 2015-10-15 ENCOUNTER — Other Ambulatory Visit: Payer: Self-pay

## 2015-10-15 DIAGNOSIS — G4733 Obstructive sleep apnea (adult) (pediatric): Secondary | ICD-10-CM

## 2015-10-28 ENCOUNTER — Ambulatory Visit: Payer: Self-pay

## 2016-03-24 ENCOUNTER — Other Ambulatory Visit (HOSPITAL_BASED_OUTPATIENT_CLINIC_OR_DEPARTMENT_OTHER): Payer: Self-pay

## 2016-03-24 DIAGNOSIS — G4730 Sleep apnea, unspecified: Secondary | ICD-10-CM

## 2016-04-05 ENCOUNTER — Ambulatory Visit: Payer: 59 | Attending: Neurology | Admitting: Neurology

## 2016-04-05 DIAGNOSIS — G4733 Obstructive sleep apnea (adult) (pediatric): Secondary | ICD-10-CM | POA: Insufficient documentation

## 2016-04-05 DIAGNOSIS — G4730 Sleep apnea, unspecified: Secondary | ICD-10-CM

## 2016-04-18 NOTE — Procedures (Signed)
[facility] Tanayah Squitieri A. Gerilyn Pilgrim, MD     [email or web address]             NOCTURNAL POLYSOMNOGRAPHY   LOCATION: [facility]  Patient Name: Reginald [name], Reginald [name] Date: 04/05/2016 Gender: Male D.O.B: [birth year] Age (years): 33 Referring Provider: Beryle Beams MD, ABSM Height (inches): 75 Interpreting Physician: Beryle Beams MD, ABSM Weight (lbs): 295 RPSGT: Alfonso Ellis BMI: 37 MRN: [id number] Neck Size: 18.50 CLINICAL INFORMATION The patient is referred for a CPAP titration to treat sleep apnea.  Date of NPSG, Split Night or HST:  SLEEP STUDY TECHNIQUE As per the AASM Manual for the Scoring of Sleep and Associated Events v2.3 (April 2016) with a hypopnea requiring 4% desaturations.  The channels recorded and monitored were frontal, central and occipital EEG, electrooculogram (EOG), submentalis EMG (chin), nasal and oral airflow, thoracic and abdominal wall motion, anterior tibialis EMG, snore microphone, electrocardiogram, and pulse oximetry. Continuous positive airway pressure (CPAP) was initiated at the beginning of the study and titrated to treat sleep-disordered breathing.  MEDICATIONS Medications self-administered by patient taken the night of the study : N/A  Current Outpatient Prescriptions:  .  Amlodipine-Valsartan-HCTZ 10-160-25 MG TABS, Take 1 tablet by mouth every morning., Disp: , Rfl: 0 .  aspirin 81 MG chewable tablet, Chew 1 tablet (81 mg total) by mouth daily., Disp: 30 tablet, Rfl: 0 .  levETIRAcetam (KEPPRA) 500 MG tablet, Take 1 tablet (500 mg total) by mouth 2 (two) times daily., Disp: 60 tablet, Rfl: 1 .  nicotine (NICODERM CQ - DOSED IN MG/24 HOURS) 14 mg/24hr patch, Place 1 patch (14 mg total) onto the skin daily. (Patient not taking: Reported on 05/22/2015), Disp: 28 patch, Rfl: 0 .  Omega-3 Fatty Acids (FISH OIL) 1000 MG CAPS, Take 1 capsule by mouth daily. , Disp: , Rfl:  .  phenytoin (DILANTIN) 100 MG ER capsule, Take 2 capsules (200 mg  total) by mouth 2 (two) times daily., Disp: 120 capsule, Rfl: 0 .  tadalafil (CIALIS) 5 MG tablet, Take 5 mg by mouth daily as needed for erectile dysfunction., Disp: , Rfl:    TECHNICIAN COMMENTS Comments added by technician: Patient tolerated CPAP well. Suboptimal pressure obtained due to supine REM stages  Comments added by scorer: N/A RESPIRATORY PARAMETERS Optimal PAP Pressure (cm): 16 AHI at Optimal Pressure (/hr): 3.6 Overall Minimal O2 (%): 79.00 Supine % at Optimal Pressure (%): 28 Minimal O2 at Optimal Pressure (%): 86.0       There were mild related desaturations associated with apneic events. This is suggestive of hypoventilation syndrome. SLEEP ARCHITECTURE The study was initiated at 10:54:01 PM and ended at 5:14:19 AM.  Sleep onset time was 19.7 minutes and the sleep efficiency was 89.8%. The total sleep time was 341.6 minutes.  The patient spent 0.73% of the night in stage N1 sleep, 41.88% in stage N2 sleep, 29.28% in stage N3 and 28.11% in REM.Stage REM latency was 46.0 minutes  Wake after sleep onset was 19.0. Alpha intrusion was absent. Supine sleep was 37.64%.  CARDIAC DATA The 2 lead EKG demonstrated sinus rhythm. The mean heart rate was N/A beats per minute. Other EKG findings include: Atrial Fibrillation. LEG MOVEMENT DATA The total Periodic Limb Movements of Sleep (PLMS) were 0. The PLMS index was 0.00. A PLMS index of <15 is considered normal in adults.  IMPRESSIONS - The optimal CPAP is 16 cm of water. - Mild related desaturations associated with apneic events suggestive of hypoventilation syndrome. Consider nocturnal oximetry about a month after the  patient has been on positive pressure treatment.    Argie Ramming, MD Diplomate, American Board of Sleep Medicine.

## 2017-03-28 ENCOUNTER — Emergency Department (HOSPITAL_COMMUNITY)
Admission: EM | Admit: 2017-03-28 | Discharge: 2017-03-28 | Disposition: A | Discharge status: Home / Self Care | Payer: Managed Care, Other (non HMO) | Attending: Emergency Medicine | Admitting: Emergency Medicine

## 2017-03-28 ENCOUNTER — Encounter (HOSPITAL_COMMUNITY): Payer: Self-pay

## 2017-03-28 DIAGNOSIS — I10 Essential (primary) hypertension: Secondary | ICD-10-CM | POA: Diagnosis not present

## 2017-03-28 DIAGNOSIS — Z79899 Other long term (current) drug therapy: Secondary | ICD-10-CM | POA: Insufficient documentation

## 2017-03-28 DIAGNOSIS — F17200 Nicotine dependence, unspecified, uncomplicated: Secondary | ICD-10-CM | POA: Insufficient documentation

## 2017-03-28 DIAGNOSIS — Z7982 Long term (current) use of aspirin: Secondary | ICD-10-CM | POA: Insufficient documentation

## 2017-03-28 DIAGNOSIS — R569 Unspecified convulsions: Secondary | ICD-10-CM | POA: Insufficient documentation

## 2017-03-28 LAB — PHENYTOIN LEVEL, TOTAL

## 2017-03-28 LAB — COMPREHENSIVE METABOLIC PANEL
ALT: 23 U/L (ref 17–63)
AST: 30 U/L (ref 15–41)
Albumin: 3.9 g/dL (ref 3.5–5.0)
Alkaline Phosphatase: 72 U/L (ref 38–126)
Anion gap: 11 (ref 5–15)
BUN: 10 mg/dL (ref 6–20)
CHLORIDE: 101 mmol/L (ref 101–111)
CO2: 24 mmol/L (ref 22–32)
CREATININE: 0.94 mg/dL (ref 0.61–1.24)
Calcium: 9.5 mg/dL (ref 8.9–10.3)
Glucose, Bld: 97 mg/dL (ref 65–99)
POTASSIUM: 3.8 mmol/L (ref 3.5–5.1)
SODIUM: 136 mmol/L (ref 135–145)
Total Bilirubin: 0.6 mg/dL (ref 0.3–1.2)
Total Protein: 7.7 g/dL (ref 6.5–8.1)

## 2017-03-28 LAB — CBC WITH DIFFERENTIAL/PLATELET
BASOS ABS: 0 10*3/uL (ref 0.0–0.1)
Basophils Relative: 0 %
EOS ABS: 0 10*3/uL (ref 0.0–0.7)
EOS PCT: 1 %
HCT: 45.5 % (ref 39.0–52.0)
Hemoglobin: 14.3 g/dL (ref 13.0–17.0)
LYMPHS ABS: 1.3 10*3/uL (ref 0.7–4.0)
Lymphocytes Relative: 19 %
MCH: 25 pg — AB (ref 26.0–34.0)
MCHC: 31.4 g/dL (ref 30.0–36.0)
MCV: 79.5 fL (ref 78.0–100.0)
MONO ABS: 0.4 10*3/uL (ref 0.1–1.0)
Monocytes Relative: 6 %
Neutro Abs: 4.9 10*3/uL (ref 1.7–7.7)
Neutrophils Relative %: 74 %
PLATELETS: 401 10*3/uL — AB (ref 150–400)
RBC: 5.72 MIL/uL (ref 4.22–5.81)
RDW: 16.3 % — AB (ref 11.5–15.5)
WBC: 6.6 10*3/uL (ref 4.0–10.5)

## 2017-03-28 MED ORDER — LORAZEPAM 2 MG/ML IJ SOLN
1.0000 mg | Freq: Once | INTRAMUSCULAR | Status: AC
  Administered 2017-03-28: 1 mg via INTRAVENOUS
  Filled 2017-03-28: qty 1

## 2017-03-28 MED ORDER — LEVETIRACETAM IN NACL 1000 MG/100ML IV SOLN
1000.0000 mg | Freq: Once | INTRAVENOUS | Status: AC
  Administered 2017-03-28: 1000 mg via INTRAVENOUS
  Filled 2017-03-28: qty 100

## 2017-03-28 MED ORDER — LORAZEPAM 1 MG PO TABS: ORAL_TABLET | ORAL | 0 refills | Status: AC

## 2017-03-28 NOTE — ED Triage Notes (Signed)
EMS reports pt's wife witnessed pt having a seizure.  Reports pt had a seizure Feb 21 and they added another seizure medication.  Pt presently alert and oriented but shaking.  Pt says is normal for him after a seizure.  Denies any pain.

## 2017-03-28 NOTE — ED Provider Notes (Signed)
[facility] EMERGENCY DEPARTMENT Provider Note   CSN: [id number] Arrival date & time: 03/28/17  0755     History   Chief Complaint Chief Complaint  Patient presents with  . Seizures    HPI Reginald [name] is a 57 y.o. male.  Patient and wife states the patient has had a seizure.  Patient has a history of seizures   The history is provided by the patient. No language interpreter was used.  Seizures   This is a recurrent problem. The current episode started less than 1 hour ago. The problem has been resolved. There was 1 seizure. The most recent episode lasted less than 30 seconds. Pertinent negatives include no headaches, no chest pain, no cough and no diarrhea. Characteristics include eye blinking.    Past Medical History:  Diagnosis Date  . Hypertension   . Seizures (HCC)   . Wears glasses     Patient Active Problem List   Diagnosis Date Noted  . Alcohol abuse 05/24/2015  . Seizure (HCC) 05/22/2015  . Aspiration pneumonia (HCC) 03/17/2015  . 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 History of hypertension 03/17/2015  . Convulsions/seizures (HCC) 03/17/2015  . 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 Hypokalemia     Past Surgical History:  Procedure Laterality Date  . COLONOSCOPY    . SHOULDER ARTHROSCOPY WITH SUBACROMIAL DECOMPRESSION Right 05/06/2013   Procedure: Right shoulder arthroscopy rotator cuff tear repair, subacromial decompression;  Surgeon: Mable Paris, MD;  Location: [facility];  Service: Orthopedics;  Laterality: Right;  Right shoulder arthroscopy rotator cuff tear repair, subacromial decompression  . TONSILLECTOMY    . UMBILICAL HERNIA REPAIR  2007  . WISDOM TOOTH EXTRACTION         Home Medications    Prior to Admission medications   Medication Sig Start Date End Date Taking? Authorizing Provider  amLODipine (NORVASC) 10 MG tablet Take 10 mg by mouth daily. 03/11/17  Yes [provider]  aspirin 81 MG chewable tablet Chew 1 tablet (81 mg total) by mouth daily. 03/20/15  Yes Rhetta Mura, MD  Eslicarbazepine Acetate (APTIOM) 400 MG TABS Take 1 tablet by mouth 2 (two) times daily.   Yes [provider]  hydrochlorothiazide (HYDRODIURIL) 25 MG tablet Take 1 tablet by mouth every morning. 03/02/17  Yes [provider]  levETIRAcetam (KEPPRA) 750 MG tablet Take 2 tablets by mouth 2 (two) times daily. 03/02/17  Yes [provider]  olmesartan (BENICAR) 20 MG tablet Take 20 mg by mouth daily. 03/01/17  Yes [provider]  tadalafil (CIALIS) 5 MG tablet Take 5 mg by mouth daily as needed for erectile dysfunction.   Yes [provider]  levETIRAcetam (KEPPRA) 500 MG tablet Take 1 tablet (500 mg total) by mouth 2 (two) times daily. Patient not taking: Reported on 03/28/2017 05/24/15   Erick Blinks, MD  LORazepam (ATIVAN) 1 MG tablet Take 1 every 6 hours if needed for minor seizures or shaking or anxiety 03/28/17   Bethann Berkshire, MD  nicotine (NICODERM CQ - DOSED IN MG/24 HOURS) 14 mg/24hr patch Place 1 patch (14 mg total) onto the skin daily. Patient not taking: Reported on 05/22/2015 03/20/15   Rhetta Mura, MD  Omega-3 Fatty Acids (FISH OIL) 1000 MG CAPS Take 1 capsule by mouth daily.     [provider]  phenytoin (DILANTIN) 100 MG ER capsule Take 2 capsules (200 mg total) by mouth 2 (two) times daily.  Patient not taking: Reported on 03/28/2017 05/24/15   Erick Blinks, MD    Family History Family History  Problem Relation Age of Onset  . Hypertension Mother   . Breast cancer Sister     Social History Social History   Tobacco Use  . Smoking status: Current Every Day Smoker    Packs/day: 0.25  . Smokeless tobacco: Current User  Substance Use Topics  . Alcohol use: Yes    Comment: occ  . Drug use: Yes    Types: Marijuana    Comment: former     Allergies   Penicillins   Review of  Systems Review of Systems  Constitutional: Negative for appetite change and fatigue.  HENT: Negative for congestion, ear discharge and sinus pressure.   Eyes: Negative for discharge.  Respiratory: Negative for cough.   Cardiovascular: Negative for chest pain.  Gastrointestinal: Negative for abdominal pain and diarrhea.  Genitourinary: Negative for frequency and hematuria.  Musculoskeletal: Negative for back pain.  Skin: Negative for rash.  Neurological: Positive for seizures. Negative for headaches.  Psychiatric/Behavioral: Negative for hallucinations.     Physical Exam Updated Vital Signs BP 133/85   Pulse 67   Temp 97.9 F (36.6 C) (Oral)   Resp (!) 21   Ht 6\' 3"  (1.905 m)   Wt 115.2 kg (254 lb)   SpO2 94%   BMI 31.75 kg/m   Physical Exam  Constitutional: He is oriented to person, place, and time. He appears well-developed.  HENT:  Head: Normocephalic.  Eyes: Conjunctivae and EOM are normal. No scleral icterus.  Neck: Neck supple. No thyromegaly present.  Cardiovascular: Normal rate and regular rhythm. Exam reveals no gallop and no friction rub.  No murmur heard. Pulmonary/Chest: No stridor. He has no wheezes. He has no rales. He exhibits no tenderness.  Abdominal: He exhibits no distension. There is no tenderness. There is no rebound.  Musculoskeletal: Normal range of motion. He exhibits no edema.  Lymphadenopathy:    He has no cervical adenopathy.  Neurological: He is oriented to person, place, and time. He exhibits normal muscle tone. Coordination normal.  Skin: No rash noted. No erythema.  Psychiatric: He has a normal mood and affect. His behavior is normal.     ED Treatments / Results  Labs (all labs ordered are listed, but only abnormal results are displayed) Labs Reviewed  CBC WITH DIFFERENTIAL/PLATELET - Abnormal; Notable for the following components:      Result Value   MCH 25.0 (*)    RDW 16.3 (*)    Platelets 401 (*)    All other components within  normal limits  PHENYTOIN LEVEL, TOTAL - Abnormal; Notable for the following components:   Phenytoin Lvl <2.5 (*)    All other components within normal limits  COMPREHENSIVE METABOLIC PANEL    EKG  EKG Interpretation None       Radiology No results found.  Procedures Procedures (including critical care time)  Medications Ordered in ED Medications  LORazepam (ATIVAN) injection 1 mg (1 mg Intravenous Given 03/28/17 0848)  levETIRAcetam (KEPPRA) IVPB 1000 mg/100 mL premix (0 mg Intravenous Stopped 03/28/17 1022)     Initial Impression / Assessment and Plan / ED Course  I have reviewed the triage vital signs and the nursing notes.  Pertinent labs & imaging results that were available during my care of the patient were reviewed by me and considered in my medical decision making (see chart for details). Patient had what his wife and he said  with a seizure while I was examining him.  The patient was able to talk and answer questions appropriately and just was shaking.  I think this is more like a pseudoseizure.  Patient improved with Ativan.   Patient was given 1 g of Keppra which she is taking 750 twice a day.  And he is also on another seizure medicine like Tegretol.  I attempted to get in touch with his neurologist but his neurologist is out of town.  Patient was given a prescription of Ativan to take if he has these shaking spells and he is to see his neurologist next week      Final Clinical Impressions(s) / ED Diagnoses   Final diagnoses:  Seizure [facility](HCC)    ED Discharge Orders        Ordered    LORazepam (ATIVAN) 1 MG tablet     03/28/17 1349       Bethann BerkshireZammit, Gedalya Jim, MD 03/28/17 1354

## 2017-03-28 NOTE — ED Notes (Signed)
Pt continues to have shaking movements.  Pt responsive.  Wife at bedside.  Pt crying.  Per wife "he normally starts crying when he starting to have another seizure".

## 2017-03-28 NOTE — ED Notes (Signed)
Pt resting with eyes shut.  No distress.  

## 2017-03-28 NOTE — ED Notes (Signed)
Paced pt on [employer]ie oxygen.

## 2017-03-28 NOTE — ED Notes (Signed)
Pt awakens easily.  Oriented.  States he feels ok .

## 2017-03-28 NOTE — Discharge Instructions (Signed)
Follow-up with your neurologist next week for recheck

## 2017-04-29 ENCOUNTER — Encounter (HOSPITAL_COMMUNITY): Payer: Self-pay | Admitting: Emergency Medicine

## 2017-04-29 ENCOUNTER — Other Ambulatory Visit: Payer: Self-pay

## 2017-04-29 ENCOUNTER — Emergency Department (HOSPITAL_COMMUNITY)
Admission: EM | Admit: 2017-04-29 | Discharge: 2017-04-29 | Disposition: A | Discharge status: Home / Self Care | Payer: Managed Care, Other (non HMO) | Attending: Emergency Medicine | Admitting: Emergency Medicine

## 2017-04-29 DIAGNOSIS — Z7982 Long term (current) use of aspirin: Secondary | ICD-10-CM | POA: Insufficient documentation

## 2017-04-29 DIAGNOSIS — Z79899 Other long term (current) drug therapy: Secondary | ICD-10-CM | POA: Diagnosis not present

## 2017-04-29 DIAGNOSIS — R569 Unspecified convulsions: Secondary | ICD-10-CM | POA: Diagnosis present

## 2017-04-29 DIAGNOSIS — I10 Essential (primary) hypertension: Secondary | ICD-10-CM | POA: Diagnosis not present

## 2017-04-29 DIAGNOSIS — F17210 Nicotine dependence, cigarettes, uncomplicated: Secondary | ICD-10-CM | POA: Insufficient documentation

## 2017-04-29 DIAGNOSIS — E876 Hypokalemia: Secondary | ICD-10-CM | POA: Insufficient documentation

## 2017-04-29 LAB — I-STAT CHEM 8, ED
BUN: 6 mg/dL (ref 6–20)
CALCIUM ION: 1.12 mmol/L — AB (ref 1.15–1.40)
CREATININE: 0.9 mg/dL (ref 0.61–1.24)
Chloride: 94 mmol/L — ABNORMAL LOW (ref 101–111)
GLUCOSE: 112 mg/dL — AB (ref 65–99)
HCT: 46 % (ref 39.0–52.0)
Hemoglobin: 15.6 g/dL (ref 13.0–17.0)
Potassium: 3 mmol/L — ABNORMAL LOW (ref 3.5–5.1)
SODIUM: 134 mmol/L — AB (ref 135–145)
TCO2: 25 mmol/L (ref 22–32)

## 2017-04-29 LAB — COMPREHENSIVE METABOLIC PANEL
ALBUMIN: 4 g/dL (ref 3.5–5.0)
ALT: 24 U/L (ref 17–63)
AST: 32 U/L (ref 15–41)
Alkaline Phosphatase: 64 U/L (ref 38–126)
Anion gap: 11 (ref 5–15)
BILIRUBIN TOTAL: 0.4 mg/dL (ref 0.3–1.2)
BUN: 8 mg/dL (ref 6–20)
CO2: 26 mmol/L (ref 22–32)
Calcium: 9.1 mg/dL (ref 8.9–10.3)
Chloride: 94 mmol/L — ABNORMAL LOW (ref 101–111)
Creatinine, Ser: 0.9 mg/dL (ref 0.61–1.24)
GFR calc Af Amer: >60 mL/min (ref >60)
GFR calc non Af Amer: >60 mL/min (ref >60)
GLUCOSE: 113 mg/dL — AB (ref 65–99)
POTASSIUM: 3 mmol/L — AB (ref 3.5–5.1)
Sodium: 131 mmol/L — ABNORMAL LOW (ref 135–145)
TOTAL PROTEIN: 7.8 g/dL (ref 6.5–8.1)

## 2017-04-29 LAB — CBC WITH DIFFERENTIAL/PLATELET
BASOS PCT: 0 %
Basophils Absolute: 0 10*3/uL (ref 0.0–0.1)
EOS ABS: 0 10*3/uL (ref 0.0–0.7)
EOS PCT: 0 %
HCT: 42.9 % (ref 39.0–52.0)
Hemoglobin: 14.3 g/dL (ref 13.0–17.0)
Lymphocytes Relative: 14 %
Lymphs Abs: 1.8 10*3/uL (ref 0.7–4.0)
MCH: 25.6 pg — ABNORMAL LOW (ref 26.0–34.0)
MCHC: 33.3 g/dL (ref 30.0–36.0)
MCV: 76.7 fL — ABNORMAL LOW (ref 78.0–100.0)
MONO ABS: 1.3 10*3/uL (ref 0.1–1.0)
MONOS PCT: 9 %
NEUTROS PCT: 77 %
Neutro Abs: 10.2 10*3/uL (ref 1.7–7.7)
PLATELETS: 349 10*3/uL (ref 150–400)
RBC: 5.59 MIL/uL (ref 4.22–5.81)
RDW: 15.5 % (ref 11.5–15.5)
WBC: 13.2 10*3/uL — ABNORMAL HIGH (ref 4.0–10.5)

## 2017-04-29 MED ORDER — ONDANSETRON HCL 4 MG/2ML IJ SOLN
4.0000 mg | Freq: Once | INTRAMUSCULAR | Status: AC
  Administered 2017-04-29: 4 mg via INTRAVENOUS
  Filled 2017-04-29: qty 2

## 2017-04-29 MED ORDER — LEVETIRACETAM IN NACL 1000 MG/100ML IV SOLN
1000.0000 mg | Freq: Once | INTRAVENOUS | Status: AC
  Administered 2017-04-29: 1000 mg via INTRAVENOUS
  Filled 2017-04-29: qty 100

## 2017-04-29 MED ORDER — LORAZEPAM 2 MG/ML IJ SOLN
1.0000 mg | Freq: Once | INTRAMUSCULAR | Status: AC
  Administered 2017-04-29: 1 mg via INTRAVENOUS
  Filled 2017-04-29: qty 1

## 2017-04-29 MED ORDER — POTASSIUM CHLORIDE CRYS ER 20 MEQ PO TBCR
40.0000 meq | EXTENDED_RELEASE_TABLET | Freq: Once | ORAL | Status: AC
Start: 2017-04-29 — End: 2017-04-29
  Administered 2017-04-29: 40 meq via ORAL
  Filled 2017-04-29: qty 2

## 2017-04-29 MED ORDER — POTASSIUM CHLORIDE ER 10 MEQ PO TBCR
10.0000 meq | EXTENDED_RELEASE_TABLET | Freq: Every day | ORAL | 0 refills | Status: DC
Start: 2017-04-29 — End: 2017-09-21

## 2017-04-29 NOTE — Discharge Instructions (Addendum)
Follow-up with your family doctor next week to check your potassium.  Call your neurologist and let him know he had an episode this weekend

## 2017-04-29 NOTE — ED Triage Notes (Signed)
unwitnessed seizure.  Pt son walked in and pt was in post ictal state.  Pt is awake and alert at this time.  Small lac to rt side of tongue with no bleeding.

## 2017-04-29 NOTE — ED Notes (Signed)
Pt family reports pt had a focal seizure lasting around 1 min.  This was not observed by staff,  edp called to bedside.  edp states we will keep pt for a while for observation.

## 2017-04-30 NOTE — ED Provider Notes (Signed)
[facility] EMERGENCY DEPARTMENT Provider Note   CSN: [id number] Arrival date & time: 04/29/17  1948     History   Chief Complaint Chief Complaint  Patient presents with  . Seizures    HPI Reginald [name] is a 57 y.o. male.  Patient has a history of seizures.  According to his wife he had a seizure but it sounds more like he had shaking episodes.  Patient did not lose consciousness  The history is provided by the patient and a relative. No language interpreter was used.  Seizures   This is a recurrent problem. The current episode started less than 1 hour ago. The problem has not changed since onset.There was 1 seizure. Associated symptoms include sleepiness. Pertinent negatives include no confusion, no headaches, no chest pain, no cough and no diarrhea. Tremors The episode was not witnessed. There was no sensation of an aura present. The seizures did not continue in the ED. The seizure(s) had no focality. There has been no fever.    Past Medical History:  Diagnosis Date  . Hypertension   . Seizures (HCC)   . Wears glasses     Patient Active Problem List   Diagnosis Date Noted  . Alcohol abuse 05/24/2015  . Seizure (HCC) 05/22/2015  . Aspiration pneumonia (HCC) 03/17/2015  . 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 History of hypertension 03/17/2015  . Convulsions/seizures (HCC) 03/17/2015  . 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 Hypokalemia     Past Surgical History:  Procedure Laterality Date  . COLONOSCOPY    . SHOULDER ARTHROSCOPY WITH SUBACROMIAL DECOMPRESSION Right 05/06/2013   Procedure: Right shoulder arthroscopy rotator cuff tear repair, subacromial decompression;  Surgeon: Mable Paris, MD;  Location: [facility];  Service: Orthopedics;  Laterality: Right;  Right shoulder arthroscopy rotator cuff tear repair, subacromial decompression  . TONSILLECTOMY    . UMBILICAL HERNIA REPAIR  2007  . WISDOM TOOTH  EXTRACTION          Home Medications    Prior to Admission medications   Medication Sig Start Date End Date Taking? Authorizing Provider  amLODipine (NORVASC) 10 MG tablet Take 10 mg by mouth daily. 03/11/17   [provider]  aspirin 81 MG chewable tablet Chew 1 tablet (81 mg total) by mouth daily. 03/20/15   Rhetta Mura, MD  Eslicarbazepine Acetate (APTIOM) 400 MG TABS Take 1 tablet by mouth 2 (two) times daily.    [provider]  hydrochlorothiazide (HYDRODIURIL) 25 MG tablet Take 1 tablet by mouth every morning. 03/02/17   [provider]  levETIRAcetam (KEPPRA) 500 MG tablet Take 1 tablet (500 mg total) by mouth 2 (two) times daily. Patient not taking: Reported on 03/28/2017 05/24/15   Erick Blinks, MD  levETIRAcetam (KEPPRA) 750 MG tablet Take 2 tablets by mouth 2 (two) times daily. 03/02/17   [provider]  LORazepam (ATIVAN) 1 MG tablet Take 1 every 6 hours if needed for minor seizures or shaking or anxiety 03/28/17   Bethann Berkshire, MD  nicotine (NICODERM CQ - DOSED IN MG/24 HOURS) 14 mg/24hr patch Place 1 patch (14 mg total) onto the skin daily. Patient not taking: Reported on 05/22/2015 03/20/15   Rhetta Mura, MD  olmesartan (BENICAR) 20 MG tablet Take 20 mg by mouth daily. 03/01/17   [provider]  Omega-3 Fatty Acids (FISH OIL) 1000 MG CAPS Take 1 capsule by mouth daily.     [provider]  phenytoin (DILANTIN) 100 MG ER capsule Take 2 capsules (200 mg total) by mouth 2 (two) times daily. Patient not taking: Reported on 03/28/2017 05/24/15   Erick Blinks, MD  potassium chloride (K-DUR) 10 MEQ tablet Take 1 tablet (10 mEq total) by mouth daily. 04/29/17   Bethann Berkshire, MD  tadalafil (CIALIS) 5 MG tablet Take 5 mg by mouth daily as needed for erectile dysfunction.    [provider]    Family History Family History  Problem Relation Age of Onset  . Hypertension Mother   . Breast cancer Sister      Social History Social History   Tobacco Use  . Smoking status: Current Every Day Smoker    Packs/day: 0.25  . Smokeless tobacco: Current User  Substance Use Topics  . Alcohol use: Yes    Comment: occ  . Drug use: Yes    Types: Marijuana    Comment: former     Allergies   Penicillins   Review of Systems Review of Systems  Constitutional: Negative for appetite change and fatigue.  HENT: Negative for congestion, ear discharge and sinus pressure.   Eyes: Negative for discharge.  Respiratory: Negative for cough.   Cardiovascular: Negative for chest pain.  Gastrointestinal: Negative for abdominal pain and diarrhea.  Genitourinary: Negative for frequency and hematuria.  Musculoskeletal: Negative for back pain.  Skin: Negative for rash.  Neurological: Positive for seizures. Negative for headaches.  Psychiatric/Behavioral: Negative for confusion and hallucinations.     Physical Exam Updated Vital Signs BP 127/86   Pulse 80   Temp 98.6 F (37 C) (Oral)   Resp (!) 21   Ht 6\' 2"  (1.88 m)   Wt 115.2 kg (254 lb)   SpO2 95%   BMI 32.61 kg/m   Physical Exam  Constitutional: He is oriented to person, place, and time. He appears well-developed.  HENT:  Head: Normocephalic.  Eyes: Conjunctivae and EOM are normal. No scleral icterus.  Neck: Neck supple. No thyromegaly present.  Cardiovascular: Normal rate and regular rhythm. Exam reveals no gallop and no friction rub.  No murmur heard. Pulmonary/Chest: No stridor. He has no wheezes. He has no rales. He exhibits no tenderness.  Abdominal: He exhibits no distension. There is no tenderness. There is no rebound.  Musculoskeletal: Normal range of motion. He exhibits no edema.  Lymphadenopathy:    He has no cervical adenopathy.  Neurological: He is oriented to person, place, and time. He exhibits normal muscle tone. Coordination normal.  Patient very anxious and having tremors  Skin: No rash noted. No erythema.   Psychiatric: He has a normal mood and affect. His behavior is normal.     ED Treatments / Results  Labs (all labs ordered are listed, but only abnormal results are displayed) Labs Reviewed  CBC WITH DIFFERENTIAL/PLATELET - Abnormal; Notable for the following components:      Result Value   WBC 13.2 (*)    MCV 76.7 (*)    MCH 25.6 (*)    All other components within normal limits  COMPREHENSIVE METABOLIC PANEL - Abnormal; Notable for the following components:   Sodium 131 (*)    Potassium 3.0 (*)    Chloride 94 (*)    Glucose, Bld 113 (*)    All other components within normal limits  I-STAT CHEM 8, ED - Abnormal; Notable for the following components:   Sodium 134 (*)    Potassium 3.0 (*)    Chloride 94 (*)    Glucose, Bld  112 (*)    Calcium, Ion 1.12 (*)    All other components within normal limits    EKG None  Radiology No results found.  Procedures Procedures (including critical care time)  Medications Ordered in ED Medications  LORazepam (ATIVAN) injection 1 mg (1 mg Intravenous Given 04/29/17 2000)  levETIRAcetam (KEPPRA) IVPB 1000 mg/100 mL premix (0 mg Intravenous Stopped 04/29/17 2120)  potassium chloride SA (K-DUR,KLOR-CON) CR tablet 40 mEq (40 mEq Oral Given 04/29/17 2128)  ondansetron (ZOFRAN) injection 4 mg (4 mg Intravenous Given 04/29/17 2153)     Initial Impression / Assessment and Plan / ED Course  I have reviewed the triage vital signs and the nursing notes.  Pertinent labs & imaging results that were available during my care of the patient were reviewed by me and considered in my medical decision making (see chart for details).     Patient was given Ativan for tremors possible seizure.  Labs unremarkable except for low potassium which was replaced.  Patient was also given some Keppra because he had not had his dose that afternoon.  I am not sure the patient is actually having seizures this could be a type of pseudoseizure I know he has an appointment  with neurology to get a EEG done.  He will be sent home for follow-up  Final Clinical Impressions(s) / ED Diagnoses   Final diagnoses:  Seizure (HCC)  Hypokalemia    ED Discharge Orders        Ordered    potassium chloride (K-DUR) 10 MEQ tablet  Daily     04/29/17 2157       Bethann BerkshireZammit, Climmie Cronce, MD 04/30/17 2056

## 2017-09-19 ENCOUNTER — Encounter (HOSPITAL_COMMUNITY): Payer: Self-pay | Admitting: *Deleted

## 2017-09-19 ENCOUNTER — Inpatient Hospital Stay (HOSPITAL_COMMUNITY): Payer: No Typology Code available for payment source

## 2017-09-19 ENCOUNTER — Emergency Department (HOSPITAL_COMMUNITY): Payer: No Typology Code available for payment source

## 2017-09-19 ENCOUNTER — Inpatient Hospital Stay (HOSPITAL_COMMUNITY)
Admission: EM | Admit: 2017-09-19 | Discharge: 2017-09-25 | DRG: 100 | Disposition: A | Discharge status: Home / Self Care | Payer: No Typology Code available for payment source | Attending: Internal Medicine | Admitting: Internal Medicine

## 2017-09-19 ENCOUNTER — Other Ambulatory Visit: Payer: Self-pay

## 2017-09-19 ENCOUNTER — Inpatient Hospital Stay (HOSPITAL_COMMUNITY)
Admit: 2017-09-19 | Discharge: 2017-09-19 | Disposition: A | Discharge status: Home / Self Care | Payer: No Typology Code available for payment source | Attending: Neurology | Admitting: Neurology

## 2017-09-19 DIAGNOSIS — J690 Pneumonitis due to inhalation of food and vomit: Secondary | ICD-10-CM | POA: Diagnosis present

## 2017-09-19 DIAGNOSIS — F1010 Alcohol abuse, uncomplicated: Secondary | ICD-10-CM | POA: Diagnosis not present

## 2017-09-19 DIAGNOSIS — J9602 Acute respiratory failure with hypercapnia: Secondary | ICD-10-CM | POA: Diagnosis present

## 2017-09-19 DIAGNOSIS — F10239 Alcohol dependence with withdrawal, unspecified: Secondary | ICD-10-CM | POA: Diagnosis present

## 2017-09-19 DIAGNOSIS — E876 Hypokalemia: Secondary | ICD-10-CM | POA: Diagnosis not present

## 2017-09-19 DIAGNOSIS — J449 Chronic obstructive pulmonary disease, unspecified: Secondary | ICD-10-CM | POA: Diagnosis present

## 2017-09-19 DIAGNOSIS — G4730 Sleep apnea, unspecified: Secondary | ICD-10-CM | POA: Diagnosis present

## 2017-09-19 DIAGNOSIS — K922 Gastrointestinal hemorrhage, unspecified: Secondary | ICD-10-CM | POA: Diagnosis present

## 2017-09-19 DIAGNOSIS — Z880 Allergy status to penicillin: Secondary | ICD-10-CM | POA: Diagnosis not present

## 2017-09-19 DIAGNOSIS — E872 Acidosis, unspecified: Secondary | ICD-10-CM | POA: Diagnosis present

## 2017-09-19 DIAGNOSIS — G9349 Other encephalopathy: Secondary | ICD-10-CM | POA: Diagnosis present

## 2017-09-19 DIAGNOSIS — N179 Acute kidney failure, unspecified: Secondary | ICD-10-CM | POA: Diagnosis not present

## 2017-09-19 DIAGNOSIS — F17210 Nicotine dependence, cigarettes, uncomplicated: Secondary | ICD-10-CM | POA: Diagnosis present

## 2017-09-19 DIAGNOSIS — J9601 Acute respiratory failure with hypoxia: Secondary | ICD-10-CM | POA: Diagnosis present

## 2017-09-19 DIAGNOSIS — F1290 Cannabis use, unspecified, uncomplicated: Secondary | ICD-10-CM | POA: Diagnosis present

## 2017-09-19 DIAGNOSIS — K2971 Gastritis, unspecified, with bleeding: Secondary | ICD-10-CM | POA: Diagnosis present

## 2017-09-19 DIAGNOSIS — Z8249 Family history of ischemic heart disease and other diseases of the circulatory system: Secondary | ICD-10-CM | POA: Diagnosis not present

## 2017-09-19 DIAGNOSIS — G40901 Epilepsy, unspecified, not intractable, with status epilepticus: Secondary | ICD-10-CM

## 2017-09-19 DIAGNOSIS — I959 Hypotension, unspecified: Secondary | ICD-10-CM | POA: Diagnosis present

## 2017-09-19 DIAGNOSIS — J9690 Respiratory failure, unspecified, unspecified whether with hypoxia or hypercapnia: Secondary | ICD-10-CM

## 2017-09-19 DIAGNOSIS — R823 Hemoglobinuria: Secondary | ICD-10-CM | POA: Diagnosis present

## 2017-09-19 DIAGNOSIS — I10 Essential (primary) hypertension: Secondary | ICD-10-CM | POA: Diagnosis present

## 2017-09-19 DIAGNOSIS — Z8679 Personal history of other diseases of the circulatory system: Secondary | ICD-10-CM

## 2017-09-19 DIAGNOSIS — M6282 Rhabdomyolysis: Secondary | ICD-10-CM | POA: Diagnosis present

## 2017-09-19 DIAGNOSIS — Z9114 Patient's other noncompliance with medication regimen: Secondary | ICD-10-CM

## 2017-09-19 DIAGNOSIS — K2970 Gastritis, unspecified, without bleeding: Secondary | ICD-10-CM | POA: Diagnosis not present

## 2017-09-19 DIAGNOSIS — R809 Proteinuria, unspecified: Secondary | ICD-10-CM | POA: Diagnosis present

## 2017-09-19 DIAGNOSIS — Z4659 Encounter for fitting and adjustment of other gastrointestinal appliance and device: Secondary | ICD-10-CM

## 2017-09-19 DIAGNOSIS — K219 Gastro-esophageal reflux disease without esophagitis: Secondary | ICD-10-CM | POA: Diagnosis present

## 2017-09-19 DIAGNOSIS — Z720 Tobacco use: Secondary | ICD-10-CM | POA: Diagnosis not present

## 2017-09-19 DIAGNOSIS — Z7982 Long term (current) use of aspirin: Secondary | ICD-10-CM | POA: Diagnosis not present

## 2017-09-19 DIAGNOSIS — Z79899 Other long term (current) drug therapy: Secondary | ICD-10-CM

## 2017-09-19 HISTORY — DX: Gastro-esophageal reflux disease without esophagitis: K21.9

## 2017-09-19 LAB — BLOOD GAS, ARTERIAL
ACID-BASE DEFICIT: 0.4 mmol/L (ref 0.0–2.0)
Bicarbonate: 21.1 mmol/L (ref 20.0–28.0)
Bicarbonate: 23.8 mmol/L (ref 20.0–28.0)
Drawn by: 22223
Drawn by: 22223
FIO2: 100
FIO2: 45
LHR: 18 {breaths}/min
LHR: 16 {breaths}/min
MECHVT: 600 mL
MECHVT: 650 mL
O2 Saturation: 97.8 %
O2 Saturation: 93.2 %
PEEP: 5 cmH2O
PEEP: 5 cmH2O
PO2 ART: 128 mmHg — AB (ref 83.0–108.0)
pCO2 arterial: 43.1 mmHg (ref 32.0–48.0)
pCO2 arterial: 42.7 mmHg (ref 32.0–48.0)
pH, Arterial: 7.318 — ABNORMAL LOW (ref 7.350–7.450)
pH, Arterial: 7.371 (ref 7.350–7.450)
pO2, Arterial: 75.5 mmHg — ABNORMAL LOW (ref 83.0–108.0)

## 2017-09-19 LAB — COMPREHENSIVE METABOLIC PANEL
ALBUMIN: 4.5 g/dL (ref 3.5–5.0)
ALK PHOS: 77 U/L (ref 38–126)
ALT: 38 U/L (ref 0–44)
AST: 47 U/L — AB (ref 15–41)
Anion gap: 24 — ABNORMAL HIGH (ref 5–15)
BUN: 12 mg/dL (ref 6–20)
CALCIUM: 9.2 mg/dL (ref 8.9–10.3)
CHLORIDE: 102 mmol/L (ref 98–111)
CO2: 14 mmol/L — AB (ref 22–32)
CREATININE: 1.34 mg/dL — AB (ref 0.61–1.24)
GFR calc non Af Amer: 58 mL/min — ABNORMAL LOW (ref >60)
GLUCOSE: 234 mg/dL — AB (ref 70–99)
Potassium: 3.2 mmol/L — ABNORMAL LOW (ref 3.5–5.1)
SODIUM: 140 mmol/L (ref 135–145)
Total Bilirubin: 0.7 mg/dL (ref 0.3–1.2)
Total Protein: 8.8 g/dL — ABNORMAL HIGH (ref 6.5–8.1)

## 2017-09-19 LAB — HEMOGLOBIN AND HEMATOCRIT, BLOOD
HCT: 38.6 % — ABNORMAL LOW (ref 39.0–52.0)
HEMATOCRIT: 43.5 % (ref 39.0–52.0)
HEMATOCRIT: 41.3 % (ref 39.0–52.0)
HEMOGLOBIN: 13.3 g/dL (ref 13.0–17.0)
HEMOGLOBIN: 12.5 g/dL — AB (ref 13.0–17.0)
Hemoglobin: 14.3 g/dL (ref 13.0–17.0)

## 2017-09-19 LAB — TYPE AND SCREEN
ABO/RH(D): A POS
ABO/RH(D): A POS
ANTIBODY SCREEN: NEGATIVE
Antibody Screen: NEGATIVE

## 2017-09-19 LAB — BASIC METABOLIC PANEL
ANION GAP: 7 (ref 5–15)
BUN: 13 mg/dL (ref 6–20)
CO2: 24 mmol/L (ref 22–32)
Calcium: 8.2 mg/dL — ABNORMAL LOW (ref 8.9–10.3)
Chloride: 108 mmol/L (ref 98–111)
Creatinine, Ser: 1.22 mg/dL (ref 0.61–1.24)
Glucose, Bld: 125 mg/dL — ABNORMAL HIGH (ref 70–99)
Potassium: 4.4 mmol/L (ref 3.5–5.1)
Sodium: 139 mmol/L (ref 135–145)

## 2017-09-19 LAB — URINALYSIS, ROUTINE W REFLEX MICROSCOPIC
BILIRUBIN URINE: NEGATIVE
Glucose, UA: NEGATIVE mg/dL
Ketones, ur: NEGATIVE mg/dL
LEUKOCYTES UA: NEGATIVE
Nitrite: NEGATIVE
PH: 5 (ref 5.0–8.0)
Protein, ur: 100 mg/dL — AB
SPECIFIC GRAVITY, URINE: 1.014 (ref 1.005–1.030)

## 2017-09-19 LAB — CBG MONITORING, ED: Glucose-Capillary: 196 mg/dL — ABNORMAL HIGH (ref 70–99)

## 2017-09-19 LAB — CBC WITH DIFFERENTIAL/PLATELET
BASOS PCT: 0 %
Basophils Absolute: 0.1 10*3/uL (ref 0.0–0.1)
EOS ABS: 0.1 10*3/uL (ref 0.0–0.7)
EOS PCT: 1 %
HCT: 48.3 % (ref 39.0–52.0)
HEMOGLOBIN: 15.4 g/dL (ref 13.0–17.0)
LYMPHS ABS: 5.8 10*3/uL — AB (ref 0.7–4.0)
Lymphocytes Relative: 40 %
MCH: 26.6 pg (ref 26.0–34.0)
MCHC: 31.9 g/dL (ref 30.0–36.0)
MCV: 83.6 fL (ref 78.0–100.0)
Monocytes Absolute: 1.1 10*3/uL — ABNORMAL HIGH (ref 0.1–1.0)
Monocytes Relative: 8 %
NEUTROS PCT: 51 %
Neutro Abs: 7.3 10*3/uL (ref 1.7–7.7)
PLATELETS: 400 10*3/uL (ref 150–400)
RBC: 5.78 MIL/uL (ref 4.22–5.81)
RDW: 15.6 % — ABNORMAL HIGH (ref 11.5–15.5)
WBC: 14.4 10*3/uL — ABNORMAL HIGH (ref 4.0–10.5)

## 2017-09-19 LAB — CBC
HCT: 37.8 % — ABNORMAL LOW (ref 39.0–52.0)
HEMOGLOBIN: 12.2 g/dL — AB (ref 13.0–17.0)
MCH: 26.1 pg (ref 26.0–34.0)
MCHC: 32.3 g/dL (ref 30.0–36.0)
MCV: 80.8 fL (ref 78.0–100.0)
Platelets: 300 10*3/uL (ref 150–400)
RBC: 4.68 MIL/uL (ref 4.22–5.81)
RDW: 15.8 % — ABNORMAL HIGH (ref 11.5–15.5)
WBC: 12.1 10*3/uL — AB (ref 4.0–10.5)

## 2017-09-19 LAB — TRIGLYCERIDES
Triglycerides: 242 mg/dL — ABNORMAL HIGH (ref <150)
Triglycerides: 202 mg/dL — ABNORMAL HIGH (ref <150)

## 2017-09-19 LAB — RAPID URINE DRUG SCREEN, HOSP PERFORMED
AMPHETAMINES: NOT DETECTED
BENZODIAZEPINES: NOT DETECTED
Barbiturates: NOT DETECTED
Cocaine: NOT DETECTED
Opiates: NOT DETECTED
Tetrahydrocannabinol: POSITIVE — AB

## 2017-09-19 LAB — I-STAT CG4 LACTIC ACID, ED
Lactic Acid, Venous: >17 mmol/L (ref 0.5–1.9)
Lactic Acid, Venous: 4.84 mmol/L (ref 0.5–1.9)

## 2017-09-19 LAB — MAGNESIUM: Magnesium: 2.5 mg/dL — ABNORMAL HIGH (ref 1.7–2.4)

## 2017-09-19 LAB — GLUCOSE, CAPILLARY
GLUCOSE-CAPILLARY: 112 mg/dL — AB (ref 70–99)
GLUCOSE-CAPILLARY: 111 mg/dL — AB (ref 70–99)
Glucose-Capillary: 102 mg/dL — ABNORMAL HIGH (ref 70–99)
Glucose-Capillary: 131 mg/dL — ABNORMAL HIGH (ref 70–99)
Glucose-Capillary: 137 mg/dL — ABNORMAL HIGH (ref 70–99)

## 2017-09-19 LAB — PROTIME-INR
INR: 1.14
Prothrombin Time: 14.6 seconds (ref 11.4–15.2)

## 2017-09-19 LAB — PHOSPHORUS: Phosphorus: 2.6 mg/dL (ref 2.5–4.6)

## 2017-09-19 LAB — TROPONIN I

## 2017-09-19 LAB — LACTIC ACID, PLASMA
Lactic Acid, Venous: 4.3 mmol/L (ref 0.5–1.9)
Lactic Acid, Venous: 1.3 mmol/L (ref 0.5–1.9)

## 2017-09-19 LAB — APTT: aPTT: 46 seconds — ABNORMAL HIGH (ref 24–36)

## 2017-09-19 LAB — MRSA PCR SCREENING: MRSA by PCR: NEGATIVE

## 2017-09-19 MED ORDER — SODIUM CHLORIDE 0.9 % IV SOLN
80.0000 mg | Freq: Once | INTRAVENOUS | Status: AC
  Administered 2017-09-19: 80 mg via INTRAVENOUS
  Filled 2017-09-19: qty 80

## 2017-09-19 MED ORDER — PROPOFOL 1000 MG/100ML IV EMUL
INTRAVENOUS | Status: AC
  Filled 2017-09-19: qty 100

## 2017-09-19 MED ORDER — SODIUM CHLORIDE 0.9 % IV SOLN
50.0000 ug/h | INTRAVENOUS | Status: DC
  Administered 2017-09-19: 50 ug/h via INTRAVENOUS
  Filled 2017-09-19 (×4): qty 1

## 2017-09-19 MED ORDER — FENTANYL 2500MCG IN NS 250ML (10MCG/ML) PREMIX INFUSION
25.0000 ug/h | INTRAVENOUS | Status: DC
  Filled 2017-09-19: qty 250

## 2017-09-19 MED ORDER — SUCCINYLCHOLINE CHLORIDE 20 MG/ML IJ SOLN
100.0000 mg | Freq: Once | INTRAMUSCULAR | Status: AC
  Administered 2017-09-19: 100 mg via INTRAVENOUS

## 2017-09-19 MED ORDER — PROPOFOL 1000 MG/100ML IV EMUL
0.0000 ug/kg/min | INTRAVENOUS | Status: DC
  Administered 2017-09-19 (×3): 10 ug/kg/min via INTRAVENOUS
  Filled 2017-09-19: qty 100
  Filled 2017-09-19: qty 200
  Filled 2017-09-19 (×7): qty 100
  Filled 2017-09-19: qty 200
  Filled 2017-09-19 (×5): qty 100

## 2017-09-19 MED ORDER — FENTANYL CITRATE (PF) 100 MCG/2ML IJ SOLN
100.0000 ug | INTRAMUSCULAR | Status: DC | PRN
  Administered 2017-09-19 – 2017-09-22 (×3): 100 ug via INTRAVENOUS
  Filled 2017-09-19: qty 2

## 2017-09-19 MED ORDER — LEVETIRACETAM IN NACL 1000 MG/100ML IV SOLN
1000.0000 mg | Freq: Once | INTRAVENOUS | Status: AC
  Administered 2017-09-19: 1000 mg via INTRAVENOUS
  Filled 2017-09-19: qty 100

## 2017-09-19 MED ORDER — FENTANYL CITRATE (PF) 100 MCG/2ML IJ SOLN
INTRAMUSCULAR | Status: AC
  Administered 2017-09-19: 100 ug
  Filled 2017-09-19: qty 2

## 2017-09-19 MED ORDER — NICARDIPINE HCL IN NACL 20-0.86 MG/200ML-% IV SOLN
INTRAVENOUS | Status: AC
  Filled 2017-09-19: qty 200

## 2017-09-19 MED ORDER — ONDANSETRON HCL 4 MG/2ML IJ SOLN
4.0000 mg | Freq: Once | INTRAMUSCULAR | Status: AC
  Administered 2017-09-19: 4 mg via INTRAVENOUS
  Filled 2017-09-19: qty 2

## 2017-09-19 MED ORDER — OCTREOTIDE LOAD VIA INFUSION
100.0000 ug | Freq: Once | INTRAVENOUS | Status: AC
  Administered 2017-09-19: 100 ug via INTRAVENOUS
  Filled 2017-09-19: qty 50

## 2017-09-19 MED ORDER — POTASSIUM CHLORIDE 10 MEQ/100ML IV SOLN
10.0000 meq | INTRAVENOUS | Status: AC
  Administered 2017-09-19 (×3): 10 meq via INTRAVENOUS
  Filled 2017-09-19 (×2): qty 100

## 2017-09-19 MED ORDER — ONDANSETRON HCL 4 MG/2ML IJ SOLN: 4.0000 mg | Freq: Four times a day (QID) | INTRAMUSCULAR | Status: DC | PRN

## 2017-09-19 MED ORDER — VALPROATE SODIUM 500 MG/5ML IV SOLN
INTRAVENOUS | Status: AC
  Filled 2017-09-19: qty 5

## 2017-09-19 MED ORDER — FENTANYL CITRATE (PF) 2500 MCG/50ML IJ SOLN
INTRAMUSCULAR | Status: AC
  Filled 2017-09-19: qty 50

## 2017-09-19 MED ORDER — FENTANYL CITRATE (PF) 100 MCG/2ML IJ SOLN: 100.0000 ug | INTRAMUSCULAR | Status: DC | PRN

## 2017-09-19 MED ORDER — PANTOPRAZOLE SODIUM 40 MG IV SOLR
INTRAVENOUS | Status: AC
  Filled 2017-09-19: qty 160

## 2017-09-19 MED ORDER — SODIUM CHLORIDE 0.9 % IV SOLN
INTRAVENOUS | Status: DC
  Administered 2017-09-19: 03:00:00 via INTRAVENOUS

## 2017-09-19 MED ORDER — ACETAMINOPHEN 325 MG PO TABS: 650.0000 mg | ORAL_TABLET | Freq: Four times a day (QID) | ORAL | Status: DC | PRN

## 2017-09-19 MED ORDER — VALPROATE SODIUM 500 MG/5ML IV SOLN
500.0000 mg | Freq: Two times a day (BID) | INTRAVENOUS | Status: DC
  Administered 2017-09-19 – 2017-09-24 (×10): 500 mg via INTRAVENOUS
  Filled 2017-09-19 (×12): qty 5

## 2017-09-19 MED ORDER — ACETAMINOPHEN 650 MG RE SUPP: 650.0000 mg | Freq: Four times a day (QID) | RECTAL | Status: DC | PRN

## 2017-09-19 MED ORDER — PROPOFOL 1000 MG/100ML IV EMUL: 5.0000 ug/kg/min | Freq: Once | INTRAVENOUS | Status: DC

## 2017-09-19 MED ORDER — LEVETIRACETAM IN NACL 1500 MG/100ML IV SOLN
1500.0000 mg | Freq: Two times a day (BID) | INTRAVENOUS | Status: DC
  Administered 2017-09-20 – 2017-09-24 (×9): 1500 mg via INTRAVENOUS
  Filled 2017-09-19 (×9): qty 100

## 2017-09-19 MED ORDER — ENOXAPARIN SODIUM 40 MG/0.4ML ~~LOC~~ SOLN: 40.0000 mg | SUBCUTANEOUS | Status: DC

## 2017-09-19 MED ORDER — FENTANYL BOLUS VIA INFUSION
50.0000 ug | INTRAVENOUS | Status: DC | PRN
  Administered 2017-09-19 – 2017-09-23 (×16): 50 ug via INTRAVENOUS
  Filled 2017-09-19: qty 50

## 2017-09-19 MED ORDER — SODIUM CHLORIDE 0.9 % IV SOLN
8.0000 mg/h | INTRAVENOUS | Status: DC
  Administered 2017-09-19 – 2017-09-21 (×6): 8 mg/h via INTRAVENOUS
  Filled 2017-09-19 (×9): qty 80

## 2017-09-19 MED ORDER — SODIUM CHLORIDE 0.9 % IV BOLUS
1000.0000 mL | Freq: Once | INTRAVENOUS | Status: AC
  Administered 2017-09-19: 1000 mL via INTRAVENOUS

## 2017-09-19 MED ORDER — MIDAZOLAM HCL 5 MG/5ML IJ SOLN
INTRAMUSCULAR | Status: AC
  Administered 2017-09-19: 5 mg via INTRAVENOUS
  Filled 2017-09-19: qty 5

## 2017-09-19 MED ORDER — ORAL CARE MOUTH RINSE
15.0000 mL | OROMUCOSAL | Status: DC
  Administered 2017-09-19 – 2017-09-23 (×37): 15 mL via OROMUCOSAL

## 2017-09-19 MED ORDER — FENTANYL CITRATE (PF) 100 MCG/2ML IJ SOLN
50.0000 ug | Freq: Once | INTRAMUSCULAR | Status: AC
  Administered 2017-09-19: 50 ug via INTRAVENOUS
  Filled 2017-09-19: qty 2

## 2017-09-19 MED ORDER — IPRATROPIUM-ALBUTEROL 0.5-2.5 (3) MG/3ML IN SOLN
3.0000 mL | Freq: Four times a day (QID) | RESPIRATORY_TRACT | Status: DC
  Administered 2017-09-19 – 2017-09-25 (×24): 3 mL via RESPIRATORY_TRACT
  Filled 2017-09-19 (×24): qty 3

## 2017-09-19 MED ORDER — CHLORHEXIDINE GLUCONATE 0.12% ORAL RINSE (MEDLINE KIT)
15.0000 mL | Freq: Two times a day (BID) | OROMUCOSAL | Status: DC
  Administered 2017-09-19 – 2017-09-23 (×9): 15 mL via OROMUCOSAL

## 2017-09-19 MED ORDER — POTASSIUM CHLORIDE IN NACL 20-0.45 MEQ/L-% IV SOLN
INTRAVENOUS | Status: DC
  Administered 2017-09-19: 03:00:00 via INTRAVENOUS
  Filled 2017-09-19 (×5): qty 1000

## 2017-09-19 MED ORDER — OCTREOTIDE ACETATE 500 MCG/ML IJ SOLN
INTRAMUSCULAR | Status: AC
  Filled 2017-09-19: qty 1

## 2017-09-19 MED ORDER — MIDAZOLAM HCL 5 MG/5ML IJ SOLN
INTRAMUSCULAR | Status: AC
  Filled 2017-09-19: qty 5

## 2017-09-19 MED ORDER — SODIUM CHLORIDE 0.9 % IV SOLN
25.0000 ug/h | INTRAVENOUS | Status: DC
  Administered 2017-09-19: 50 ug/h via INTRAVENOUS
  Administered 2017-09-20: 150 ug/h via INTRAVENOUS
  Administered 2017-09-20: 300 ug/h via INTRAVENOUS
  Administered 2017-09-21: 250 ug/h via INTRAVENOUS
  Filled 2017-09-19 (×2): qty 50

## 2017-09-19 MED ORDER — LEVETIRACETAM IN NACL 1000 MG/100ML IV SOLN
1000.0000 mg | Freq: Two times a day (BID) | INTRAVENOUS | Status: DC
  Administered 2017-09-19: 1000 mg via INTRAVENOUS
  Filled 2017-09-19: qty 100

## 2017-09-19 MED ORDER — PROPOFOL 1000 MG/100ML IV EMUL
5.0000 ug/kg/min | INTRAVENOUS | Status: DC
  Administered 2017-09-19: 30 ug/kg/min via INTRAVENOUS
  Administered 2017-09-19: 40 ug/kg/min via INTRAVENOUS
  Filled 2017-09-19: qty 100

## 2017-09-19 MED ORDER — ETOMIDATE 2 MG/ML IV SOLN
20.0000 mg | Freq: Once | INTRAVENOUS | Status: AC
  Administered 2017-09-19: 20 mg via INTRAVENOUS

## 2017-09-19 MED ORDER — LACTATED RINGERS IV SOLN
INTRAVENOUS | Status: DC
  Administered 2017-09-19 – 2017-09-21 (×3): via INTRAVENOUS

## 2017-09-19 MED ORDER — LORAZEPAM 2 MG/ML IJ SOLN
2.0000 mg | INTRAMUSCULAR | Status: DC | PRN
  Administered 2017-09-19 – 2017-09-20 (×2): 2 mg via INTRAVENOUS
  Filled 2017-09-19 (×2): qty 1

## 2017-09-19 MED ORDER — ONDANSETRON HCL 4 MG PO TABS: 4.0000 mg | ORAL_TABLET | Freq: Four times a day (QID) | ORAL | Status: DC | PRN

## 2017-09-19 MED ORDER — POTASSIUM CHLORIDE IN NACL 20-0.45 MEQ/L-% IV SOLN
INTRAVENOUS | Status: AC
  Administered 2017-09-19: 06:00:00 via INTRAVENOUS
  Filled 2017-09-19: qty 1000

## 2017-09-19 MED ORDER — MIDAZOLAM HCL 5 MG/5ML IJ SOLN
5.0000 mg | Freq: Once | INTRAMUSCULAR | Status: AC
  Administered 2017-09-19 (×2): 5 mg via INTRAVENOUS

## 2017-09-19 NOTE — Consult Note (Signed)
Referring Provider: Dr. Olevia Bowens  Primary Care Physician:  Antony Contras, MD Primary Gastroenterologist:  Historically seen in [place] remote past, wife does not recall name. Would like to transfer care here in [place]. Dr. Gala Romney.   Date of Admission: 09/19/17 Date of Consultation: 09/19/17  Reason for Consultation:  Concern for upper GI Bleed   HPI:  Reginald [name] is a 57 y.o. year old male with history of seizures, presenting to the ED this morning via EMS with status epilepticus. GI consulted due to concern for upper GI bleed after NG tube was inserted and coffee-ground contents filled the suction container. He is sedated in the ICU and has been agitated this morning. History obtained solely from nursing staff and wife, Reginald [name], who is a good historian and at the bedside.  Wife denies any prior hematemesis, evidence of overt GI bleeding (melena, hematochezia), any chronic upper GI symptoms, no dysphagia, no change in bowel habits, no abdominal pain. States her husband does note lower back discomfort when needing to have a BM, then resolving. Appetite has been decreasing recently. No known history of liver disease. Drinks 2 tbs of flavored bourbon in his coffee daily. History of GERD in past but no chronic issues currently. No PPI currently. No prior EGD. Colonoscopy in the remote past in [place]. Wife believes he had a small polyp. Unsure date or who performed. Would like to transition care to [place].   Wife states his mouth was full of blood prior to OG tube placement. Per ED notes, also had blood in mouth and concern for tongue biting.   Past Medical History:  Diagnosis Date  . GERD (gastroesophageal reflux disease)   . Hypertension   . Seizures ([place])   . Wears glasses     Past Surgical History:  Procedure Laterality Date  . COLONOSCOPY     remote past in [place]. ?Polyp. Wife unsure who performed or when  . SHOULDER ARTHROSCOPY WITH SUBACROMIAL DECOMPRESSION Right 05/06/2013    Procedure: Right shoulder arthroscopy rotator cuff tear repair, subacromial decompression;  Surgeon: Nita Sells, MD;  Location: [facility];  Service: Orthopedics;  Laterality: Right;  Right shoulder arthroscopy rotator cuff tear repair, subacromial decompression  . TONSILLECTOMY    . UMBILICAL HERNIA REPAIR  2007  . WISDOM TOOTH EXTRACTION      Prior to Admission medications   Medication Sig Start Date End Date Taking? Authorizing Provider  amLODipine (NORVASC) 10 MG tablet Take 10 mg by mouth daily. 03/11/17   [provider]  aspirin 81 MG chewable tablet Chew 1 tablet (81 mg total) by mouth daily. 03/20/15   Nita Sells, MD  Eslicarbazepine Acetate (APTIOM) 400 MG TABS Take 1 tablet by mouth 2 (two) times daily.    [provider]  hydrochlorothiazide (HYDRODIURIL) 25 MG tablet Take 1 tablet by mouth every morning. 03/02/17   [provider]  levETIRAcetam (KEPPRA) 500 MG tablet Take 1 tablet (500 mg total) by mouth 2 (two) times daily. Patient not taking: Reported on 03/28/2017 05/24/15   Kathie Dike, MD  levETIRAcetam (KEPPRA) 750 MG tablet Take 2 tablets by mouth 2 (two) times daily. 03/02/17   [provider]  LORazepam (ATIVAN) 1 MG tablet Take 1 every 6 hours if needed for minor seizures or shaking or anxiety 03/28/17   Milton Ferguson, MD  nicotine (NICODERM CQ - DOSED IN MG/24 HOURS) 14 mg/24hr patch Place 1 patch (14 mg total) onto the skin daily. Patient not taking: Reported on 05/22/2015 03/20/15  Nita Sells, MD  olmesartan (BENICAR) 20 MG tablet Take 20 mg by mouth daily. 03/01/17   [provider]  Omega-3 Fatty Acids (FISH OIL) 1000 MG CAPS Take 1 capsule by mouth daily.     [provider]  phenytoin (DILANTIN) 100 MG ER capsule Take 2 capsules (200 mg total) by mouth 2 (two) times daily. Patient not taking: Reported on 03/28/2017 05/24/15   Kathie Dike, MD  potassium chloride  (K-DUR) 10 MEQ tablet Take 1 tablet (10 mEq total) by mouth daily. 04/29/17   Milton Ferguson, MD  tadalafil (CIALIS) 5 MG tablet Take 5 mg by mouth daily as needed for erectile dysfunction.    [provider]    Current Facility-Administered Medications  Medication Dose Route Frequency Provider Last Rate Last Dose  . 0.9 %  sodium chloride infusion   Intravenous Continuous Reubin Milan, MD 125 mL/hr at 09/19/17 0309    . acetaminophen (TYLENOL) tablet 650 mg  650 mg Oral Q6H PRN Reubin Milan, MD       Or  . acetaminophen (TYLENOL) suppository 650 mg  650 mg Rectal Q6H PRN Reubin Milan, MD      . chlorhexidine gluconate (MEDLINE KIT) (PERIDEX) 0.12 % solution 15 mL  15 mL Mouth Rinse BID Reubin Milan, MD   15 mL at 09/19/17 0857  . fentaNYL (SUBLIMAZE) 2,500 mcg in sodium chloride 0.9 % 250 mL (10 mcg/mL) infusion  25-400 mcg/hr Intravenous Continuous Manuella Ghazi, Pratik D, DO 5 mL/hr at 09/19/17 1008 50 mcg/hr at 09/19/17 1008  . fentaNYL (SUBLIMAZE) bolus via infusion 50 mcg  50 mcg Intravenous Q1H PRN Sinda Du, MD      . fentaNYL (SUBLIMAZE) injection 100 mcg  100 mcg Intravenous Q2H PRN Sinda Du, MD   100 mcg at 09/19/17 0834  . ipratropium-albuterol (DUONEB) 0.5-2.5 (3) MG/3ML nebulizer solution 3 mL  3 mL Nebulization Q6H Sinda Du, MD   3 mL at 09/19/17 0824  . lactated ringers infusion   Intravenous Continuous Heath Lark D, DO 75 mL/hr at 09/19/17 3710    . levETIRAcetam (KEPPRA) IVPB 1000 mg/100 mL premix  1,000 mg Intravenous Q12H Reubin Milan, MD      . LORazepam (ATIVAN) injection 2 mg  2 mg Intravenous Q4H PRN Reubin Milan, MD   2 mg at 09/19/17 0357  . MEDLINE mouth rinse  15 mL Mouth Rinse 10 times per day Reubin Milan, MD   15 mL at 09/19/17 1002  . octreotide (SANDOSTATIN) 500 mcg in sodium chloride 0.9 % 250 mL (2 mcg/mL) infusion  50 mcg/hr Intravenous Continuous Reubin Milan, MD   Stopped at 09/19/17  [phone]  . ondansetron (ZOFRAN) tablet 4 mg  4 mg Oral Q6H PRN Reubin Milan, MD       Or  . ondansetron [facility]) injection 4 mg  4 mg Intravenous Q6H PRN Reubin Milan, MD      . pantoprazole (PROTONIX) 80 mg in sodium chloride 0.9 % 250 mL (0.32 mg/mL) infusion  8 mg/hr Intravenous Continuous Reubin Milan, MD 25 mL/hr at 09/19/17 0328 8 mg/hr at 09/19/17 0328  . propofol (DIPRIVAN) 1000 MG/100ML infusion  0-50 mcg/kg/min Intravenous Continuous Sinda Du, MD 34.8 mL/hr at 09/19/17 1038 50 mcg/kg/min at 09/19/17 1038    Allergies as of 09/19/2017 - Review Complete 09/19/2017  Allergen Reaction Noted  . Penicillins Itching 04/30/2013    Family History  Problem Relation Age of Onset  .  Hypertension Mother   . Breast cancer Sister   . Colon cancer Neg Hx   . Liver disease Neg Hx     Social History   Socioeconomic History  . Marital status: Married    Spouse name: Not on file  . Number of children: Not on file  . Years of education: Not on file  . Highest education level: Not on file  Occupational History  . Not on file  Social Needs  . Financial resource strain: Not on file  . Food insecurity:    Worry: Not on file    Inability: Not on file  . Transportation needs:    Medical: Not on file    Non-medical: Not on file  Tobacco Use  . Smoking status: Current Every Day Smoker    Packs/day: 0.25  . Smokeless tobacco: Current User  Substance and Sexual Activity  . 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 Drug use: Yes    Types: Marijuana    Comment: former  . Sexual activity: Not on file  Lifestyle  . Physical activity:    Days per week: Not on file    Minutes per session: Not on file  . Stress: Not on file  Relationships  . Social connections:    Talks on phone: Not on file    Gets together: Not on file    Attends religious service: Not on file    Active member of club or organization: Not on file     Attends meetings of clubs or organizations: Not on file    Relationship status: Not on file  . Intimate partner violence:    Fear of current or ex partner: Not on file    Emotionally abused: Not on file    Physically abused: Not on file    Forced sexual activity: Not on file  Other Topics Concern  . Not on file  Social History Narrative  . Not on file    Review of Systems: Unable to obtain due cognitive status   Physical Exam: Vital signs in last 24 hours: Temp:  [97.4 F (36.3 C)-98 F (36.7 C)] 98 F (36.7 C) (09/03 0816) Pulse Rate:  [69-120] 76 (09/03 0930) Resp:  [14-26] 16 (09/03 0930) BP: (91-135)/(55-99) 115/69 (09/03 0930) SpO2:  [93 %-100 %] 95 % (09/03 0930) FiO2 (%):  [45 %-100 %] 45 % (09/03 0833) Weight:  [283 kg-147.4 kg] 116.1 kg (09/03 0500)   General:  Resting without distress on vent.  Eyes:  Sclera clear, no icterus.  Ears:  Normal auditory acuity. Nose:  No deformity, discharge,  or lesions. Lungs:  Clear throughout to auscultation.  Heart:  S1 S2 present without murmurs. Abdomen:  Soft, nontender and nondistended. No masses, hepatosplenomegaly or hernias noted. Normal bowel sounds, without guarding, and without rebound. OG tube in place, small amount of dark brown, coffee grounds in suction container Rectal:  Deferred  Msk:  Symmetrical without gross deformities. Normal posture. Extremities:  Without edema. Neurologic: sedated on vent     Intake/Output from previous day: 09/02 0701 - 09/03 0700 In: 3609.1 [I.V.:522.7; IV Piggyback:3086.3] Out: 650 [Urine:650] Intake/Output this shift: No intake/output data recorded.  Lab Results: Recent Labs    09/19/17 0100 09/19/17 0252 09/19/17 0848  WBC 14.4*  --   --   HGB 15.4 14.3 13.3  HCT 48.3 43.5 41.3  PLT 400  --   --  BMET Recent Labs    09/19/17 0100 09/19/17 0720  NA 140 139  K 3.2* 4.4  CL 102 108  CO2 14* 24  GLUCOSE 234* 125*  BUN 12 13  CREATININE 1.34* 1.22  CALCIUM  9.2 8.2*   LFT Recent Labs    09/19/17 0100  PROT 8.8*  ALBUMIN 4.5  AST 47*  ALT 38  ALKPHOS 77  BILITOT 0.7   PT/INR Recent Labs    09/19/17 0100  LABPROT 14.6  INR 1.14    Studies/Results: Ct Head Wo Contrast  Result Date: 09/19/2017 CLINICAL DATA:  57 year old male with seizure and encephalopathy. EXAM: CT HEAD WITHOUT CONTRAST TECHNIQUE: Contiguous axial images were obtained from the base of the skull through the vertex without intravenous contrast. COMPARISON:  Head CT dated 10/13/2016 FINDINGS: Brain: The ventricles and sulci appropriate size for patient's age. A focal area of white matter hypodensity in the right frontal lobe appears similar to prior CT. Old right cerebellar infarct noted. There is no acute intracranial hemorrhage. No mass effect or midline shift. No extra-axial fluid collection. Vascular: No hyperdense vessel or unexpected calcification. Skull: Normal. Negative for fracture or focal lesion. Sinuses/Orbits: There is mild mucoperiosteal thickening of paranasal sinuses and opacification of the nasal passages. Left nasal tube noted. Other: Partially visualized enteric and endotracheal tubes. IMPRESSION: 1. No acute intracranial pathology. 2. Stable focal right frontal periventricular white matter hypodensity and old right cerebellar infarct. Electronically Signed   By: Anner Crete M.D.   On: 09/19/2017 02:02   Dg Chest 1v Repeat Same Day  Result Date: 09/19/2017 CLINICAL DATA:  Respiratory failure, endotracheal tube placement EXAM: CHEST - 1 VIEW SAME DAY COMPARISON:  Portable chest x-ray of 09/19/2016 FINDINGS: The tip of the endotracheal tube appears to be approximately 4.3 cm above the carina. No active infiltrate or effusion is seen. Mediastinal and hilar contours are unchanged and mild cardiomegaly is stable. IMPRESSION: Tip of endotracheal tube approximately 4.3 cm above the carina. No active lung disease. Electronically Signed   By: Ivar Drape M.D.   On:  09/19/2017 09:52   Dg Chest Portable 1 View  Result Date: 09/19/2017 CLINICAL DATA:  57 year old male with intubation. EXAM: PORTABLE CHEST 1 VIEW COMPARISON:  Chest radiograph dated 03/18/2015 FINDINGS: An endotracheal tube is noted with tip approximately 3.5 cm above the carina. An enteric tube extends into the left hemiabdomen with side-port in the region of the gastroesophageal junction and tip in the proximal stomach. Recommend further advancing of the tube into the stomach by an additional at least 5 cm. There is mild eventration of the right hemidiaphragm. There is no focal consolidation, pleural effusion, or pneumothorax. Mild cardiomegaly. No acute osseous pathology. IMPRESSION: 1. Endotracheal tube above the carina. Enteric tube with side port at the GE junction. Recommend further advancing of the tube into the stomach. 2. No acute cardiopulmonary process. 3. Mild cardiomegaly. Electronically Signed   By: Anner Crete M.D.   On: 09/19/2017 00:56    Impression: 57 year old male admitted with status epilepticus and requiring intubation for airway protection. After OG tube placement, reportedly had large amount of coffee ground gastric contents. Wife reports he drinks 2 tbsp of bourbon with coffee for past 10 years. No known history of advanced liver disease, no prior EGD, no evidence for thrombocytopenia or coagulopathy. Doubt dealing with underlying occult liver disease. Clinically, does not seem to be a significant upper GI bleed and may have experienced trauma from OG tube placement, and wife also  states his mouth was "full of blood" prior to OG tube placement. No melena noted, and his Hgb remains stable.  Will monitor clinically for now. He has no chronic concerning upper GI symptoms prior to admission. Agree with PPI. May stop octreotide. Will offer colonoscopy as an outpatient as a surveillance maneuver.   Plan: Monitor for overt GI bleeding Agree with PPI Discontinue  octreotide Colonoscopy as outpatient No EGD indicated unless evidence of melena, acute blood loss anemia, etc. Not a candidate for EGD currently.  Recommend ETOH cessation   Annitta Needs, PhD, ANP-BC [facility]     LOS: 0 days    09/19/2017, 11:15 AM

## 2017-09-19 NOTE — ED Notes (Signed)
Date and time results received: 09/19/17 0310   Test: I Stat Lactic Critical Value: 4.84  Name of Provider Notified: Rancour, MD

## 2017-09-19 NOTE — Progress Notes (Signed)
OG tube advanced 5 cm. Currently measuring 64 cm from lips.

## 2017-09-19 NOTE — ED Provider Notes (Signed)
[facility] EMERGENCY DEPARTMENT Provider Note   CSN: [id number] Arrival date & time: 09/19/17  0022     History   Chief Complaint Chief Complaint  Patient presents with  . Seizures    HPI Reginald [name] is a 57 y.o. male.  Level 5 caveat for altered mental status and seizures.  Patient presents via EMS after reportedly having seizure activity at home that was tonic-clonic.  EMS witnessed tonic-clonic seizure as well the last for about 30 seconds.  They gave him 2.5 mg of Versed.  He is now obtunded and sonorous.  He does have blood from his mouth and tongue biting.  He is not able to follow any commands.  He is diaphoretic.  Blood sugar was within normal limits.  No documented fever.  No known recent illnesses.  He apparently takes Keppra for his seizures.  The history is provided by the patient.  Seizures      Past Medical History:  Diagnosis Date  . Hypertension   . Seizures ([place])   . Wears glasses     Patient Active Problem List   Diagnosis Date Noted  . Alcohol abuse 05/24/2015  . Seizure ([place]) 05/22/2015  . Aspiration pneumonia ([place]) 03/17/2015  . 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 History of hypertension 03/17/2015  . Convulsions/seizures ([place]) 03/17/2015  . Tonic clonic seizures ([place]) 03/17/2015  . Hypokalemia     Past Surgical History:  Procedure Laterality Date  . COLONOSCOPY    . SHOULDER ARTHROSCOPY WITH SUBACROMIAL DECOMPRESSION Right 05/06/2013   Procedure: Right shoulder arthroscopy rotator cuff tear repair, subacromial decompression;  Surgeon: Nita Sells, MD;  Location: [place];  Service: Orthopedics;  Laterality: Right;  Right shoulder arthroscopy rotator cuff tear repair, subacromial decompression  . TONSILLECTOMY    . UMBILICAL HERNIA REPAIR  2007  . WISDOM TOOTH EXTRACTION          Home Medications    Prior to Admission medications   Medication Sig Start Date End Date Taking?  Authorizing Provider  amLODipine (NORVASC) 10 MG tablet Take 10 mg by mouth daily. 03/11/17   [provider]  aspirin 81 MG chewable tablet Chew 1 tablet (81 mg total) by mouth daily. 03/20/15   Nita Sells, MD  Eslicarbazepine Acetate (APTIOM) 400 MG TABS Take 1 tablet by mouth 2 (two) times daily.    [provider]  hydrochlorothiazide (HYDRODIURIL) 25 MG tablet Take 1 tablet by mouth every morning. 03/02/17   [provider]  levETIRAcetam (KEPPRA) 500 MG tablet Take 1 tablet (500 mg total) by mouth 2 (two) times daily. Patient not taking: Reported on 03/28/2017 05/24/15   Kathie Dike, MD  levETIRAcetam (KEPPRA) 750 MG tablet Take 2 tablets by mouth 2 (two) times daily. 03/02/17   [provider]  LORazepam (ATIVAN) 1 MG tablet Take 1 every 6 hours if needed for minor seizures or shaking or anxiety 03/28/17   Milton Ferguson, MD  nicotine (NICODERM CQ - DOSED IN MG/24 HOURS) 14 mg/24hr patch Place 1 patch (14 mg total) onto the skin daily. Patient not taking: Reported on 05/22/2015 03/20/15   Nita Sells, MD  olmesartan (BENICAR) 20 MG tablet Take 20 mg by mouth daily. 03/01/17   [provider]  Omega-3 Fatty Acids (FISH OIL) 1000 MG CAPS Take 1 capsule by mouth daily.     [provider]  phenytoin (DILANTIN) 100 MG ER capsule Take 2 capsules (200 mg total)  by mouth 2 (two) times daily. Patient not taking: Reported on 03/28/2017 05/24/15   Kathie Dike, MD  potassium chloride (K-DUR) 10 MEQ tablet Take 1 tablet (10 mEq total) by mouth daily. 04/29/17   Milton Ferguson, MD  tadalafil (CIALIS) 5 MG tablet Take 5 mg by mouth daily as needed for erectile dysfunction.    [provider]    Family History Family History  Problem Relation Age of Onset  . Hypertension Mother   . Breast cancer Sister     Social History Social History   Tobacco Use  . Smoking status: Current Every Day Smoker    Packs/day: 0.25  .  Smokeless tobacco: Current User  Substance Use Topics  . Alcohol use: Yes    Comment: occ  . Drug use: Yes    Types: Marijuana    Comment: former     Allergies   Penicillins   Review of Systems Review of Systems  Unable to perform ROS: Acuity of condition  Neurological: Positive for seizures.     Physical Exam Updated Vital Signs BP 112/77 (BP Location: Right Arm)   Pulse 71   Temp (!) 97.5 F (36.4 C) (Axillary)   Resp 16   Ht _0  (1.905 m)   Wt 116.1 kg   SpO2 95%   BMI 31.99 kg/m   Physical Exam  Constitutional: He appears well-developed and well-nourished. No distress.  Obese, obtunded, sonorous respirations  HENT:  Head: Normocephalic and atraumatic.  Mouth/Throat: Oropharynx is clear and moist. No oropharyngeal exudate.  Blood in mouth from tongue trauma  Eyes: Pupils are equal, round, and reactive to light. Conjunctivae and EOM are normal.  Dysconjugate gaze  Neck: Normal range of motion. Neck supple.  No meningismus.  Cardiovascular: Normal rate, regular rhythm, normal heart sounds and intact distal pulses.  No murmur heard. Pulmonary/Chest: Effort normal and breath sounds normal. No respiratory distress.  Abdominal: Soft. There is no tenderness. There is no rebound and no guarding.  Musculoskeletal: Normal range of motion. He exhibits no edema or tenderness.  Neurological: He is alert. No cranial nerve deficit. He exhibits normal muscle tone. Coordination normal.  Obtunded, intermittently combative, moves all extremities, does not follow commands  Skin: Skin is warm. Capillary refill takes less than 2 seconds. He is diaphoretic. There is pallor.  Psychiatric: He has a normal mood and affect. His behavior is normal.  Nursing note and vitals reviewed.    ED Treatments / Results  Labs (all labs ordered are listed, but only abnormal results are displayed) Labs Reviewed  CBC WITH DIFFERENTIAL/PLATELET - Abnormal; Notable for the following  components:      Result Value   WBC 14.4 (*)    RDW 15.6 (*)    Lymphs Abs 5.8 (*)    Monocytes Absolute 1.1 (*)    All other components within normal limits  COMPREHENSIVE METABOLIC PANEL - Abnormal; Notable for the following components:   Potassium 3.2 (*)    CO2 14 (*)    Glucose, Bld 234 (*)    Creatinine, Ser 1.34 (*)    Total Protein 8.8 (*)    AST 47 (*)    GFR calc non Af Amer 58 (*)    Anion gap 24 (*)    All other components within normal limits  URINALYSIS, ROUTINE W REFLEX MICROSCOPIC - Abnormal; Notable for the following components:   Hgb urine dipstick MODERATE (*)    Protein, ur 100 (*)    Bacteria, UA FEW (*)  All other components within normal limits  RAPID URINE DRUG SCREEN, HOSP PERFORMED - Abnormal; Notable for the following components:   Tetrahydrocannabinol POSITIVE (*)    All other components within normal limits  BLOOD GAS, ARTERIAL - Abnormal; Notable for the following components:   pH, Arterial 7.318 (*)    pO2, Arterial 128.0 (*)    All other components within normal limits  APTT - Abnormal; Notable for the following components:   aPTT 46 (*)    All other components within normal limits  LACTIC ACID, PLASMA - Abnormal; Notable for the following components:   Lactic Acid, Venous 4.3 (*)    All other components within normal limits  MAGNESIUM - Abnormal; Notable for the following components:   Magnesium 2.5 (*)    All other components within normal limits  BLOOD GAS, ARTERIAL - Abnormal; Notable for the following components:   pO2, Arterial 75.5 (*)    All other components within normal limits  TRIGLYCERIDES - Abnormal; Notable for the following components:   Triglycerides 242 (*)    All other components within normal limits  GLUCOSE, CAPILLARY - Abnormal; Notable for the following components:   Glucose-Capillary 112 (*)    All other components within normal limits  GLUCOSE, CAPILLARY - Abnormal; Notable for the following components:    Glucose-Capillary 131 (*)    All other components within normal limits  I-STAT CG4 LACTIC ACID, ED - Abnormal; Notable for the following components:   Lactic Acid, Venous >17.00 (*)    All other components within normal limits  CBG MONITORING, ED - Abnormal; Notable for the following components:   Glucose-Capillary 196 (*)    All other components within normal limits  I-STAT CG4 LACTIC ACID, ED - Abnormal; Notable for the following components:   Lactic Acid, Venous 4.84 (*)    All other components within normal limits  CULTURE, BLOOD (ROUTINE X 2)  CULTURE, BLOOD (ROUTINE X 2)  MRSA PCR SCREENING  URINE CULTURE  TROPONIN I  PROTIME-INR  HEMOGLOBIN AND HEMATOCRIT, BLOOD  PHOSPHORUS  BASIC METABOLIC PANEL  CBC  HEMOGLOBIN AND HEMATOCRIT, BLOOD  HEMOGLOBIN AND HEMATOCRIT, BLOOD  HEMOGLOBIN AND HEMATOCRIT, BLOOD  LACTIC ACID, PLASMA  POC OCCULT BLOOD, ED  TYPE AND SCREEN  TYPE AND SCREEN    EKG EKG Interpretation  Date/Time:  Tuesday September 19 2017 01:06:15 EDT Ventricular Rate:  113 PR Interval:    QRS Duration: 110 QT Interval:  360 QTC Calculation: 494 R Axis:   101 Text Interpretation:  Sinus or ectopic atrial tachycardia Probable right ventricular hypertrophy Probable lateral infarct, age indeterminate No significant change was found Confirmed by Ezequiel Essex [phone]) on 09/19/2017 2:41:03 AM   Radiology Ct Head Wo Contrast  Result Date: 09/19/2017 CLINICAL DATA:  57 year old male with seizure and encephalopathy. EXAM: CT HEAD WITHOUT CONTRAST TECHNIQUE: Contiguous axial images were obtained from the base of the skull through the vertex without intravenous contrast. COMPARISON:  Head CT dated 10/13/2016 FINDINGS: Brain: The ventricles and sulci appropriate size for patient's age. A focal area of white matter hypodensity in the right frontal lobe appears similar to prior CT. Old right cerebellar infarct noted. There is no acute intracranial hemorrhage. No mass effect  or midline shift. No extra-axial fluid collection. Vascular: No hyperdense vessel or unexpected calcification. Skull: Normal. Negative for fracture or focal lesion. Sinuses/Orbits: There is mild mucoperiosteal thickening of paranasal sinuses and opacification of the nasal passages. Left nasal tube noted. Other: Partially visualized enteric and endotracheal tubes.  IMPRESSION: 1. No acute intracranial pathology. 2. Stable focal right frontal periventricular white matter hypodensity and old right cerebellar infarct. Electronically Signed   By: Anner Crete M.D.   On: 09/19/2017 02:02   Dg Chest Portable 1 View  Result Date: 09/19/2017 CLINICAL DATA:  57 year old male with intubation. EXAM: PORTABLE CHEST 1 VIEW COMPARISON:  Chest radiograph dated 03/18/2015 FINDINGS: An endotracheal tube is noted with tip approximately 3.5 cm above the carina. An enteric tube extends into the left hemiabdomen with side-port in the region of the gastroesophageal junction and tip in the proximal stomach. Recommend further advancing of the tube into the stomach by an additional at least 5 cm. There is mild eventration of the right hemidiaphragm. There is no focal consolidation, pleural effusion, or pneumothorax. Mild cardiomegaly. No acute osseous pathology. IMPRESSION: 1. Endotracheal tube above the carina. Enteric tube with side port at the GE junction. Recommend further advancing of the tube into the stomach. 2. No acute cardiopulmonary process. 3. Mild cardiomegaly. Electronically Signed   By: Anner Crete M.D.   On: 09/19/2017 00:56    Procedures Procedure Name: Intubation Date/Time: 09/19/2017 12:49 AM Performed by: Ezequiel Essex, MD Pre-anesthesia Checklist: Patient identified, Patient being monitored, Emergency Drugs available, Timeout performed and Suction available Oxygen Delivery Method: Non-rebreather mask Preoxygenation: Pre-oxygenation with 100% oxygen Induction Type: Rapid sequence Ventilation: Mask  ventilation without difficulty and Nasal airway inserted- appropriate to patient size Laryngoscope Size: Mac and 4 Grade View: Grade II Tube size: 7.5 mm Number of attempts: 2 Airway Equipment and Method: Patient positioned with wedge pillow,  Video-laryngoscopy and Stylet Placement Confirmation: ETT inserted through vocal cords under direct vision,  CO2 detector and Breath sounds checked- equal and bilateral Secured at: 23 cm Tube secured with: ETT holder Dental Injury: Teeth and Oropharynx as per pre-operative assessment  Difficulty Due To: Difficulty was anticipated, Difficult Airway- due to large tongue and Difficult Airway- due to limited oral opening Future Recommendations: Recommend- induction with short-acting agent, and alternative techniques readily available      (including critical care time)  Medications Ordered in ED Medications  sodium chloride 0.9 % bolus 1,000 mL (has no administration in time range)  propofol (DIPRIVAN) 1000 MG/100ML infusion (has no administration in time range)  levETIRAcetam (KEPPRA) IVPB 1000 mg/100 mL premix (has no administration in time range)  propofol (DIPRIVAN) 1000 MG/100ML infusion (has no administration in time range)     Initial Impression / Assessment and Plan / ED Course  I have reviewed the triage vital signs and the nursing notes.  Pertinent labs & imaging results that were available during my care of the patient were reviewed by me and considered in my medical decision making (see chart for details).    Patient obtunded after multiple seizures at home.  He is intermittently combative.  Decision made to intubate for airway protection and to facilitate work-up.  Patient intubated as above for airway protection given his recurrent seizures is obtunded mental status and intermittent combativeness.  He is loaded with IV Keppra and placed on propofol for possible status epilepticus.  He is afebrile rectally.  Labs show elevated  lactate likely secondary to seizures rather than sepsis.  He is hyperglycemic without DKA. CT head is stable.  Chest x-ray shows no infiltrates.  Patient did have transient hypotension due to propofol sedation which was backed off.  Does have significant amount of coffee-ground emesis from NG tube.  Does have apparent history of alcohol abuse.  No EGDs in  the system.  Patient started on IV Protonix drip.  Continue IV fluids, IV propofol and Keppra. UDS positive for THC.  Admission to ICU discussed with Dr. Olevia Bowens.  CRITICAL CARE Performed by: Ezequiel Essex Total critical care time: 60 minutes Critical care time was exclusive of separately billable procedures and treating other patients. Critical care was necessary to treat or prevent imminent or life-threatening deterioration. Critical care was time spent personally by me on the following activities: development of treatment plan with patient and/or surrogate as well as nursing, discussions with consultants, evaluation of patient's response to treatment, examination of patient, obtaining history from patient or surrogate, ordering and performing treatments and interventions, ordering and review of laboratory studies, ordering and review of radiographic studies, pulse oximetry and re-evaluation of patient's condition.   Final Clinical Impressions(s) / ED Diagnoses   Final diagnoses:  Status epilepticus ([place])  Acute respiratory failure with hypoxia [facility])    ED Discharge Orders    None       Ezequiel Essex, MD 09/19/17 [phone]

## 2017-09-19 NOTE — Progress Notes (Signed)
Initial Nutrition Assessment  DOCUMENTATION CODES:  Obesity unspecified  INTERVENTION:  Patient on high dose propofol. Will defer making tentative nutrition support recommendations at this time as diprivan rate very apt to change. Will follow.   Would recommend TF initiation if unable to be weaned in next 36 hrs.  NUTRITION DIAGNOSIS:  Inadequate oral intake related to inability to eat as evidenced by NPO status.  GOAL:  Patient will meet greater than or equal to 90% of their needs  MONITOR:  Vent status  REASON FOR ASSESSMENT:  Ventilator    ASSESSMENT:  57 y/o male PMHx HTN, Seizure disorder, ETOH abuse. Presented via EMS w/ status epilepticus. Intubated for airway protection. Had acute UGI after NGT inserted. GI/Pulm/neuro consulted.   Patient intubated, sedated on high dose propofol. Unable to obtain any history.   Per chart, wife had noted to GI NP that the patients appetite had been down recently. Modest daily eoth intake.   Clinically, though patient intubated, do not believe he meets criteria for being critically ill. Will use standard Kcal/kg dosing as opposed to ASPEN/SCCM guidelines for critical ill, obese.   Physical Exam: Abdomen soft, non distended. No wasting.   Patient is currently intubated on ventilator support MV: 10.1 L/min Temp (24hrs), Avg:97.6 F (36.4 C), Min:97.4 F (36.3 C), Max:98 F (36.7 C) Propofol: 27.6 ml/hr =  729 kcals/d  Labs: TG:242->202, BG:110-140,  Meds: IVF, PPI, KCL, Fentanyl, Ativan  Recent Labs  Lab 09/19/17 0100 09/19/17 0252 09/19/17 0720  NA 140  --  139  K 3.2*  --  4.4  CL 102  --  108  CO2 14*  --  24  BUN 12  --  13  CREATININE 1.34*  --  1.22  CALCIUM 9.2  --  8.2*  MG  --  2.5*  --   PHOS  --  2.6  --   GLUCOSE 234*  --  125*   NUTRITION - FOCUSED PHYSICAL EXAM:   Most Recent Value  Orbital Region  No depletion  Upper Arm Region  No depletion  Thoracic and Lumbar Region  No depletion  Buccal Region   No depletion  Temple Region  No depletion  Clavicle Bone Region  No depletion  Clavicle and Acromion Bone Region  No depletion  Scapular Bone Region  Unable to assess  Dorsal Hand  No depletion  Patellar Region  No depletion  Anterior Thigh Region  No depletion  Posterior Calf Region  No depletion  Edema (RD Assessment)  None  Hair  Reviewed  Eyes  Reviewed  Mouth  Reviewed  Skin  Reviewed  Nails  Reviewed     Diet Order:   Diet Order            Diet NPO time specified  Diet effective now             EDUCATION NEEDS:  No education needs have been identified at this time  Skin:  Skin Assessment: Reviewed RN Assessment  Last BM:  Unknown  Height:  Ht Readings from Last 1 Encounters:  09/19/17 6\' 2"  (1.88 m)   Weight:  Wt Readings from Last 1 Encounters:  09/19/17 116.1 kg   Wt Readings from Last 10 Encounters:  09/19/17 116.1 kg  04/29/17 115.2 kg  03/28/17 115.2 kg  05/24/15 114.3 kg  03/20/15 105 kg  03/17/15 102.1 kg  05/06/13 102.5 kg   Ideal Body Weight:  86.36 kg  BMI:  Body mass index is 32.86  kg/m.  Estimated Nutritional Needs:  Kcal:  2200-2350 kcals (19-20 kcal/kg bw) Protein:  120-138g Pro (1.4-1.6 g/kg ibw) Fluid:  >2.2 L (1 ml/kcal)  Christophe Louis RD, LDN, CNSC Clinical Nutrition Available Tues-Sat via Pager: [phone] 09/19/2017 3:36 PM

## 2017-09-19 NOTE — Procedures (Addendum)
  [place] A. [name], MD     [email or web address]           HISTORY: 57 YO who presents with seizures.   MEDICATIONS: Scheduled Meds: . chlorhexidine gluconate (MEDLINE KIT)  15 mL Mouth Rinse BID  . ipratropium-albuterol  3 mL Nebulization Q6H  . mouth rinse  15 mL Mouth Rinse 10 times per day   Continuous Infusions: . sodium chloride 125 mL/hr at 09/19/17 0309  . fentaNYL 10 mcg/ml infusion 50 mcg/hr (09/19/17 1008)  . lactated ringers 75 mL/hr at 09/19/17 0958  . levETIRAcetam Stopped (09/19/17 1335)  . pantoprozole (PROTONIX) infusion 8 mg/hr (09/19/17 1236)  . propofol (DIPRIVAN) infusion 40 mcg/kg/min (09/19/17 1551)   PRN Meds:.acetaminophen **OR** acetaminophen, fentaNYL, fentaNYL (SUBLIMAZE) injection, LORazepam, ondansetron **OR** ondansetron (ZOFRAN) IV  Prior to Admission medications   Medication Sig Start Date End Date Taking? Authorizing Provider  Eslicarbazepine Acetate (APTIOM) 400 MG TABS Take 1 tablet by mouth 2 (two) times daily.   Yes [provider]  levETIRAcetam (KEPPRA) 750 MG tablet Take 1,500 mg by mouth 2 (two) times daily.  03/02/17  Yes [provider]  amLODipine (NORVASC) 10 MG tablet Take 10 mg by mouth daily. 03/11/17   [provider]  aspirin 81 MG chewable tablet Chew 1 tablet (81 mg total) by mouth daily. 03/20/15   Nita Sells, MD  hydrochlorothiazide (HYDRODIURIL) 25 MG tablet Take 1 tablet by mouth every morning. 03/02/17   [provider]  LORazepam (ATIVAN) 1 MG tablet Take 1 every 6 hours if needed for minor seizures or shaking or anxiety 03/28/17   Milton Ferguson, MD  olmesartan (BENICAR) 20 MG tablet Take 20 mg by mouth daily. 03/01/17   [provider]  Omega-3 Fatty Acids (FISH OIL) 1000 MG CAPS Take 1 capsule by mouth daily.     [provider]  phenytoin (DILANTIN) 100 MG ER capsule Take 2 capsules (200 mg total) by mouth 2 (two) times daily. Patient not  taking: Reported on 03/28/2017 05/24/15   Kathie Dike, MD  potassium chloride (K-DUR) 10 MEQ tablet Take 1 tablet (10 mEq total) by mouth daily. 04/29/17   Milton Ferguson, MD  tadalafil (CIALIS) 5 MG tablet Take 5 mg by mouth daily as needed for erectile dysfunction.    [provider]      ANALYSIS: A 16 channel recording using standard 10 20 measurements is conducted for 22 minutes. There is a dominant posterior rhythm of 12 Hz which attenuates with eye opening. There is beta activity seen over the frontal fields. Awake and sleep activities are seen. Stage two NREM sleep with K complexes and spindles is seen. Photic stimulation and hyperventilation were not done. There is no lateralized slowing or epileptiform activity. There is occasional theta focal slowing T3 and T5.    IMPRESSION: 1. Occasional left temporal slowing is noted. This can be associated with focal cerebral disturbance or focal epileptic focus.        Daylen Hack A. [name], M.D.  Diplomate, Tax adviser of Psychiatry and Neurology ( Neurology).

## 2017-09-19 NOTE — Progress Notes (Signed)
Bedside EEG completed, results pending. Notified Dr. Gerilyn Pilgrim.

## 2017-09-19 NOTE — ED Triage Notes (Signed)
Pt brought in by [facility] for c/o seizures; pt had two witnessed seizures at home by family; when ems arrived pt was found to be post-ictal; pt had witnessed seizure en route by ems; ems administered versed 2mg  iv; pt unresponsive and very diaphoretic

## 2017-09-19 NOTE — Progress Notes (Signed)
Patient transported to ICU on vent successfully. No adverse events noted.

## 2017-09-19 NOTE — Consult Note (Signed)
[place] A. [name], MD     [email or web address]          Reginald [name] is an 57 y.o. male.   ASSESSMENT/PLAN: 1.  Resolved status epilepticus: I suspect this is most likely due to the abrupt cessation of antiseizure medications.  The patient actually has been restarted on Keppra although the dose is lower than he previously was on.  The dose will be increased back to his original dose.  I will go ahead and start the patient on Depakote.  Patient could be able to wean off ventilator from neurological standpoint.  2.  Multifactorial encephalopathy including status epilepticus and postictal encephalopathy along with medication effect. 3.  Alcoholism: It is possible that some of the seizures could be due to alcohol withdrawal but given his focal EEG findings and MRI, he will be maintained on antiseizure medications. 4.  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    The patient is a 57 year old black male who has a known history of epilepsy and remote infarcts.  He has been maintained on 2 seizure medications.  The patient lost his medical insurance and has had difficulties taking 1 of his seizure medications.  The patient however was to be on Keppra which he had no difficulties obtaining.  It appears that he has not been compliant with the [place] recently and apparently been taking marijuana to help with seizure control.  He apparently stopped using marijuana but to 3 days ago and presented to the emergency room yesterday with multiple seizures.  He was intubated for airway protection.  There apparently also has a history unknown to me of alcoholism.  He apparently consumes alcohol daily including alcohol with his coffee in the morning.  Patient is currently still intubated.  EEG was done earlier today and was reviewed.  The patient has been quite agitated even on max dose of propofol and fentanyl.  He was given additional sedation before my  evaluation today.  ROS -unable to given the encephalopathy and unresponsiveness.    GENERAL: He is intubated and sedated.  HEENT: No obvious trauma appreciated and neck is supple.  ABDOMEN: soft  EXTREMITIES: No edema   BACK: Normal  SKIN: Normal by inspection.    MENTAL STATUS: No eye opening is appreciated even with the painful stimuli.  CRANIAL NERVES: Pupils are equal, round and reactive to light; extra ocular movements are fine with oculocephalic reflexes,  upper and lower facial muscles are normal in strength and symmetric, there is no flattening of the nasolabial folds  MOTOR: Minimal pain to deep painful stimuli but again the patient is heavily sedated.  COORDINATION: No clear obvious tremors or dysmetria.       PRIOR NEURO NOTES 1. Resolved status epilepticus. 2. Recurrent seizures etiology unclear. Given the patient's history of alcoholism, I suspect that he could have I have alcohol withdrawal seizure but this is not entirely clear at this time. 3. 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 4. Alcoholism       RECOMMENDATION:   Coated 81 mg aspirin 2 daily. Seizure precautions and no driving for now. Follow-up in 6-8 weeks.    MRI BRAIN 1. Asymmetric right-sided T2 hyperintensities in the anterior right frontal lobe and right cerebellum. These most likely related to remote ischemia. Asymmetric demyelinating disease is considered less likely. Given the patient's recent seizures, MRI with contrast would be useful in the evaluation of possible underlying neoplasm. 2. No acute intracranial abnormality.  3. Mild sinus disease.     The patient's brain MRI is reviewed in person. There is no acute lesions seen. There is increased signal involving the right frontal region in a wedge-shaped wonders distribution but mostly limited to the deep white matter and not involving the cortical region. This is seen on FLAIR imaging and I do not see corresponding  reduced signal on T1. There is a similar and somewhat larger lesion involving the right inferior cerebellum region associated with increased signal on FLAIR imaging and reduced signal/encephalomalacia involving the T1 sequence. No hemorrhage appreciated. There is mild periventricular white matter disease that seems appropriate for age.      IMPRESSION: This recording of the awake and sleep states is essentially unremarkable. There is no epileptiform activity is observed.    Blood pressure 94/62, pulse 66, temperature 98 F (36.7 C), temperature source Axillary, resp. rate 16, height '6\' 2"'$  (1.88 m), weight 116.1 kg, SpO2 96 %.  Past Medical History:  Diagnosis Date  . GERD (gastroesophageal reflux disease)   . Hypertension   . Seizures ([place])   . Wears glasses     Past Surgical History:  Procedure Laterality Date  . COLONOSCOPY     remote past in [place]. ?Polyp. Wife unsure who performed or when  . SHOULDER ARTHROSCOPY WITH SUBACROMIAL DECOMPRESSION Right 05/06/2013   Procedure: Right shoulder arthroscopy rotator cuff tear repair, subacromial decompression;  Surgeon: Nita Sells, MD;  Location: [facility];  Service: Orthopedics;  Laterality: Right;  Right shoulder arthroscopy rotator cuff tear repair, subacromial decompression  . TONSILLECTOMY    . UMBILICAL HERNIA REPAIR  2007  . WISDOM TOOTH EXTRACTION      Family History  Problem Relation Age of Onset  . Hypertension Mother   . Breast cancer Sister   . Colon cancer Neg Hx   . Liver disease Neg Hx     Social History:  reports that he has been smoking. He has been smoking about 0.25 packs per day. He uses smokeless tobacco. He reports that he drinks alcohol. He reports that he has current or past drug history. Drug: Marijuana.  Allergies:  PCN   Medications: Prior to Admission medications   Medication Sig Start Date End Date Taking? Authorizing Provider  Eslicarbazepine Acetate (APTIOM)  400 MG TABS Take 1 tablet by mouth 2 (two) times daily.   Yes [provider]  levETIRAcetam (KEPPRA) 750 MG tablet Take 1,500 mg by mouth 2 (two) times daily.  03/02/17  Yes [provider]  amLODipine (NORVASC) 10 MG tablet Take 10 mg by mouth daily. 03/11/17   [provider]  aspirin 81 MG chewable tablet Chew 1 tablet (81 mg total) by mouth daily. 03/20/15   Nita Sells, MD  hydrochlorothiazide (HYDRODIURIL) 25 MG tablet Take 1 tablet by mouth every morning. 03/02/17   [provider]  LORazepam (ATIVAN) 1 MG tablet Take 1 every 6 hours if needed for minor seizures or shaking or anxiety 03/28/17   Milton Ferguson, MD  olmesartan (BENICAR) 20 MG tablet Take 20 mg by mouth daily. 03/01/17   [provider]  Omega-3 Fatty Acids (FISH OIL) 1000 MG CAPS Take 1 capsule by mouth daily.     [provider]  phenytoin (DILANTIN) 100 MG ER capsule Take 2 capsules (200 mg total) by mouth 2 (two) times daily. Patient not taking: Reported on 03/28/2017 05/24/15   Kathie Dike, MD  potassium chloride (K-DUR) 10 MEQ tablet Take 1 tablet (10  mEq total) by mouth daily. 04/29/17   Milton Ferguson, MD  tadalafil (CIALIS) 5 MG tablet Take 5 mg by mouth daily as needed for erectile dysfunction.    [provider]    Scheduled Meds: . chlorhexidine gluconate (MEDLINE KIT)  15 mL Mouth Rinse BID  . ipratropium-albuterol  3 mL Nebulization Q6H  . mouth rinse  15 mL Mouth Rinse 10 times per day   Continuous Infusions: . sodium chloride 125 mL/hr at 09/19/17 0309  . fentaNYL 10 mcg/ml infusion 50 mcg/hr (09/19/17 1008)  . lactated ringers 75 mL/hr at 09/19/17 0958  . levETIRAcetam Stopped (09/19/17 1335)  . pantoprozole (PROTONIX) infusion 8 mg/hr (09/19/17 1236)  . propofol (DIPRIVAN) infusion 40 mcg/kg/min (09/19/17 1551)   PRN Meds:.acetaminophen **OR** acetaminophen, fentaNYL, fentaNYL (SUBLIMAZE) injection, LORazepam, ondansetron **OR**  ondansetron (ZOFRAN) IV     Results for orders placed or performed during the hospital encounter of 09/19/17 (from the past 48 hour(s))  CBG monitoring, ED     Status: Abnormal   Collection Time: 09/19/17 12:36 AM  Result Value Ref Range   Glucose-Capillary 196 (H) 70 - 99 mg/dL   Comment 1 Notify RN   CBC with Differential/Platelet     Status: Abnormal   Collection Time: 09/19/17  1:00 AM  Result Value Ref Range   WBC 14.4 (H) 4.0 - 10.5 K/uL   RBC 5.78 4.22 - 5.81 MIL/uL   Hemoglobin 15.4 13.0 - 17.0 g/dL   HCT 48.3 39.0 - 52.0 %   MCV 83.6 78.0 - 100.0 fL   MCH 26.6 26.0 - 34.0 pg   MCHC 31.9 30.0 - 36.0 g/dL   RDW 15.6 (H) 11.5 - 15.5 %   Platelets 400 150 - 400 K/uL   Neutrophils Relative % 51 %   Neutro Abs 7.3 1.7 - 7.7 K/uL   Lymphocytes Relative 40 %   Lymphs Abs 5.8 (H) 0.7 - 4.0 K/uL   Monocytes Relative 8 %   Monocytes Absolute 1.1 (H) 0.1 - 1.0 K/uL   Eosinophils Relative 1 %   Eosinophils Absolute 0.1 0.0 - 0.7 K/uL   Basophils Relative 0 %   Basophils Absolute 0.1 0.0 - 0.1 K/uL    Comment: Performed at [facility], [address], [place],  [place] [address]  Comprehensive metabolic panel     Status: Abnormal   Collection Time: 09/19/17  1:00 AM  Result Value Ref Range   Sodium 140 135 - 145 mmol/L   Potassium 3.2 (L) 3.5 - 5.1 mmol/L   Chloride 102 98 - 111 mmol/L   CO2 14 (L) 22 - 32 mmol/L   Glucose, Bld 234 (H) 70 - 99 mg/dL   BUN 12 6 - 20 mg/dL   Creatinine, Ser 1.34 (H) 0.61 - 1.24 mg/dL   Calcium 9.2 8.9 - 10.3 mg/dL   Total Protein 8.8 (H) 6.5 - 8.1 g/dL   Albumin 4.5 3.5 - 5.0 g/dL   AST 47 (H) 15 - 41 U/L   ALT 38 0 - 44 U/L   Alkaline Phosphatase 77 38 - 126 U/L   Total Bilirubin 0.7 0.3 - 1.2 mg/dL   GFR calc non Af Amer 58 (L) >60 mL/min   GFR calc Af Amer >60 >60 mL/min    Comment: (NOTE) The eGFR has been calculated using the CKD EPI equation. This calculation has not been validated in all clinical situations. eGFR's persistently  <60 mL/min signify possible Chronic Kidney Disease.    Anion gap 24 (H)  5 - 15    Comment: Performed at [facility], [address], [place], [place] [address]  Troponin I     Status: None   Collection Time: 09/19/17  1:00 AM  Result Value Ref Range   Troponin I <0.03 <0.03 ng/mL    Comment: Performed at [facility], [address] [place], [place] [address]  Protime-INR     Status: None   Collection Time: 09/19/17  1:00 AM  Result Value Ref Range   Prothrombin Time 14.6 11.4 - 15.2 seconds   INR 1.14     Comment: Performed at [facility], [address] [place], [place] [address]  APTT     Status: Abnormal   Collection Time: 09/19/17  1:00 AM  Result Value Ref Range   aPTT 46 (H) 24 - 36 seconds    Comment:        IF BASELINE aPTT IS ELEVATED, SUGGEST PATIENT RISK ASSESSMENT BE USED TO DETERMINE APPROPRIATE ANTICOAGULANT THERAPY. Performed at [facility], [address], [place], [place] [address]   I-Stat CG4 Lactic Acid, ED     Status: Abnormal   Collection Time: 09/19/17  1:01 AM  Result Value Ref Range   Lactic Acid, Venous >17.00 (HH) 0.5 - 1.9 mmol/L  Urinalysis, Routine w reflex microscopic     Status: Abnormal   Collection Time: 09/19/17  1:20 AM  Result Value Ref Range   Color, Urine YELLOW YELLOW   APPearance CLEAR CLEAR   Specific Gravity, Urine 1.014 1.005 - 1.030   pH 5.0 5.0 - 8.0   Glucose, UA NEGATIVE NEGATIVE mg/dL   Hgb urine dipstick MODERATE (A) NEGATIVE   Bilirubin Urine NEGATIVE NEGATIVE   Ketones, ur NEGATIVE NEGATIVE mg/dL   Protein, ur 100 (A) NEGATIVE mg/dL   Nitrite NEGATIVE NEGATIVE   Leukocytes, UA NEGATIVE NEGATIVE   RBC / HPF 6-10 0 - 5 RBC/hpf   WBC, UA 0-5 0 - 5 WBC/hpf   Bacteria, UA FEW (A) NONE SEEN   Mucus PRESENT    Sperm, UA PRESENT     Comment: Performed at [facility], [address] [place], [place] [address]  Rapid urine drug screen (hospital performed)     Status: Abnormal   Collection Time: 09/19/17  1:20 AM  Result  Value Ref Range   Opiates NONE DETECTED NONE DETECTED   Cocaine NONE DETECTED NONE DETECTED   Benzodiazepines NONE DETECTED NONE DETECTED   Amphetamines NONE DETECTED NONE DETECTED   Tetrahydrocannabinol POSITIVE (A) NONE DETECTED   Barbiturates NONE DETECTED NONE DETECTED    Comment: (NOTE) DRUG SCREEN FOR MEDICAL PURPOSES ONLY.  IF CONFIRMATION IS NEEDED FOR ANY PURPOSE, NOTIFY LAB WITHIN 5 DAYS. LOWEST DETECTABLE LIMITS FOR URINE DRUG SCREEN Drug Class                     Cutoff (ng/mL) Amphetamine and metabolites    1000 Barbiturate and metabolites    200 Benzodiazepine                 177 Tricyclics and metabolites     300 Opiates and metabolites        300 Cocaine and metabolites        300 THC                            50 Performed at [facility], [address], [place], [place] [address]   Blood culture (routine  x 2)     Status: None (Preliminary result)   Collection Time: 09/19/17  2:04 AM  Result Value Ref Range   Specimen Description BLOOD RIGHT HAND    Special Requests      BOTTLES DRAWN AEROBIC AND ANAEROBIC Blood Culture adequate volume   Culture      NO GROWTH < 12 HOURS Performed at [facility], [address] [place], [place] [address]    Report Status PENDING   Blood culture (routine x 2)     Status: None (Preliminary result)   Collection Time: 09/19/17  2:06 AM  Result Value Ref Range   Specimen Description BLOOD LEFT HAND    Special Requests      BOTTLES DRAWN AEROBIC ONLY Blood Culture adequate volume   Culture      NO GROWTH < 12 HOURS Performed at [facility], [address], [place], [place] [address]    Report Status PENDING   Blood gas, arterial     Status: Abnormal   Collection Time: 09/19/17  2:10 AM  Result Value Ref Range   FIO2 100.00    Delivery systems VENTILATOR    Mode PRESSURE REGULATED VOLUME CONTROL    VT 600 mL   LHR 18 resp/min   Peep/cpap 5.0 cm H20   pH, Arterial 7.318 (L) 7.350 - 7.450   pCO2 arterial 43.1 32.0 -  48.0 mmHg   pO2, Arterial 128.0 (H) 83.0 - 108.0 mmHg   Bicarbonate 21.1 20.0 - 28.0 mmol/L   O2 Saturation 97.8 %   Collection site RIGHT RADIAL    Drawn by [id number]    Sample type ARTERIAL    Allens test (pass/fail) PASS PASS    Comment: Performed at [facility], [address] [place], [place] [address]  Type and screen [facility]     Status: None   Collection Time: 09/19/17  2:52 AM  Result Value Ref Range   ABO/RH(D) A POS    Antibody Screen NEG    Sample Expiration      09/22/2017 Performed at [facility], [address] [place], [place] [address]   Hemoglobin and hematocrit, blood     Status: None   Collection Time: 09/19/17  2:52 AM  Result Value Ref Range   Hemoglobin 14.3 13.0 - 17.0 g/dL   HCT 43.5 39.0 - 52.0 %    Comment: Performed at [facility], [address] [place], [place] [address]  Lactic acid, plasma     Status: Abnormal   Collection Time: 09/19/17  2:52 AM  Result Value Ref Range   Lactic Acid, Venous 4.3 (HH) 0.5 - 1.9 mmol/L    Comment: CRITICAL RESULT CALLED TO, READ BACK BY AND VERIFIED WITH: HEARN,J. AT 0355 ON 09/19/2017 BY EVA Performed at [facility], [address], [place], [place] [address]   Magnesium     Status: Abnormal   Collection Time: 09/19/17  2:52 AM  Result Value Ref Range   Magnesium 2.5 (H) 1.7 - 2.4 mg/dL    Comment: Performed at [facility] [address], [place], [place] [address]  Phosphorus     Status: None   Collection Time: 09/19/17  2:52 AM  Result Value Ref Range   Phosphorus 2.6 2.5 - 4.6 mg/dL    Comment: Performed at [facility], [address] [place], [place] [address]  I-Stat CG4 Lactic Acid, ED     Status: Abnormal   Collection Time: 09/19/17  2:56 AM  Result Value Ref  Range   Lactic Acid, Venous 4.84 (HH) 0.5 - 1.9 mmol/L   Comment NOTIFIED PHYSICIAN   MRSA PCR Screening     Status: None   Collection Time: 09/19/17  3:50 AM  Result Value Ref Range   MRSA by PCR NEGATIVE NEGATIVE    Comment:         The GeneXpert MRSA Assay (FDA approved for NASAL specimens only), is one component of a comprehensive MRSA colonization surveillance program. It is not intended to diagnose MRSA infection nor to guide or monitor treatment for MRSA infections. Performed at [facility], [address] [place], [place] [address]   Blood gas, arterial     Status: Abnormal   Collection Time: 09/19/17  4:55 AM  Result Value Ref Range   FIO2 45.00    Delivery systems VENTILATOR    Mode PRESSURE REGULATED VOLUME CONTROL    VT 650 mL   LHR 16 resp/min   Peep/cpap 5.0 cm H20   pH, Arterial 7.371 7.350 - 7.450   pCO2 arterial 42.7 32.0 - 48.0 mmHg   pO2, Arterial 75.5 (L) 83.0 - 108.0 mmHg   Bicarbonate 23.8 20.0 - 28.0 mmol/L   Acid-base deficit 0.4 0.0 - 2.0 mmol/L   O2 Saturation 93.2 %   Collection site RIGHT RADIAL    Drawn by [id number]    Sample type ARTERIAL    Allens test (pass/fail) PASS PASS    Comment: Performed at [facility], [address] [place], [place] [address]  Triglycerides     Status: Abnormal   Collection Time: 09/19/17  5:24 AM  Result Value Ref Range   Triglycerides 242 (H) <150 mg/dL    Comment: Performed at [facility], [address], [place], [place] [address]  Glucose, capillary     Status: Abnormal   Collection Time: 09/19/17  6:12 AM  Result Value Ref Range   Glucose-Capillary 112 (H) 70 - 99 mg/dL  Basic metabolic panel     Status: Abnormal   Collection Time: 09/19/17  7:20 AM  Result Value Ref Range   Sodium 139 135 - 145 mmol/L   Potassium 4.4 3.5 - 5.1 mmol/L    Comment: DELTA CHECK NOTED   Chloride 108 98 - 111 mmol/L   CO2 24 22 - 32 mmol/L   Glucose, Bld 125 (H) 70 - 99 mg/dL   BUN 13 6 - 20 mg/dL   Creatinine, Ser 1.22 0.61 - 1.24 mg/dL   Calcium 8.2 (L) 8.9 - 10.3 mg/dL   GFR calc non Af Amer >60 >60 mL/min   GFR calc Af Amer >60 >60 mL/min    Comment: (NOTE) The eGFR has been calculated using the CKD EPI equation. This calculation has not been  validated in all clinical situations. eGFR's persistently <60 mL/min signify possible Chronic Kidney Disease.    Anion gap 7 5 - 15    Comment: Performed at [facility], [address] [place], [place] [address]  Type and screen [facility]     Status: None   Collection Time: 09/19/17  7:20 AM  Result Value Ref Range   ABO/RH(D) A POS    Antibody Screen NEG    Sample Expiration      09/22/2017 Performed at [facility], [address], [place], [place] [address]   Lactic acid, plasma     Status: None   Collection Time: 09/19/17  7:20 AM  Result Value Ref Range   Lactic Acid, Venous 1.3 0.5 - 1.9  mmol/L    Comment: Performed at [facility], [address], [place], [place] [address]  Glucose, capillary     Status: Abnormal   Collection Time: 09/19/17  7:39 AM  Result Value Ref Range   Glucose-Capillary 131 (H) 70 - 99 mg/dL  Hemoglobin and hematocrit, blood     Status: None   Collection Time: 09/19/17  8:48 AM  Result Value Ref Range   Hemoglobin 13.3 13.0 - 17.0 g/dL   HCT 41.3 39.0 - 52.0 %    Comment: Performed at [facility], [address] [place], [place] [address]  Triglycerides     Status: Abnormal   Collection Time: 09/19/17  8:48 AM  Result Value Ref Range   Triglycerides 202 (H) <150 mg/dL    Comment: Performed at [facility], [address], [place], [place] [address]  Glucose, capillary     Status: Abnormal   Collection Time: 09/19/17 12:34 PM  Result Value Ref Range   Glucose-Capillary 137 (H) 70 - 99 mg/dL  Hemoglobin and hematocrit, blood     Status: Abnormal   Collection Time: 09/19/17  2:48 PM  Result Value Ref Range   Hemoglobin 12.5 (L) 13.0 - 17.0 g/dL   HCT 38.6 (L) 39.0 - 52.0 %    Comment: Performed at [facility], [address] [place],  [address]    Studies/Results:   Head CT FINDINGS: Brain: The ventricles and sulci appropriate size for patient's age. A focal area of white matter hypodensity in the right frontal  lobe appears similar to prior CT. Old right cerebellar infarct noted. There is no acute intracranial hemorrhage. No mass effect or midline shift. No extra-axial fluid collection.  Vascular: No hyperdense vessel or unexpected calcification.  Skull: Normal. Negative for fracture or focal lesion.  Sinuses/Orbits: There is mild mucoperiosteal thickening of paranasal sinuses and opacification of the nasal passages. Left nasal tube noted.  Other: Partially visualized enteric and endotracheal tubes.  IMPRESSION: 1. No acute intracranial pathology. 2. Stable focal right frontal periventricular white matter hypodensity and old right cerebellar infarct.         Guadalupe Kerekes A. [name], M.D.  Diplomate, Tax adviser of Psychiatry and Neurology ( Neurology). 09/19/2017, 4:45 PM

## 2017-09-19 NOTE — ED Notes (Signed)
Pt intubated at 0028 with 7.5 ET tube 23cm at the lip; pt's wife came in to see pt and informed staff she was going home to get clothes and would be back

## 2017-09-19 NOTE — Consult Note (Signed)
Consult requested by: [facility], Dr. Felisa Bonier Consult requested for: Respiratory failure  HPI: This is a 57 year old who came to the emergency department early this morning by EMS with status epilepticus.  He was intubated in the emergency department for airway protection.  History and information is from the medical record as he is intubated and there is no family available.  Apparently he uses cannabis for his seizures but he has not used that in the past day or so which may have precipitated his status epilepticus.  He had a nasogastric tube inserted and it drained a large amount of blood.  He is been started on Keppra Protonix propofol and remains on mechanical ventilation.  He is occasionally swinging at staff when he is stimulated.  Past Medical History:  Diagnosis Date  . Hypertension   . Seizures ([place])   . Wears glasses      Family History  Problem Relation Age of Onset  . Hypertension Mother   . Breast cancer Sister      Social History   Socioeconomic History  . Marital status: Married    Spouse name: Not on file  . Number of children: Not on file  . Years of education: Not on file  . Highest education level: Not on file  Occupational History  . Not on file  Social Needs  . Financial resource strain: Not on file  . Food insecurity:    Worry: Not on file    Inability: Not on file  . Transportation needs:    Medical: Not on file    Non-medical: Not on file  Tobacco Use  . Smoking status: Current Every Day Smoker    Packs/day: 0.25  . Smokeless tobacco: Current User  Substance and Sexual Activity  . Alcohol use: Yes    Comment: occ  . Drug use: Yes    Types: Marijuana    Comment: former  . Sexual activity: Not on file  Lifestyle  . Physical activity:    Days per week: Not on file    Minutes per session: Not on file  . Stress: Not on file  Relationships  . Social connections:    Talks on phone: Not on file    Gets together: Not on file     Attends religious service: Not on file    Active member of club or organization: Not on file    Attends meetings of clubs or organizations: Not on file    Relationship status: Not on file  Other Topics Concern  . Not on file  Social History Narrative  . Not on file     ROS: Unable to obtain    Objective: Vital signs in last 24 hours: Temp:  [97.4 F (36.3 C)-98 F (36.7 C)] 98 F (36.7 C) (09/03 0816) Pulse Rate:  [69-120] 70 (09/03 0745) Resp:  [14-26] 16 (09/03 0745) BP: (91-135)/(55-99) 124/79 (09/03 0745) SpO2:  [93 %-100 %] 98 % (09/03 0745) FiO2 (%):  [45 %-100 %] 45 % (09/03 0415) Weight:  [426 kg-147.4 kg] 116.1 kg (09/03 0500) Weight change:     Intake/Output from previous day: 09/02 0701 - 09/03 0700 In: 3609.1 [I.V.:522.7; IV Piggyback:3086.3] Out: 650 [Urine:650]  PHYSICAL EXAM Constitutional: He is intubated on the ventilator unresponsive but does get agitated.  Eyes: Pupils react.  Ears nose mouth and throat: His throat is clear.  He does have some bite marks on his tongue.  Cardiovascular: His heart is regular with normal heart sounds.  Respiratory:  He has bilateral rhonchi more on the right so he may have aspirated.  Gastrointestinal: His abdomen is soft with no masses.  Bowel sounds sluggish.  Musculoskeletal: He is moving all 4 extremities.  Neurological: Unable to assess.  Psychiatric: Unable to assess  Lab Results: Basic Metabolic Panel: Recent Labs    09/19/17 0100 09/19/17 0252 09/19/17 0720  NA 140  --  139  K 3.2*  --  4.4  CL 102  --  108  CO2 14*  --  24  GLUCOSE 234*  --  125*  BUN 12  --  13  CREATININE 1.34*  --  1.22  CALCIUM 9.2  --  8.2*  MG  --  2.5*  --   PHOS  --  2.6  --    Liver Function Tests: Recent Labs    09/19/17 0100  AST 47*  ALT 38  ALKPHOS 77  BILITOT 0.7  PROT 8.8*  ALBUMIN 4.5   No results for input(s): LIPASE, AMYLASE in the last 72 hours. No results for input(s): AMMONIA in the last 72  hours. CBC: Recent Labs    09/19/17 0100 09/19/17 0252  WBC 14.4*  --   NEUTROABS 7.3  --   HGB 15.4 14.3  HCT 48.3 43.5  MCV 83.6  --   PLT 400  --    Cardiac Enzymes: Recent Labs    09/19/17 0100  TROPONINI <0.03   BNP: No results for input(s): PROBNP in the last 72 hours. D-Dimer: No results for input(s): DDIMER in the last 72 hours. CBG: Recent Labs    09/19/17 0036 09/19/17 0612 09/19/17 0739  GLUCAP 196* 112* 131*   Hemoglobin A1C: No results for input(s): HGBA1C in the last 72 hours. Fasting Lipid Panel: Recent Labs    09/19/17 0524  TRIG 242*   Thyroid Function Tests: No results for input(s): TSH, T4TOTAL, FREET4, T3FREE, THYROIDAB in the last 72 hours. Anemia Panel: No results for input(s): VITAMINB12, FOLATE, FERRITIN, TIBC, IRON, RETICCTPCT in the last 72 hours. Coagulation: Recent Labs    09/19/17 0100  LABPROT 14.6  INR 1.14   Urine Drug Screen: Drugs of Abuse     Component Value Date/Time   LABOPIA NONE DETECTED 09/19/2017 0120   COCAINSCRNUR NONE DETECTED 09/19/2017 0120   LABBENZ NONE DETECTED 09/19/2017 0120   AMPHETMU NONE DETECTED 09/19/2017 0120   THCU POSITIVE (A) 09/19/2017 0120   LABBARB NONE DETECTED 09/19/2017 0120    Alcohol Level: No results for input(s): ETH in the last 72 hours. Urinalysis: Recent Labs    09/19/17 0120  COLORURINE YELLOW  LABSPEC 1.014  PHURINE 5.0  GLUCOSEU NEGATIVE  HGBUR MODERATE*  BILIRUBINUR NEGATIVE  KETONESUR NEGATIVE  PROTEINUR 100*  NITRITE NEGATIVE  LEUKOCYTESUR NEGATIVE   Misc. Labs:   ABGS: Recent Labs    09/19/17 0455  PHART 7.371  PO2ART 75.5*  HCO3 23.8     MICROBIOLOGY: Recent Results (from the past 240 hour(s))  Blood culture (routine x 2)     Status: None (Preliminary result)   Collection Time: 09/19/17  2:04 AM  Result Value Ref Range Status   Specimen Description BLOOD RIGHT HAND  Final   Special Requests   Final    BOTTLES DRAWN AEROBIC AND ANAEROBIC  Blood Culture adequate volume   Culture   Final    NO GROWTH < 12 HOURS Performed at [facility], [address], [place], [place] [address]    Report Status PENDING  Incomplete  Blood  culture (routine x 2)     Status: None (Preliminary result)   Collection Time: 09/19/17  2:06 AM  Result Value Ref Range Status   Specimen Description BLOOD LEFT HAND  Final   Special Requests   Final    BOTTLES DRAWN AEROBIC ONLY Blood Culture adequate volume   Culture   Final    NO GROWTH < 12 HOURS Performed at [facility], [address] [place], [place] [address]    Report Status PENDING  Incomplete  MRSA PCR Screening     Status: None   Collection Time: 09/19/17  3:50 AM  Result Value Ref Range Status   MRSA by PCR NEGATIVE NEGATIVE Final    Comment:        The GeneXpert MRSA Assay (FDA approved for NASAL specimens only), is one component of a comprehensive MRSA colonization surveillance program. It is not intended to diagnose MRSA infection nor to guide or monitor treatment for MRSA infections. Performed at [facility], [address], [place], [place] [address]     Studies/Results: Ct Head Wo Contrast  Result Date: 09/19/2017 CLINICAL DATA:  57 year old male with seizure and encephalopathy. EXAM: CT HEAD WITHOUT CONTRAST TECHNIQUE: Contiguous axial images were obtained from the base of the skull through the vertex without intravenous contrast. COMPARISON:  Head CT dated 10/13/2016 FINDINGS: Brain: The ventricles and sulci appropriate size for patient's age. A focal area of white matter hypodensity in the right frontal lobe appears similar to prior CT. Old right cerebellar infarct noted. There is no acute intracranial hemorrhage. No mass effect or midline shift. No extra-axial fluid collection. Vascular: No hyperdense vessel or unexpected calcification. Skull: Normal. Negative for fracture or focal lesion. Sinuses/Orbits: There is mild mucoperiosteal thickening of paranasal sinuses and  opacification of the nasal passages. Left nasal tube noted. Other: Partially visualized enteric and endotracheal tubes. IMPRESSION: 1. No acute intracranial pathology. 2. Stable focal right frontal periventricular white matter hypodensity and old right cerebellar infarct. Electronically Signed   By: Anner Crete M.D.   On: 09/19/2017 02:02   Dg Chest Portable 1 View  Result Date: 09/19/2017 CLINICAL DATA:  57 year old male with intubation. EXAM: PORTABLE CHEST 1 VIEW COMPARISON:  Chest radiograph dated 03/18/2015 FINDINGS: An endotracheal tube is noted with tip approximately 3.5 cm above the carina. An enteric tube extends into the left hemiabdomen with side-port in the region of the gastroesophageal junction and tip in the proximal stomach. Recommend further advancing of the tube into the stomach by an additional at least 5 cm. There is mild eventration of the right hemidiaphragm. There is no focal consolidation, pleural effusion, or pneumothorax. Mild cardiomegaly. No acute osseous pathology. IMPRESSION: 1. Endotracheal tube above the carina. Enteric tube with side port at the GE junction. Recommend further advancing of the tube into the stomach. 2. No acute cardiopulmonary process. 3. Mild cardiomegaly. Electronically Signed   By: Anner Crete M.D.   On: 09/19/2017 00:56    Medications:  Prior to Admission:  Medications Prior to Admission  Medication Sig Dispense Refill Last Dose  . amLODipine (NORVASC) 10 MG tablet Take 10 mg by mouth daily.  1 03/27/2017 at Unknown time  . aspirin 81 MG chewable tablet Chew 1 tablet (81 mg total) by mouth daily. 30 tablet 0 03/27/2017 at Unknown time  . Eslicarbazepine Acetate (APTIOM) 400 MG TABS Take 1 tablet by mouth 2 (two) times daily.   03/27/2017 at Unknown time  . hydrochlorothiazide (HYDRODIURIL) 25 MG tablet Take 1 tablet  by mouth every morning.  1 03/27/2017 at Unknown time  . levETIRAcetam (KEPPRA) 500 MG tablet Take 1 tablet (500 mg total) by  mouth 2 (two) times daily. (Patient not taking: Reported on 03/28/2017) 60 tablet 1 Not Taking at Unknown time  . levETIRAcetam (KEPPRA) 750 MG tablet Take 2 tablets by mouth 2 (two) times daily.  1 03/27/2017 at Unknown time  . LORazepam (ATIVAN) 1 MG tablet Take 1 every 6 hours if needed for minor seizures or shaking or anxiety 15 tablet 0   . nicotine (NICODERM CQ - DOSED IN MG/24 HOURS) 14 mg/24hr patch Place 1 patch (14 mg total) onto the skin daily. (Patient not taking: Reported on 05/22/2015) 28 patch 0 Completed Course at Unknown time  . olmesartan (BENICAR) 20 MG tablet Take 20 mg by mouth daily.  0 03/27/2017 at Unknown time  . Omega-3 Fatty Acids (FISH OIL) 1000 MG CAPS Take 1 capsule by mouth daily.    Not Taking at Unknown time  . phenytoin (DILANTIN) 100 MG ER capsule Take 2 capsules (200 mg total) by mouth 2 (two) times daily. (Patient not taking: Reported on 03/28/2017) 120 capsule 0 Not Taking at Unknown time  . potassium chloride (K-DUR) 10 MEQ tablet Take 1 tablet (10 mEq total) by mouth daily. 12 tablet 0   . tadalafil (CIALIS) 5 MG tablet Take 5 mg by mouth daily as needed for erectile dysfunction.   unknown   Scheduled: . chlorhexidine gluconate (MEDLINE KIT)  15 mL Mouth Rinse BID  . ipratropium-albuterol  3 mL Nebulization Q6H  . mouth rinse  15 mL Mouth Rinse 10 times per day   Continuous: . 0.45 % NaCl with KCl 20 mEq / L 125 mL/hr at 09/19/17 0544  . sodium chloride 125 mL/hr at 09/19/17 0309  . levETIRAcetam    . octreotide  (SANDOSTATIN)    IV infusion 50 mcg/hr (09/19/17 0538)  . pantoprozole (PROTONIX) infusion 8 mg/hr (09/19/17 0328)  . propofol (DIPRIVAN) infusion 40 mcg/kg/min (09/19/17 0813)   BZJ:[id number] **OR** acetaminophen, fentaNYL (SUBLIMAZE) injection, LORazepam, ondansetron **OR** ondansetron (ZOFRAN) IV  Assesment: He was admitted with status epilepticus.  He was intubated for airway protection and he is currently on propofol.  He has history of  alcohol abuse and is on protocol for alcohol withdrawal.  He had lactic acidosis that I suspect is from his seizures.  He has upper GI bleed but hemoglobin is stable when checked last.  He may have aspirated based on his physical examination and I am going to repeat his chest x-ray.  He does smoke both tobacco and marijuana so he has potential for COPD and I started him on treatment for that. Principal Problem:   Status epilepticus ([place]) Active Problems:   History of hypertension   Hypokalemia   Alcohol abuse   Lactic acidosis   UGI bleed    Plan: No plans for weaning or extubation today.  I have written orders for ventilator management and for sedation.    LOS: 0 days   Anecia Nusbaum L 09/19/2017, 8:17 AM

## 2017-09-19 NOTE — Progress Notes (Signed)
Reginald [name] is a 57 y.o. male with medical history significant of hypertension, seizure disorder alcohol abuse who was brought to the emergency department via EMS with status epilepticus.  He was intubated for airway protection.  Apparently, the patient uses cannabis for his seizures, but his wife stated to the ED nursing staff that he has not used that in the past couple days and may be the reason why he is having seizures.  He sees Dr. Gerilyn Pilgrim is his neurologist and has been using his other seizure medication of Apitom as prescribed.  He does have a history of chronic alcohol abuse and drinks alcohol with his coffee in the mornings.  He was noted to have a significant amount of bloody output after NG tube was inserted subsequent to intubation.  He has been started on Keppra, Protonix infusion, octreotide, propofol, and IV fluid.  I have seen and evaluated the patient this morning and have continue the IV fluid.  His H&H thus far has remained stable and vitals are currently stable.  Appreciate consultation with pulmonology, neurology, and gastroenterology for further assistance in management.  A.m. labs have been ordered.

## 2017-09-19 NOTE — ED Notes (Addendum)
Date and time results received: 09/19/17 0212  Test: I Stat Lactic Critical Value: >17.0  Name of Provider Notified: Rancour, MD

## 2017-09-19 NOTE — H&P (Signed)
History and Physical    Denny Mccree ZOX:[id number] DOB: May 08, 1960 DOA: 09/19/2017  PCP: Tally Joe, MD   Patient coming from: Home.  I have personally briefly reviewed patient's old medical records in [facility]  Chief Complaint: Seizures.  HPI: Reginald [name] is a 57 y.o. male with medical history significant of hypertension, seizure disorder alcohol abuse who was brought to the emergency department via EMS with status epilepticus.  He was intubated for airway protection.  Apparently, the patient uses cannabis for his seizures, but his wife stated to the ED nursing staff that he has not used that in the past couple days and may be the reason why he is having seizures.  After the NG tube was inserted, he draining a substantial amount of blood, which raises the possibility of UGI.  I doubt that the patient bleed down much from tongue bite.  There is no further history is available.  ED Course: Initial temperature 97.4 F, pulse 120, respirations 26 and O2 sat 100% on unspecified supplemental oxygen.  He was given thousand milligrams of Keppra IVPB, 1000 mL IV bolus.  He was started on a Protonix infusion.  He was started on Diprivan for sedation.  White count was 14.4 with normal differential, hemoglobin 15.4 g/dL and platelets 811.  His UDS was positive for THC.  Urinalysis had moderate hemoglobinuria and 100 mg/dL of proteinuria.  Sodium 140, potassium 3.2, chloride 101 and CO2 14.  Lactic acid 1 was more than 17 and second was 14.84 mmol/L.  PT, INR and APTT were within normal limits.  Chest radiograph and CT head did not show any acute pathology.  Review of Systems: Unable to obtain.   Past Medical History:  Diagnosis Date  . Hypertension   . Seizures (HCC)   . Wears glasses     Past Surgical History:  Procedure Laterality Date  . COLONOSCOPY    . SHOULDER ARTHROSCOPY WITH SUBACROMIAL DECOMPRESSION Right 05/06/2013   Procedure: Right shoulder arthroscopy rotator cuff  tear repair, subacromial decompression;  Surgeon: Mable Paris, MD;  Location: [facility];  Service: Orthopedics;  Laterality: Right;  Right shoulder arthroscopy rotator cuff tear repair, subacromial decompression  . TONSILLECTOMY    . UMBILICAL HERNIA REPAIR  2007  . WISDOM TOOTH EXTRACTION       reports that he has been smoking. He has been smoking about 0.25 packs per day. He uses smokeless tobacco. He reports that he drinks alcohol. He reports that he has current or past drug history. Drug: Marijuana.  Allergies  Allergen Reactions  . Penicillins Itching    Has patient had a PCN reaction causing immediate rash, facial/tongue/throat swelling, SOB or lightheadedness with hypotension: Yes/no Has patient had a PCN reaction causing severe rash involving mucus membranes or skin necrosis: no Has patient had a PCN reaction that required hospitalizationno Has patient had a PCN reaction occurring within the last 10 years: No If all of the above answers are "NO", then may proceed with Cephalosporin     Family History  Problem Relation Age of Onset  . Hypertension Mother   . Breast cancer Sister     Prior to Admission medications   Medication Sig Start Date End Date Taking? Authorizing Provider  amLODipine (NORVASC) 10 MG tablet Take 10 mg by mouth daily. 03/11/17   [provider]  aspirin 81 MG chewable tablet Chew 1 tablet (81 mg total) by mouth daily. 03/20/15   Rhetta Mura, MD  Eslicarbazepine Acetate (APTIOM)  400 MG TABS Take 1 tablet by mouth 2 (two) times daily.    [provider]  hydrochlorothiazide (HYDRODIURIL) 25 MG tablet Take 1 tablet by mouth every morning. 03/02/17   [provider]  levETIRAcetam (KEPPRA) 500 MG tablet Take 1 tablet (500 mg total) by mouth 2 (two) times daily. Patient not taking: Reported on 03/28/2017 05/24/15   Erick Blinks, MD  levETIRAcetam (KEPPRA) 750 MG tablet Take 2 tablets by mouth 2 (two)  times daily. 03/02/17   [provider]  LORazepam (ATIVAN) 1 MG tablet Take 1 every 6 hours if needed for minor seizures or shaking or anxiety 03/28/17   Bethann Berkshire, MD  nicotine (NICODERM CQ - DOSED IN MG/24 HOURS) 14 mg/24hr patch Place 1 patch (14 mg total) onto the skin daily. Patient not taking: Reported on 05/22/2015 03/20/15   Rhetta Mura, MD  olmesartan (BENICAR) 20 MG tablet Take 20 mg by mouth daily. 03/01/17   [provider]  Omega-3 Fatty Acids (FISH OIL) 1000 MG CAPS Take 1 capsule by mouth daily.     [provider]  phenytoin (DILANTIN) 100 MG ER capsule Take 2 capsules (200 mg total) by mouth 2 (two) times daily. Patient not taking: Reported on 03/28/2017 05/24/15   Erick Blinks, MD  potassium chloride (K-DUR) 10 MEQ tablet Take 1 tablet (10 mEq total) by mouth daily. 04/29/17   Bethann Berkshire, MD  tadalafil (CIALIS) 5 MG tablet Take 5 mg by mouth daily as needed for erectile dysfunction.    [provider]    Physical Exam: Vitals:   09/19/17 0130 09/19/17 0148 09/19/17 0151 09/19/17 0230  BP: (!) 119/99 108/65 120/71 101/85  Pulse: (!) 120 (!) 102 (!) 104 85  Resp: (!) 26 (!) 25 (!) 23 20  Temp:      TempSrc:      SpO2: 93% 98% 96% 93%  Weight:      Height:        Constitutional: NAD, calm, comfortable Eyes: PERRL, lids and conjunctivae normal ENMT: Endotracheal and gastric tubes in place. Neck: normal, supple, no masses, no thyromegaly Respiratory: clear to auscultation bilaterally, no wheezing, no crackles. Normal respiratory effort. No accessory muscle use.  Cardiovascular: Regular rate and rhythm, no murmurs / rubs / gallops. No extremity edema. 2+ pedal pulses. No carotid bruits.  Abdomen: Soft, no tenderness, no masses palpated. No hepatosplenomegaly. Bowel sounds positive.  Musculoskeletal: no clubbing / cyanosis. Good ROM, no contractures.  Relaxed muscle tone.  Skin: no rashes, lesions, ulcers. No  induration Neurologic: Sedated on mechanical ventilation. Psychiatric: Sedated.  Labs on Admission: I have personally reviewed following labs and imaging studies  CBC: Recent Labs  Lab 09/19/17 0100  WBC 14.4*  NEUTROABS 7.3  HGB 15.4  HCT 48.3  MCV 83.6  PLT 400   Basic Metabolic Panel: Recent Labs  Lab 09/19/17 0100  NA 140  K 3.2*  CL 102  CO2 14*  GLUCOSE 234*  BUN 12  CREATININE 1.34*  CALCIUM 9.2   GFR: Estimated Creatinine Clearance: 96.6 mL/min (A) (by C-G formula based on SCr of 1.34 mg/dL (H)). Liver Function Tests: Recent Labs  Lab 09/19/17 0100  AST 47*  ALT 38  ALKPHOS 77  BILITOT 0.7  PROT 8.8*  ALBUMIN 4.5   No results for input(s): LIPASE, AMYLASE in the last 168 hours. No results for input(s): AMMONIA in the last 168 hours. Coagulation Profile: Recent Labs  Lab 09/19/17 0100  INR 1.14   Cardiac  Enzymes: Recent Labs  Lab 09/19/17 0100  TROPONINI <0.03   BNP (last 3 results) No results for input(s): PROBNP in the last 8760 hours. HbA1C: No results for input(s): HGBA1C in the last 72 hours. CBG: Recent Labs  Lab 09/19/17 0036  GLUCAP 196*   Lipid Profile: No results for input(s): CHOL, HDL, LDLCALC, TRIG, CHOLHDL, LDLDIRECT in the last 72 hours. Thyroid Function Tests: No results for input(s): TSH, T4TOTAL, FREET4, T3FREE, THYROIDAB in the last 72 hours. Anemia Panel: No results for input(s): VITAMINB12, FOLATE, FERRITIN, TIBC, IRON, RETICCTPCT in the last 72 hours. Urine analysis:    Component Value Date/Time   COLORURINE YELLOW 09/19/2017 0120   APPEARANCEUR CLEAR 09/19/2017 0120   LABSPEC 1.014 09/19/2017 0120   PHURINE 5.0 09/19/2017 0120   GLUCOSEU NEGATIVE 09/19/2017 0120   HGBUR MODERATE (A) 09/19/2017 0120   BILIRUBINUR NEGATIVE 09/19/2017 0120   KETONESUR NEGATIVE 09/19/2017 0120   PROTEINUR 100 (A) 09/19/2017 0120   NITRITE NEGATIVE 09/19/2017 0120   LEUKOCYTESUR NEGATIVE 09/19/2017 0120    Radiological  Exams on Admission: Ct Head Wo Contrast  Result Date: 09/19/2017 CLINICAL DATA:  57 year old male with seizure and encephalopathy. EXAM: CT HEAD WITHOUT CONTRAST TECHNIQUE: Contiguous axial images were obtained from the base of the skull through the vertex without intravenous contrast. COMPARISON:  Head CT dated 10/13/2016 FINDINGS: Brain: The ventricles and sulci appropriate size for patient's age. A focal area of white matter hypodensity in the right frontal lobe appears similar to prior CT. Old right cerebellar infarct noted. There is no acute intracranial hemorrhage. No mass effect or midline shift. No extra-axial fluid collection. Vascular: No hyperdense vessel or unexpected calcification. Skull: Normal. Negative for fracture or focal lesion. Sinuses/Orbits: There is mild mucoperiosteal thickening of paranasal sinuses and opacification of the nasal passages. Left nasal tube noted. Other: Partially visualized enteric and endotracheal tubes. IMPRESSION: 1. No acute intracranial pathology. 2. Stable focal right frontal periventricular white matter hypodensity and old right cerebellar infarct. Electronically Signed   By: Elgie Collard M.D.   On: 09/19/2017 02:02   Dg Chest Portable 1 View  Result Date: 09/19/2017 CLINICAL DATA:  57 year old male with intubation. EXAM: PORTABLE CHEST 1 VIEW COMPARISON:  Chest radiograph dated 03/18/2015 FINDINGS: An endotracheal tube is noted with tip approximately 3.5 cm above the carina. An enteric tube extends into the left hemiabdomen with side-port in the region of the gastroesophageal junction and tip in the proximal stomach. Recommend further advancing of the tube into the stomach by an additional at least 5 cm. There is mild eventration of the right hemidiaphragm. There is no focal consolidation, pleural effusion, or pneumothorax. Mild cardiomegaly. No acute osseous pathology. IMPRESSION: 1. Endotracheal tube above the carina. Enteric tube with side port at the GE  junction. Recommend further advancing of the tube into the stomach. 2. No acute cardiopulmonary process. 3. Mild cardiomegaly. Electronically Signed   By: Elgie Collard M.D.   On: 09/19/2017 00:56    EKG: Independently reviewed. Vent. rate 113 BPM PR interval * ms QRS duration 110 ms QT/QTc 360/494 ms P-R-T axes 154 101 173 Sinus or ectopic atrial tachycardia Probable right ventricular hypertrophy Probable lateral infarct, age indeterminate  Assessment/Plan Principal Problem:   Status epilepticus (HCC) Admit to inpatient/ICU. Continue mechanical ventilation. Continue Keppra 1000 mg every 12 hours. Continue current sedation. Consider evaluation by neurology. Consult pulmonology for vent management.  Active Problems:   UGI bleed Continue Protonix infusion. There is a history of alcohol abuse, so will  start Sandostatin. Consult gastroenterology. Monitor hematocrit and hemoglobin.    History of hypertension Monitor blood pressure.    Hypokalemia Replacing. Follow-up potassium level.    Alcohol abuse Unknown details at this time. Currently on propofol infusion. Monitor for signs of DTs.    Lactic acidosis Continue mechanical ventilation. Continue IV fluids Follow alcohol level.    DVT prophylaxis: SCDs. Code Status: Full code. Family Communication: None at bedside. Disposition Plan: Admit for further work-up and treatment. Consults called: Routine gastroenterology and routine pulmonology consult Admission status: Inpatient/ICU.   Bobette Mo MD [facility] Pager [phone]  If 7PM-7AM, please contact night-coverage [email or web address] Password [email or web address]  09/19/2017, 2:50 AM

## 2017-09-20 ENCOUNTER — Inpatient Hospital Stay (HOSPITAL_COMMUNITY): Payer: No Typology Code available for payment source

## 2017-09-20 DIAGNOSIS — K922 Gastrointestinal hemorrhage, unspecified: Secondary | ICD-10-CM

## 2017-09-20 LAB — URINALYSIS, ROUTINE W REFLEX MICROSCOPIC
Bacteria, UA: NONE SEEN
Bilirubin Urine: NEGATIVE
GLUCOSE, UA: NEGATIVE mg/dL
KETONES UR: NEGATIVE mg/dL
LEUKOCYTES UA: NEGATIVE
NITRITE: NEGATIVE
PH: 5 (ref 5.0–8.0)
Protein, ur: NEGATIVE mg/dL
RBC / HPF: >50 RBC/hpf — ABNORMAL HIGH (ref 0–5)
SPECIFIC GRAVITY, URINE: 1.016 (ref 1.005–1.030)

## 2017-09-20 LAB — BLOOD GAS, ARTERIAL
Acid-Base Excess: 0.6 mmol/L (ref 0.0–2.0)
Bicarbonate: 24.9 mmol/L (ref 20.0–28.0)
Drawn by: 21310
FIO2: 45
O2 Saturation: 94.7 %
PCO2 ART: 39.9 mmHg (ref 32.0–48.0)
PEEP: 5 cmH2O
PO2 ART: 76.6 mmHg — AB (ref 83.0–108.0)
Patient temperature: 37
RATE: 16 resp/min
VT: 650 mL
pH, Arterial: 7.409 (ref 7.350–7.450)

## 2017-09-20 LAB — COMPREHENSIVE METABOLIC PANEL
ALBUMIN: 3.1 g/dL — AB (ref 3.5–5.0)
ALT: 30 U/L (ref 0–44)
AST: 43 U/L — AB (ref 15–41)
Alkaline Phosphatase: 54 U/L (ref 38–126)
Anion gap: 4 — ABNORMAL LOW (ref 5–15)
BILIRUBIN TOTAL: 0.6 mg/dL (ref 0.3–1.2)
BUN: 14 mg/dL (ref 6–20)
CALCIUM: 8 mg/dL — AB (ref 8.9–10.3)
CO2: 28 mmol/L (ref 22–32)
Chloride: 109 mmol/L (ref 98–111)
Creatinine, Ser: 1.66 mg/dL — ABNORMAL HIGH (ref 0.61–1.24)
GFR calc Af Amer: 52 mL/min — ABNORMAL LOW (ref >60)
GFR calc non Af Amer: 45 mL/min — ABNORMAL LOW (ref >60)
GLUCOSE: 84 mg/dL (ref 70–99)
Potassium: 3.4 mmol/L — ABNORMAL LOW (ref 3.5–5.1)
SODIUM: 141 mmol/L (ref 135–145)
TOTAL PROTEIN: 5.9 g/dL — AB (ref 6.5–8.1)

## 2017-09-20 LAB — CBC WITH DIFFERENTIAL/PLATELET
BASOS PCT: 0 %
Basophils Absolute: 0 10*3/uL (ref 0.0–0.1)
Eosinophils Absolute: 0.1 10*3/uL (ref 0.0–0.7)
Eosinophils Relative: 1 %
HEMATOCRIT: 37.6 % — AB (ref 39.0–52.0)
HEMOGLOBIN: 11.8 g/dL — AB (ref 13.0–17.0)
Lymphocytes Relative: 30 %
Lymphs Abs: 3 10*3/uL (ref 0.7–4.0)
MCH: 25.7 pg — ABNORMAL LOW (ref 26.0–34.0)
MCHC: 31.4 g/dL (ref 30.0–36.0)
MCV: 81.7 fL (ref 78.0–100.0)
MONOS PCT: 12 %
Monocytes Absolute: 1.2 10*3/uL — ABNORMAL HIGH (ref 0.1–1.0)
NEUTROS PCT: 57 %
Neutro Abs: 5.8 10*3/uL (ref 1.7–7.7)
Platelets: 265 10*3/uL (ref 150–400)
RBC: 4.6 MIL/uL (ref 4.22–5.81)
RDW: 16.3 % — ABNORMAL HIGH (ref 11.5–15.5)
WBC: 10.1 10*3/uL (ref 4.0–10.5)

## 2017-09-20 LAB — URINE CULTURE: CULTURE: NO GROWTH

## 2017-09-20 LAB — GLUCOSE, CAPILLARY: GLUCOSE-CAPILLARY: 76 mg/dL (ref 70–99)

## 2017-09-20 MED ORDER — SODIUM CHLORIDE 0.9 % IV BOLUS
1000.0000 mL | Freq: Once | INTRAVENOUS | Status: AC
  Administered 2017-09-20: 1000 mL via INTRAVENOUS

## 2017-09-20 MED ORDER — MIDAZOLAM HCL 2 MG/2ML IJ SOLN
2.0000 mg | INTRAMUSCULAR | Status: DC | PRN
  Administered 2017-09-20 – 2017-09-21 (×8): 2 mg via INTRAVENOUS
  Filled 2017-09-20 (×8): qty 2

## 2017-09-20 NOTE — Progress Notes (Signed)
Phlebotomy went in to get pts labs and pt woke up agitated, very combative. Swinging arms, trying to pull at foley catheter and ventilator. Constantly trying to get up OOB- an RT, 2 RNs and NT in room with pt. Pts fentanyl increased to 175- profofol is maxed out at . Ativan 2mg  given to pt d/t him not being sedated after numerous attempts with relaxing/calming techniques. Dr. Robb Matar paged and made aware of pt being very agitated, Ativan being given as well as Fentanyl dose being increased. Dr. Robb Matar stated that he would order Versed for pt and continue to monitor pts BP. RN reminded MD about pts alcohol consumption- did not want to transition to another medication at the moment.  Will continue to monitor pt

## 2017-09-20 NOTE — Progress Notes (Addendum)
After paging Dr. Robb Matar, new order for bolus to be given.  Bolus started-Propofol and Fentanyl gtt turned down as well to help increase BP. will continue to monitor pt

## 2017-09-20 NOTE — Progress Notes (Signed)
After trying to wean pt, pt started waking up and continuously moving in the bed. Swaying arms and trying to pull at tubes. BP increased to 116/61. Profofol increased back to and fentanly increased to per protocol. Will continue to monitor pt

## 2017-09-20 NOTE — Progress Notes (Signed)
Subjective: He remains intubated and on the ventilator.  He is been hypotensive I think because of his sedation and still gets very agitated with any sort of stimulation.  He is received IV fluids but he has some edema of the upper extremities.  Chest x-ray which has not been read but which I have reviewed personally looks like he may have some volume overload but there is of course concerned about aspiration considering his status epilepticus.  He certainly may be having alcohol withdrawal considering his alcoholism and he would be at the point of DTs now  Objective: Vital signs in last 24 hours: Temp:  [98 F (36.7 C)-99.4 F (37.4 C)] 98.8 F (37.1 C) (09/04 0748) Pulse Rate:  [59-104] 59 (09/04 0748) Resp:  [14-22] 16 (09/04 0748) BP: (68-145)/(41-104) 80/53 (09/04 0645) SpO2:  [90 %-99 %] 97 % (09/04 0748) FiO2 (%):  [45 %] 45 % (09/04 0356) Weight:  [120.5 kg] 120.5 kg (09/04 0500) Weight change: 5.5 kg Last BM Date: (unknown)  Intake/Output from previous day: 09/03 0701 - 09/04 0700 In: 6754.3 [I.V.:5165.1; IV Piggyback:1589.2] Out: 1550 [Urine:1550]  PHYSICAL EXAM General appearance: Intubated sedated on mechanical ventilation Resp: rhonchi bilaterally Cardio: regular rate and rhythm, S1, S2 normal, no murmur, click, rub or gallop GI: soft, non-tender; bowel sounds normal; no masses,  no organomegaly Extremities: He has some edema more of his arms and his legs  Lab Results:  Results for orders placed or performed during the hospital encounter of 09/19/17 (from the past 48 hour(s))  CBG monitoring, ED     Status: Abnormal   Collection Time: 09/19/17 12:36 AM  Result Value Ref Range   Glucose-Capillary 196 (H) 70 - 99 mg/dL   Comment 1 Notify RN   CBC with Differential/Platelet     Status: Abnormal   Collection Time: 09/19/17  1:00 AM  Result Value Ref Range   WBC 14.4 (H) 4.0 - 10.5 K/uL   RBC 5.78 4.22 - 5.81 MIL/uL   Hemoglobin 15.4 13.0 - 17.0 g/dL   HCT 48.3  39.0 - 52.0 %   MCV 83.6 78.0 - 100.0 fL   MCH 26.6 26.0 - 34.0 pg   MCHC 31.9 30.0 - 36.0 g/dL   RDW 15.6 (H) 11.5 - 15.5 %   Platelets 400 150 - 400 K/uL   Neutrophils Relative % 51 %   Neutro Abs 7.3 1.7 - 7.7 K/uL   Lymphocytes Relative 40 %   Lymphs Abs 5.8 (H) 0.7 - 4.0 K/uL   Monocytes Relative 8 %   Monocytes Absolute 1.1 (H) 0.1 - 1.0 K/uL   Eosinophils Relative 1 %   Eosinophils Absolute 0.1 0.0 - 0.7 K/uL   Basophils Relative 0 %   Basophils Absolute 0.1 0.0 - 0.1 K/uL    Comment: Performed at [facility], [address], [place], [place] [address]  Comprehensive metabolic panel     Status: Abnormal   Collection Time: 09/19/17  1:00 AM  Result Value Ref Range   Sodium 140 135 - 145 mmol/L   Potassium 3.2 (L) 3.5 - 5.1 mmol/L   Chloride 102 98 - 111 mmol/L   CO2 14 (L) 22 - 32 mmol/L   Glucose, Bld 234 (H) 70 - 99 mg/dL   BUN 12 6 - 20 mg/dL   Creatinine, Ser 1.34 (H) 0.61 - 1.24 mg/dL   Calcium 9.2 8.9 - 10.3 mg/dL   Total Protein 8.8 (H) 6.5 - 8.1 g/dL   Albumin 4.5 3.5 -  5.0 g/dL   AST 47 (H) 15 - 41 U/L   ALT 38 0 - 44 U/L   Alkaline Phosphatase 77 38 - 126 U/L   Total Bilirubin 0.7 0.3 - 1.2 mg/dL   GFR calc non Af Amer 58 (L) >60 mL/min   GFR calc Af Amer >60 >60 mL/min    Comment: (NOTE) The eGFR has been calculated using the CKD EPI equation. This calculation has not been validated in all clinical situations. eGFR's persistently <60 mL/min signify possible Chronic Kidney Disease.    Anion gap 24 (H) 5 - 15    Comment: Performed at [facility], [address], [place], [place] [address]  Troponin I     Status: None   Collection Time: 09/19/17  1:00 AM  Result Value Ref Range   Troponin I <0.03 <0.03 ng/mL    Comment: Performed at [facility], [address] [place], [place] [address]  Protime-INR     Status: None   Collection Time: 09/19/17  1:00 AM  Result Value Ref Range   Prothrombin Time 14.6 11.4 - 15.2 seconds   INR 1.14     Comment:  Performed at [facility], [address] [place], [place] [address]  APTT     Status: Abnormal   Collection Time: 09/19/17  1:00 AM  Result Value Ref Range   aPTT 46 (H) 24 - 36 seconds    Comment:        IF BASELINE aPTT IS ELEVATED, SUGGEST PATIENT RISK ASSESSMENT BE USED TO DETERMINE APPROPRIATE ANTICOAGULANT THERAPY. Performed at [facility], [address], [place], [place] [address]   I-Stat CG4 Lactic Acid, ED     Status: Abnormal   Collection Time: 09/19/17  1:01 AM  Result Value Ref Range   Lactic Acid, Venous >17.00 (HH) 0.5 - 1.9 mmol/L  Urinalysis, Routine w reflex microscopic     Status: Abnormal   Collection Time: 09/19/17  1:20 AM  Result Value Ref Range   Color, Urine YELLOW YELLOW   APPearance CLEAR CLEAR   Specific Gravity, Urine 1.014 1.005 - 1.030   pH 5.0 5.0 - 8.0   Glucose, UA NEGATIVE NEGATIVE mg/dL   Hgb urine dipstick MODERATE (A) NEGATIVE   Bilirubin Urine NEGATIVE NEGATIVE   Ketones, ur NEGATIVE NEGATIVE mg/dL   Protein, ur 100 (A) NEGATIVE mg/dL   Nitrite NEGATIVE NEGATIVE   Leukocytes, UA NEGATIVE NEGATIVE   RBC / HPF 6-10 0 - 5 RBC/hpf   WBC, UA 0-5 0 - 5 WBC/hpf   Bacteria, UA FEW (A) NONE SEEN   Mucus PRESENT    Sperm, UA PRESENT     Comment: Performed at [facility], [address], [place], [place] [address]  Rapid urine drug screen (hospital performed)     Status: Abnormal   Collection Time: 09/19/17  1:20 AM  Result Value Ref Range   Opiates NONE DETECTED NONE DETECTED   Cocaine NONE DETECTED NONE DETECTED   Benzodiazepines NONE DETECTED NONE DETECTED   Amphetamines NONE DETECTED NONE DETECTED   Tetrahydrocannabinol POSITIVE (A) NONE DETECTED   Barbiturates NONE DETECTED NONE DETECTED    Comment: (NOTE) DRUG SCREEN FOR MEDICAL PURPOSES ONLY.  IF CONFIRMATION IS NEEDED FOR ANY PURPOSE, NOTIFY LAB WITHIN 5 DAYS. LOWEST DETECTABLE LIMITS FOR URINE DRUG SCREEN Drug Class                     Cutoff (ng/mL) Amphetamine and  metabolites    1000 Barbiturate  and metabolites    200 Benzodiazepine                 811 Tricyclics and metabolites     300 Opiates and metabolites        300 Cocaine and metabolites        300 THC                            50 Performed at [facility], [address], [place], [place] [address]   Blood culture (routine x 2)     Status: None (Preliminary result)   Collection Time: 09/19/17  2:04 AM  Result Value Ref Range   Specimen Description BLOOD RIGHT HAND    Special Requests      BOTTLES DRAWN AEROBIC AND ANAEROBIC Blood Culture adequate volume   Culture      NO GROWTH 1 DAY Performed at [facility], [address], [place], [place] [address]    Report Status PENDING   Blood culture (routine x 2)     Status: None (Preliminary result)   Collection Time: 09/19/17  2:06 AM  Result Value Ref Range   Specimen Description BLOOD LEFT HAND    Special Requests      BOTTLES DRAWN AEROBIC ONLY Blood Culture adequate volume   Culture      NO GROWTH 1 DAY Performed at [facility], [address], [place], [place] [address]    Report Status PENDING   Blood gas, arterial     Status: Abnormal   Collection Time: 09/19/17  2:10 AM  Result Value Ref Range   FIO2 100.00    Delivery systems VENTILATOR    Mode PRESSURE REGULATED VOLUME CONTROL    VT 600 mL   LHR 18 resp/min   Peep/cpap 5.0 cm H20   pH, Arterial 7.318 (L) 7.350 - 7.450   pCO2 arterial 43.1 32.0 - 48.0 mmHg   pO2, Arterial 128.0 (H) 83.0 - 108.0 mmHg   Bicarbonate 21.1 20.0 - 28.0 mmol/L   O2 Saturation 97.8 %   Collection site RIGHT RADIAL    Drawn by [id number]    Sample type ARTERIAL    Allens test (pass/fail) PASS PASS    Comment: Performed at [facility], [address] [place], [place] [address]  Type and screen [facility]     Status: None   Collection Time: 09/19/17  2:52 AM  Result Value Ref Range   ABO/RH(D) A POS    Antibody Screen NEG    Sample Expiration      09/22/2017 Performed at [facility], [address] [place], [place] [address]   Hemoglobin and hematocrit, blood     Status: None   Collection Time: 09/19/17  2:52 AM  Result Value Ref Range   Hemoglobin 14.3 13.0 - 17.0 g/dL   HCT 43.5 39.0 - 52.0 %    Comment: Performed at [facility], [address], [place], [place] [address]  Lactic acid, plasma     Status: Abnormal   Collection Time: 09/19/17  2:52 AM  Result Value Ref Range   Lactic Acid, Venous 4.3 (HH) 0.5 - 1.9 mmol/L    Comment: CRITICAL RESULT CALLED TO, READ BACK BY AND VERIFIED WITH: HEARN,J. AT 0355 ON 09/19/2017 BY EVA Performed at [facility], [address] [place], [place] [address]   Magnesium     Status: Abnormal   Collection Time: 09/19/17  2:52 AM  Result Value Ref Range   Magnesium 2.5 (H) 1.7 - 2.4 mg/dL    Comment: Performed at [facility], [address] [place], [place] [address]  Phosphorus     Status: None   Collection Time: 09/19/17  2:52 AM  Result Value Ref Range   Phosphorus 2.6 2.5 - 4.6 mg/dL    Comment: Performed at [facility], [address] [place], [place] [address]  I-Stat CG4 Lactic Acid, ED     Status: Abnormal   Collection Time: 09/19/17  2:56 AM  Result Value Ref Range   Lactic Acid, Venous 4.84 (HH) 0.5 - 1.9 mmol/L   Comment NOTIFIED PHYSICIAN   MRSA PCR Screening     Status: None   Collection Time: 09/19/17  3:50 AM  Result Value Ref Range   MRSA by PCR NEGATIVE NEGATIVE    Comment:        The GeneXpert MRSA Assay (FDA approved for NASAL specimens only), is one component of a comprehensive MRSA colonization surveillance program. It is not intended to diagnose MRSA infection nor to guide or monitor treatment for MRSA infections. Performed at [facility], [address] [place], [place] [address]   Blood gas, arterial     Status: Abnormal   Collection Time: 09/19/17  4:55 AM  Result Value Ref Range   FIO2 45.00    Delivery systems VENTILATOR    Mode PRESSURE REGULATED VOLUME CONTROL    VT 650 mL    LHR 16 resp/min   Peep/cpap 5.0 cm H20   pH, Arterial 7.371 7.350 - 7.450   pCO2 arterial 42.7 32.0 - 48.0 mmHg   pO2, Arterial 75.5 (L) 83.0 - 108.0 mmHg   Bicarbonate 23.8 20.0 - 28.0 mmol/L   Acid-base deficit 0.4 0.0 - 2.0 mmol/L   O2 Saturation 93.2 %   Collection site RIGHT RADIAL    Drawn by [id number]    Sample type ARTERIAL    Allens test (pass/fail) PASS PASS    Comment: Performed at [facility], [address], [place], [place] [address]  Triglycerides     Status: Abnormal   Collection Time: 09/19/17  5:24 AM  Result Value Ref Range   Triglycerides 242 (H) <150 mg/dL    Comment: Performed at [facility], [address], [place], [place] [address]  Glucose, capillary     Status: Abnormal   Collection Time: 09/19/17  6:12 AM  Result Value Ref Range   Glucose-Capillary 112 (H) 70 - 99 mg/dL  Basic metabolic panel     Status: Abnormal   Collection Time: 09/19/17  7:20 AM  Result Value Ref Range   Sodium 139 135 - 145 mmol/L   Potassium 4.4 3.5 - 5.1 mmol/L    Comment: DELTA CHECK NOTED   Chloride 108 98 - 111 mmol/L   CO2 24 22 - 32 mmol/L   Glucose, Bld 125 (H) 70 - 99 mg/dL   BUN 13 6 - 20 mg/dL   Creatinine, Ser 1.22 0.61 - 1.24 mg/dL   Calcium 8.2 (L) 8.9 - 10.3 mg/dL   GFR calc non Af Amer >60 >60 mL/min   GFR calc Af Amer >60 >60 mL/min    Comment: (NOTE) The eGFR has been calculated using the CKD EPI equation. This calculation has not been validated in all clinical situations. eGFR's persistently <60 mL/min signify possible Chronic Kidney Disease.    Anion gap 7 5 - 15    Comment: Performed at [facility], [address],  [place], [place] [address]  Type and screen [facility]     Status: None   Collection Time: 09/19/17  7:20 AM  Result Value Ref Range   ABO/RH(D) A POS    Antibody Screen NEG    Sample Expiration      09/22/2017 Performed at [facility], [address] [place], [place] [address]   Lactic acid, plasma     Status: None    Collection Time: 09/19/17  7:20 AM  Result Value Ref Range   Lactic Acid, Venous 1.3 0.5 - 1.9 mmol/L    Comment: Performed at [facility], [address] [place], [place] [address]  Glucose, capillary     Status: Abnormal   Collection Time: 09/19/17  7:39 AM  Result Value Ref Range   Glucose-Capillary 131 (H) 70 - 99 mg/dL  Hemoglobin and hematocrit, blood     Status: None   Collection Time: 09/19/17  8:48 AM  Result Value Ref Range   Hemoglobin 13.3 13.0 - 17.0 g/dL   HCT 41.3 39.0 - 52.0 %    Comment: Performed at [facility], [address], [place], [place] [address]  Triglycerides     Status: Abnormal   Collection Time: 09/19/17  8:48 AM  Result Value Ref Range   Triglycerides 202 (H) <150 mg/dL    Comment: Performed at [facility], [address], [place], [place] [address]  Glucose, capillary     Status: Abnormal   Collection Time: 09/19/17 12:34 PM  Result Value Ref Range   Glucose-Capillary 137 (H) 70 - 99 mg/dL  Hemoglobin and hematocrit, blood     Status: Abnormal   Collection Time: 09/19/17  2:48 PM  Result Value Ref Range   Hemoglobin 12.5 (L) 13.0 - 17.0 g/dL   HCT 38.6 (L) 39.0 - 52.0 %    Comment: Performed at [facility], [address], [place], [place] [address]  Glucose, capillary     Status: Abnormal   Collection Time: 09/19/17  5:17 PM  Result Value Ref Range   Glucose-Capillary 111 (H) 70 - 99 mg/dL  CBC     Status: Abnormal   Collection Time: 09/19/17  7:00 PM  Result Value Ref Range   WBC 12.1 (H) 4.0 - 10.5 K/uL   RBC 4.68 4.22 - 5.81 MIL/uL   Hemoglobin 12.2 (L) 13.0 - 17.0 g/dL   HCT 37.8 (L) 39.0 - 52.0 %   MCV 80.8 78.0 - 100.0 fL   MCH 26.1 26.0 - 34.0 pg   MCHC 32.3 30.0 - 36.0 g/dL   RDW 15.8 (H) 11.5 - 15.5 %   Platelets 300 150 - 400 K/uL    Comment: Performed at [facility], [address] [place], [place] [address]  Glucose, capillary     Status: Abnormal   Collection Time: 09/19/17 11:54 PM  Result Value Ref Range    Glucose-Capillary 102 (H) 70 - 99 mg/dL  Blood gas, arterial     Status: Abnormal   Collection Time: 09/20/17  6:02 AM  Result Value Ref Range   FIO2 45.00    Delivery systems VENTILATOR    Mode PRESSURE REGULATED VOLUME CONTROL    VT 650 mL   LHR 16.0 resp/min   Peep/cpap 5.0 cm H20   pH, Arterial 7.409 7.350 - 7.450   pCO2 arterial 39.9 32.0 - 48.0 mmHg   pO2, Arterial 76.6 (L) 83.0 - 108.0 mmHg   Bicarbonate 24.9 20.0 - 28.0 mmol/L   Acid-Base Excess 0.6  0.0 - 2.0 mmol/L   O2 Saturation 94.7 %   Patient temperature 37.0    Collection site RIGHT RADIAL    Drawn by [id number]    Sample type ARTERIAL    Allens test (pass/fail) PASS PASS    Comment: Performed at [facility], [address] [place], [place] [address]    ABGS Recent Labs    09/20/17 0602  PHART 7.409  PO2ART 76.6*  HCO3 24.9   CULTURES Recent Results (from the past 240 hour(s))  Blood culture (routine x 2)     Status: None (Preliminary result)   Collection Time: 09/19/17  2:04 AM  Result Value Ref Range Status   Specimen Description BLOOD RIGHT HAND  Final   Special Requests   Final    BOTTLES DRAWN AEROBIC AND ANAEROBIC Blood Culture adequate volume   Culture   Final    NO GROWTH 1 DAY Performed at [facility], [address], [place], [place] [address]    Report Status PENDING  Incomplete  Blood culture (routine x 2)     Status: None (Preliminary result)   Collection Time: 09/19/17  2:06 AM  Result Value Ref Range Status   Specimen Description BLOOD LEFT HAND  Final   Special Requests   Final    BOTTLES DRAWN AEROBIC ONLY Blood Culture adequate volume   Culture   Final    NO GROWTH 1 DAY Performed at [facility], [address] [place], [place] [address]    Report Status PENDING  Incomplete  MRSA PCR Screening     Status: None   Collection Time: 09/19/17  3:50 AM  Result Value Ref Range Status   MRSA by PCR NEGATIVE NEGATIVE Final    Comment:        The GeneXpert MRSA Assay (FDA approved for  NASAL specimens only), is one component of a comprehensive MRSA colonization surveillance program. It is not intended to diagnose MRSA infection nor to guide or monitor treatment for MRSA infections. Performed at [facility], [address], [place], [place] [address]    Studies/Results: Ct Head Wo Contrast  Result Date: 09/19/2017 CLINICAL DATA:  57 year old male with seizure and encephalopathy. EXAM: CT HEAD WITHOUT CONTRAST TECHNIQUE: Contiguous axial images were obtained from the base of the skull through the vertex without intravenous contrast. COMPARISON:  Head CT dated 10/13/2016 FINDINGS: Brain: The ventricles and sulci appropriate size for patient's age. A focal area of white matter hypodensity in the right frontal lobe appears similar to prior CT. Old right cerebellar infarct noted. There is no acute intracranial hemorrhage. No mass effect or midline shift. No extra-axial fluid collection. Vascular: No hyperdense vessel or unexpected calcification. Skull: Normal. Negative for fracture or focal lesion. Sinuses/Orbits: There is mild mucoperiosteal thickening of paranasal sinuses and opacification of the nasal passages. Left nasal tube noted. Other: Partially visualized enteric and endotracheal tubes. IMPRESSION: 1. No acute intracranial pathology. 2. Stable focal right frontal periventricular white matter hypodensity and old right cerebellar infarct. Electronically Signed   By: Anner Crete M.D.   On: 09/19/2017 02:02   Dg Chest 1v Repeat Same Day  Result Date: 09/19/2017 CLINICAL DATA:  Respiratory failure, endotracheal tube placement EXAM: CHEST - 1 VIEW SAME DAY COMPARISON:  Portable chest x-ray of 09/19/2016 FINDINGS: The tip of the endotracheal tube appears to be approximately 4.3 cm above the carina. No active infiltrate or effusion is seen. Mediastinal and hilar contours are unchanged and mild cardiomegaly is stable. IMPRESSION: Tip of endotracheal  tube approximately 4.3 cm above  the carina. No active lung disease. Electronically Signed   By: Ivar Drape M.D.   On: 09/19/2017 09:52   Dg Chest Portable 1 View  Result Date: 09/19/2017 CLINICAL DATA:  57 year old male with intubation. EXAM: PORTABLE CHEST 1 VIEW COMPARISON:  Chest radiograph dated 03/18/2015 FINDINGS: An endotracheal tube is noted with tip approximately 3.5 cm above the carina. An enteric tube extends into the left hemiabdomen with side-port in the region of the gastroesophageal junction and tip in the proximal stomach. Recommend further advancing of the tube into the stomach by an additional at least 5 cm. There is mild eventration of the right hemidiaphragm. There is no focal consolidation, pleural effusion, or pneumothorax. Mild cardiomegaly. No acute osseous pathology. IMPRESSION: 1. Endotracheal tube above the carina. Enteric tube with side port at the GE junction. Recommend further advancing of the tube into the stomach. 2. No acute cardiopulmonary process. 3. Mild cardiomegaly. Electronically Signed   By: Anner Crete M.D.   On: 09/19/2017 00:56    Medications:  Prior to Admission:  Medications Prior to Admission  Medication Sig Dispense Refill Last Dose  . Eslicarbazepine Acetate (APTIOM) 400 MG TABS Take 1 tablet by mouth 2 (two) times daily.   unknown  . levETIRAcetam (KEPPRA) 750 MG tablet Take 1,500 mg by mouth 2 (two) times daily.   1 unknown  . amLODipine (NORVASC) 10 MG tablet Take 10 mg by mouth daily.  1 03/27/2017 at Unknown time  . aspirin 81 MG chewable tablet Chew 1 tablet (81 mg total) by mouth daily. 30 tablet 0 03/27/2017 at Unknown time  . hydrochlorothiazide (HYDRODIURIL) 25 MG tablet Take 1 tablet by mouth every morning.  1 03/27/2017 at Unknown time  . LORazepam (ATIVAN) 1 MG tablet Take 1 every 6 hours if needed for minor seizures or shaking or anxiety 15 tablet 0   . olmesartan (BENICAR) 20 MG tablet Take 20 mg by mouth daily.  0 03/27/2017 at Unknown time  . Omega-3 Fatty Acids  (FISH OIL) 1000 MG CAPS Take 1 capsule by mouth daily.    Not Taking at Unknown time  . phenytoin (DILANTIN) 100 MG ER capsule Take 2 capsules (200 mg total) by mouth 2 (two) times daily. (Patient not taking: Reported on 03/28/2017) 120 capsule 0 Not Taking at Unknown time  . potassium chloride (K-DUR) 10 MEQ tablet Take 1 tablet (10 mEq total) by mouth daily. 12 tablet 0   . tadalafil (CIALIS) 5 MG tablet Take 5 mg by mouth daily as needed for erectile dysfunction.   unknown   Scheduled: . chlorhexidine gluconate (MEDLINE KIT)  15 mL Mouth Rinse BID  . ipratropium-albuterol  3 mL Nebulization Q6H  . mouth rinse  15 mL Mouth Rinse 10 times per day   Continuous: . fentaNYL 10 mcg/ml infusion 150 mcg/hr (09/20/17 0233)  . lactated ringers 25 mL/hr at 09/20/17 0539  . levETIRAcetam Stopped (09/20/17 0232)  . pantoprozole (PROTONIX) infusion 8 mg/hr (09/20/17 0004)  . propofol (DIPRIVAN) infusion 45 mcg/kg/min (09/20/17 0444)  . valproate sodium Stopped (09/19/17 2130)   NWG:[id number] **OR** acetaminophen, fentaNYL, fentaNYL (SUBLIMAZE) injection, midazolam, ondansetron **OR** ondansetron (ZOFRAN) IV  Assesment: He was admitted with status epilepticus and was intubated in the emergency department.  He may have aspirated and his chest x-ray is certainly different than yesterday.  He has history of alcohol abuse and may be going through alcohol withdrawal but he is receiving Versed fairly regularly and is on  propofol.  He had lactic acidosis I think from his status epilepticus  He is had upper GI bleed he is on pantoprazole drip and blood work is pending this morning  He may have some element of COPD at baseline which is being treated.  He is had hypotension probably multifactorial. Principal Problem:   Status epilepticus ([place]) Active Problems:   History of hypertension   Hypokalemia   Alcohol abuse   Lactic acidosis   UGI bleed    Plan: Continue ventilator support.  With his  agitation I do not think we have much opportunity to get him off the ventilator.  Difficult situation with hypotension.  Although Precedex available I am not sure that it will keep him sedated enough to maintain his airway    LOS: 1 day   Sayda Grable L 09/20/2017, 7:53 AM

## 2017-09-20 NOTE — Progress Notes (Signed)
[place] A. [name], MD     [email or web address]          Reginald [name] is an 57 y.o. male.   Assessment/Plan: 1.  Resolved status epilepticus: I suspect this is most likely due to the abrupt cessation of antiseizure medications.  The patient actually has been restarted on Keppra although the dose is lower than he previously was on.  The dose will be increased back to his original dose. On Depakote and Keppra now.  Patient could be able to wean off ventilator from neurological standpoint.  2.  Multifactorial encephalopathy including status epilepticus and postictal encephalopathy along with medication effect. 3.  Alcoholism: It is possible that some of the seizures could be due to alcohol withdrawal but given his focal EEG findings and MRI, he will be maintained on antiseizure medications. 4.  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   Seems more responsive today. No Sz    GENERAL: He is intubated and sedated.  HEENT: No obvious trauma appreciated and neck is supple.  ABDOMEN: soft  EXTREMITIES: No edema   BACK: Normal  SKIN: Normal by inspection.    MENTAL STATUS: He opens and closes eyes opening to verbal commands.  CRANIAL NERVES: Pupils are equal, round and reactive to light; extra ocular movements are fine with oculocephalic reflexes,  upper and lower facial muscles are normal in strength and symmetric, there is no flattening of the nasolabial folds  MOTOR: Moves arms spontaneously and purposefully   COORDINATION: No clear obvious tremors or dysmetria.         Objective: Vital signs in last 24 hours: Temp:  [98.4 F (36.9 C)-99.4 F (37.4 C)] 98.9 F (37.2 C) (09/05 0400) Pulse Rate:  [59-91] 64 (09/05 0545) Resp:  [12-26] 16 (09/05 0545) BP: (78-122)/(47-97) 90/57 (09/05 0530) SpO2:  [84 %-100 %] 100 % (09/05 0545) FiO2 (%):  [40 %-60 %] 60 % (09/05 0419) Weight:  [122.6  kg] 122.6 kg (09/05 0400)  Intake/Output from previous day: 09/04 0701 - 09/05 0700 In: 3598.4 [I.V.:3348.4; IV Piggyback:250] Out: 1600 [Urine:1600] Intake/Output this shift: No intake/output data recorded. Nutritional status:  Diet Order            Diet NPO time specified  Diet effective now               Lab Results: Results for orders placed or performed during the hospital encounter of 09/19/17 (from the past 48 hour(s))  Basic metabolic panel     Status: Abnormal   Collection Time: 09/19/17  7:20 AM  Result Value Ref Range   Sodium 139 135 - 145 mmol/L   Potassium 4.4 3.5 - 5.1 mmol/L    Comment: DELTA CHECK NOTED   Chloride 108 98 - 111 mmol/L   CO2 24 22 - 32 mmol/L   Glucose, Bld 125 (H) 70 - 99 mg/dL   BUN 13 6 - 20 mg/dL   Creatinine, Ser 1.22 0.61 - 1.24 mg/dL   Calcium 8.2 (L) 8.9 - 10.3 mg/dL   GFR calc non Af Amer >60 >60 mL/min   GFR calc Af Amer >60 >60 mL/min    Comment: (NOTE) The eGFR has been calculated using the CKD EPI equation. This calculation has not been validated in all clinical situations. eGFR's persistently <60 mL/min signify possible Chronic Kidney Disease.    Anion gap 7 5 - 15    Comment: Performed at [facility], [address] [place],  [place] [address]  Type and screen [facility]     Status: None   Collection Time: 09/19/17  7:20 AM  Result Value Ref Range   ABO/RH(D) A POS    Antibody Screen NEG    Sample Expiration      09/22/2017 Performed at [facility], [address], [place], [place] [address]   Lactic acid, plasma     Status: None   Collection Time: 09/19/17  7:20 AM  Result Value Ref Range   Lactic Acid, Venous 1.3 0.5 - 1.9 mmol/L    Comment: Performed at [facility], [address], [place], [place] [address]  Glucose, capillary     Status: Abnormal   Collection Time: 09/19/17  7:39 AM  Result Value Ref Range   Glucose-Capillary 131 (H) 70 - 99 mg/dL  Hemoglobin and hematocrit, blood     Status:  None   Collection Time: 09/19/17  8:48 AM  Result Value Ref Range   Hemoglobin 13.3 13.0 - 17.0 g/dL   HCT 41.3 39.0 - 52.0 %    Comment: Performed at [facility], [address] [place], [place] [address]  Triglycerides     Status: Abnormal   Collection Time: 09/19/17  8:48 AM  Result Value Ref Range   Triglycerides 202 (H) <150 mg/dL    Comment: Performed at [facility], [address] [place], [place] [address]  Glucose, capillary     Status: Abnormal   Collection Time: 09/19/17 12:34 PM  Result Value Ref Range   Glucose-Capillary 137 (H) 70 - 99 mg/dL  Hemoglobin and hematocrit, blood     Status: Abnormal   Collection Time: 09/19/17  2:48 PM  Result Value Ref Range   Hemoglobin 12.5 (L) 13.0 - 17.0 g/dL   HCT 38.6 (L) 39.0 - 52.0 %    Comment: Performed at [facility], [address], [place], [place] [address]  Glucose, capillary     Status: Abnormal   Collection Time: 09/19/17  5:17 PM  Result Value Ref Range   Glucose-Capillary 111 (H) 70 - 99 mg/dL  CBC     Status: Abnormal   Collection Time: 09/19/17  7:00 PM  Result Value Ref Range   WBC 12.1 (H) 4.0 - 10.5 K/uL   RBC 4.68 4.22 - 5.81 MIL/uL   Hemoglobin 12.2 (L) 13.0 - 17.0 g/dL   HCT 37.8 (L) 39.0 - 52.0 %   MCV 80.8 78.0 - 100.0 fL   MCH 26.1 26.0 - 34.0 pg   MCHC 32.3 30.0 - 36.0 g/dL   RDW 15.8 (H) 11.5 - 15.5 %   Platelets 300 150 - 400 K/uL    Comment: Performed at [facility], [address], [place], [place] [address]  Glucose, capillary     Status: Abnormal   Collection Time: 09/19/17 11:54 PM  Result Value Ref Range   Glucose-Capillary 102 (H) 70 - 99 mg/dL  Blood gas, arterial     Status: Abnormal   Collection Time: 09/20/17  6:02 AM  Result Value Ref Range   FIO2 45.00    Delivery systems VENTILATOR    Mode PRESSURE REGULATED VOLUME CONTROL    VT 650 mL   LHR 16.0 resp/min   Peep/cpap 5.0 cm H20   pH, Arterial 7.409 7.350 - 7.450   pCO2 arterial 39.9 32.0 - 48.0 mmHg   pO2, Arterial 76.6  (L) 83.0 - 108.0 mmHg   Bicarbonate 24.9 20.0 - 28.0 mmol/L   Acid-Base Excess 0.6 0.0 -  2.0 mmol/L   O2 Saturation 94.7 %   Patient temperature 37.0    Collection site RIGHT RADIAL    Drawn by [id number]    Sample type ARTERIAL    Allens test (pass/fail) PASS PASS    Comment: Performed at [facility], [address], [place], [place] [address]  HIV antibody (Routine Testing)     Status: None   Collection Time: 09/20/17  9:38 AM  Result Value Ref Range   HIV Screen 4th Generation wRfx Non Reactive Non Reactive    Comment: (NOTE) Performed At: [facility] [place], [place] [id number] Rush Farmer MD [id number]   CBC WITH DIFFERENTIAL     Status: Abnormal   Collection Time: 09/20/17  9:38 AM  Result Value Ref Range   WBC 10.1 4.0 - 10.5 K/uL   RBC 4.60 4.22 - 5.81 MIL/uL   Hemoglobin 11.8 (L) 13.0 - 17.0 g/dL   HCT 37.6 (L) 39.0 - 52.0 %   MCV 81.7 78.0 - 100.0 fL   MCH 25.7 (L) 26.0 - 34.0 pg   MCHC 31.4 30.0 - 36.0 g/dL   RDW 16.3 (H) 11.5 - 15.5 %   Platelets 265 150 - 400 K/uL   Neutrophils Relative % 57 %   Neutro Abs 5.8 1.7 - 7.7 K/uL   Lymphocytes Relative 30 %   Lymphs Abs 3.0 0.7 - 4.0 K/uL   Monocytes Relative 12 %   Monocytes Absolute 1.2 (H) 0.1 - 1.0 K/uL   Eosinophils Relative 1 %   Eosinophils Absolute 0.1 0.0 - 0.7 K/uL   Basophils Relative 0 %   Basophils Absolute 0.0 0.0 - 0.1 K/uL    Comment: Performed at [facility], [address], [place], [place] [address]  Comprehensive metabolic panel     Status: Abnormal   Collection Time: 09/20/17  9:38 AM  Result Value Ref Range   Sodium 141 135 - 145 mmol/L   Potassium 3.4 (L) 3.5 - 5.1 mmol/L    Comment: DELTA CHECK NOTED   Chloride 109 98 - 111 mmol/L   CO2 28 22 - 32 mmol/L   Glucose, Bld 84 70 - 99 mg/dL   BUN 14 6 - 20 mg/dL   Creatinine, Ser 1.66 (H) 0.61 - 1.24 mg/dL   Calcium 8.0 (L) 8.9 - 10.3 mg/dL   Total Protein 5.9 (L) 6.5 - 8.1 g/dL   Albumin 3.1 (L) 3.5 - 5.0 g/dL    AST 43 (H) 15 - 41 U/L   ALT 30 0 - 44 U/L   Alkaline Phosphatase 54 38 - 126 U/L   Total Bilirubin 0.6 0.3 - 1.2 mg/dL   GFR calc non Af Amer 45 (L) >60 mL/min   GFR calc Af Amer 52 (L) >60 mL/min    Comment: (NOTE) The eGFR has been calculated using the CKD EPI equation. This calculation has not been validated in all clinical situations. eGFR's persistently <60 mL/min signify possible Chronic Kidney Disease.    Anion gap 4 (L) 5 - 15    Comment: Performed at [facility], [address], [place], [place] [address]  Glucose, capillary     Status: None   Collection Time: 09/20/17 11:19 AM  Result Value Ref Range   Glucose-Capillary 76 70 - 99 mg/dL  Urinalysis, Routine w reflex microscopic     Status: Abnormal   Collection Time: 09/20/17 11:00 PM  Result Value Ref Range   Color, Urine YELLOW YELLOW   APPearance HAZY (A)  CLEAR   Specific Gravity, Urine 1.016 1.005 - 1.030   pH 5.0 5.0 - 8.0   Glucose, UA NEGATIVE NEGATIVE mg/dL   Hgb urine dipstick SMALL (A) NEGATIVE   Bilirubin Urine NEGATIVE NEGATIVE   Ketones, ur NEGATIVE NEGATIVE mg/dL   Protein, ur NEGATIVE NEGATIVE mg/dL   Nitrite NEGATIVE NEGATIVE   Leukocytes, UA NEGATIVE NEGATIVE   RBC / HPF >50 (H) 0 - 5 RBC/hpf   WBC, UA 21-50 0 - 5 WBC/hpf   Bacteria, UA NONE SEEN NONE SEEN   Uric Acid Crys, UA PRESENT     Comment: Performed at [facility], [address], [place], [place] [address]  Glucose, capillary     Status: Abnormal   Collection Time: 09/21/17 12:18 AM  Result Value Ref Range   Glucose-Capillary 35 (LL) 70 - 99 mg/dL   Comment 1 Notify RN    Comment 2 Document in Chart   Glucose, capillary     Status: None   Collection Time: 09/21/17 12:20 AM  Result Value Ref Range   Glucose-Capillary 81 70 - 99 mg/dL   Comment 1 Notify RN    Comment 2 Document in Chart   Basic metabolic panel     Status: Abnormal   Collection Time: 09/21/17  4:25 AM  Result Value Ref Range   Sodium 142 135 - 145 mmol/L    Potassium 3.9 3.5 - 5.1 mmol/L   Chloride 111 98 - 111 mmol/L   CO2 22 22 - 32 mmol/L   Glucose, Bld 80 70 - 99 mg/dL   BUN 13 6 - 20 mg/dL   Creatinine, Ser 1.56 (H) 0.61 - 1.24 mg/dL   Calcium 8.0 (L) 8.9 - 10.3 mg/dL   GFR calc non Af Amer 48 (L) >60 mL/min   GFR calc Af Amer 56 (L) >60 mL/min    Comment: (NOTE) The eGFR has been calculated using the CKD EPI equation. This calculation has not been validated in all clinical situations. eGFR's persistently <60 mL/min signify possible Chronic Kidney Disease.    Anion gap 9 5 - 15    Comment: Performed at [facility], [address], [place], [place] [address]  Blood gas, arterial     Status: None   Collection Time: 09/21/17  4:40 AM  Result Value Ref Range   FIO2 60.00    Delivery systems VENTILATOR    Mode PRESSURE REGULATED VOLUME CONTROL    VT 650 mL   LHR 16 resp/min   Peep/cpap 5.0 cm H20   pH, Arterial 7.365 7.350 - 7.450   pCO2 arterial 43.0 32.0 - 48.0 mmHg   pO2, Arterial 84.4 83.0 - 108.0 mmHg   Bicarbonate 23.6 20.0 - 28.0 mmol/L   Acid-base deficit 0.7 0.0 - 2.0 mmol/L   O2 Saturation 95.5 %   Patient temperature 37.0    Collection site RIGHT RADIAL    Drawn by [id number]    Sample type ARTERIAL    Allens test (pass/fail) PASS PASS    Comment: Performed at [facility], [address] [place], [place] [address]  Glucose, capillary     Status: None   Collection Time: 09/21/17  6:12 AM  Result Value Ref Range   Glucose-Capillary 72 70 - 99 mg/dL   Comment 1 Notify RN    Comment 2 Document in Chart   Glucose, capillary     Status: Abnormal   Collection Time: 09/21/17  6:19 AM  Result Value Ref Range   Glucose-Capillary 67 (L)  70 - 99 mg/dL   Comment 1 Notify RN    Comment 2 Document in Chart   CBC with Differential/Platelet     Status: Abnormal   Collection Time: 09/21/17  6:20 AM  Result Value Ref Range   WBC 11.2 (H) 4.0 - 10.5 K/uL   RBC 4.40 4.22 - 5.81 MIL/uL   Hemoglobin 11.1 (L) 13.0 - 17.0 g/dL    HCT 36.4 (L) 39.0 - 52.0 %   MCV 82.7 78.0 - 100.0 fL   MCH 25.2 (L) 26.0 - 34.0 pg   MCHC 30.5 30.0 - 36.0 g/dL   RDW 16.4 (H) 11.5 - 15.5 %   Platelets 238 150 - 400 K/uL    Comment: Performed at [facility], [address], [place], [place] [address]   Neutrophils Relative %  %    THIS TEST WAS ORDERED IN ERROR AND HAS BEEN CREDITED.   Neutro Abs  1.7 - 7.7 K/uL    THIS TEST WAS ORDERED IN ERROR AND HAS BEEN CREDITED.   Band Neutrophils  %    THIS TEST WAS ORDERED IN ERROR AND HAS BEEN CREDITED.   Lymphocytes Relative  %    THIS TEST WAS ORDERED IN ERROR AND HAS BEEN CREDITED.   Lymphs Abs  0.7 - 4.0 K/uL    THIS TEST WAS ORDERED IN ERROR AND HAS BEEN CREDITED.   Monocytes Relative  %    THIS TEST WAS ORDERED IN ERROR AND HAS BEEN CREDITED.   Monocytes Absolute  0.1 - 1.0 K/uL    THIS TEST WAS ORDERED IN ERROR AND HAS BEEN CREDITED.   Eosinophils Relative  %    THIS TEST WAS ORDERED IN ERROR AND HAS BEEN CREDITED.   Eosinophils Absolute  0.0 - 0.7 K/uL    THIS TEST WAS ORDERED IN ERROR AND HAS BEEN CREDITED.   Basophils Relative  %    THIS TEST WAS ORDERED IN ERROR AND HAS BEEN CREDITED.   Basophils Absolute  0.0 - 0.1 K/uL    THIS TEST WAS ORDERED IN ERROR AND HAS BEEN CREDITED.   WBC Morphology      THIS TEST WAS ORDERED IN ERROR AND HAS BEEN CREDITED.   RBC Morphology      THIS TEST WAS ORDERED IN ERROR AND HAS BEEN CREDITED.   Smear Review      THIS TEST WAS ORDERED IN ERROR AND HAS BEEN CREDITED.   Other  %    THIS TEST WAS ORDERED IN ERROR AND HAS BEEN CREDITED.   nRBC  0 /100 WBC    THIS TEST WAS ORDERED IN ERROR AND HAS BEEN CREDITED.   Metamyelocytes Relative  %    THIS TEST WAS ORDERED IN ERROR AND HAS BEEN CREDITED.   Myelocytes  %    THIS TEST WAS ORDERED IN ERROR AND HAS BEEN CREDITED.   Promyelocytes Relative  %    THIS TEST WAS ORDERED IN ERROR AND HAS BEEN CREDITED.   Blasts  %    THIS TEST WAS ORDERED IN ERROR AND HAS BEEN CREDITED.    Lipid  Panel Recent Labs    09/19/17 0848  TRIG 202*    Studies/Results:   Medications:  Scheduled Meds: . chlorhexidine gluconate (MEDLINE KIT)  15 mL Mouth Rinse BID  . ipratropium-albuterol  3 mL Nebulization Q6H  . mouth rinse  15 mL Mouth Rinse 10 times per day   Continuous Infusions: . fentaNYL 10 mcg/ml infusion 250 mcg/hr (09/21/17 0619)  .  lactated ringers 25 mL/hr at 09/20/17 0539  . levETIRAcetam Stopped (09/21/17 0131)  . pantoprozole (PROTONIX) infusion 8 mg/hr (09/21/17 0012)  . propofol (DIPRIVAN) infusion 40 mcg/kg/min (09/20/17 2047)  . valproate sodium Stopped (09/20/17 2111)   PRN Meds:.acetaminophen **OR** acetaminophen, fentaNYL, fentaNYL (SUBLIMAZE) injection, midazolam, ondansetron **OR** ondansetron (ZOFRAN) IV     LOS: 2 days   Zaydrian Batta A. [name], M.D.  Diplomate, Tax adviser of Psychiatry and Neurology ( Neurology).

## 2017-09-20 NOTE — Progress Notes (Signed)
Night shift floor coverage note.  NS bolus 1 liter given for SBP in the 70s.  Lorazepam PRN was given for sedation after the patient became restless during blood draw. Versed 2 mg every 1 hour was ordered instead of lorazepam for sedation. IVF made [facility] after the patient showed edema, particularly of upper extremities. The patient is over 1600 mL positive on intake.  Sanda Klein, MD

## 2017-09-20 NOTE — Progress Notes (Signed)
Dr. Robb Matar in room to see pt after being made aware that pts BP is still in the 70's systolic after bolus. No new orders for pressors at this time, but pts arms are starting to have non-pitting edema in them. urine output this shift.  Will continue to monitor pts Bp at this time. If BP starts to decrease more, page MD.

## 2017-09-20 NOTE — Progress Notes (Addendum)
PROGRESS NOTE    Reginald [name]  OVF:[id number] DOB: 1960/06/23 DOA: 09/19/2017 PCP: Antony Contras, MD     Brief Narrative:  57 y.o. male with medical history significant of hypertension, seizure disorder alcohol abuse who was brought to the emergency department via EMS with status epilepticus.  He was intubated for airway protection.  Apparently, the patient uses cannabis for his seizures, but his wife stated to the ED nursing staff that he has not used that in the past couple days and may be the reason why he is having seizures.  After the NG tube was inserted, he draining a substantial amount of blood, which raises the possibility of UGI.  I doubt that the patient bleed down much from tongue bite.  There is no further history is available.  Assessment & Plan: 1-status epilepticus ([place]) -Intubated for airway protection -Started on IV Keppra and also Depakote -Neurology on board will follow recommendations -EEG demonstrated some focal activity  2-acute respiratory failure -As mentioned above in the setting of a status epilepticus patient mechanically ventilated and intubated for airway protection. -Appreciate pulmonology service assistance and recommendation -Continue weaning sedation and hopefully will be able to be extubated soon.  3-History of hypertension -Currently hypotensive  -most likely from sedation -IVF's given -will follow VS  4-Hypokalemia -Most likely associated with alcohol abuse -Follow electrolytes and replete and as needed.  5-Alcohol abuse -Minimize as per wife history -Currently receiving sedation -Will follow clinical response   6-Lactic acidosis -IVF's given  7-UGI bleed -maybe gastritis -GI consulted -Hgb stable -continue PPI  8-AKI -in the setting of decrease perfusion with low BP and most likely mild rhabdomyolysis with seizure activity. -maintain BP -minimize nephrotoxic agents -follow renal function trend  -will check UA and renal US.    DVT prophylaxis: SCD's Code Status: Full code Family Communication: Wife at bedside Disposition Plan: Remains inpatient in the ICU; continue mechanical ventilator, continue weaning sedation.  Continue IV antiepileptic drugs and follow clinical response.  Appreciate pulmonology and neurology service recommendation.  Consultants:   Neurology  Pulmonology  GI  Procedures:   Mechanically ventilated.  -See below for x-ray reports.  Antimicrobials:  Anti-infectives (From admission, onward)   None       Subjective: Afebrile, sedated, intubated and mechanically ventilated.  Night with some agitation and restlessness requiring extra Ativan and increase in his sedation.  Objective: Vitals:   09/20/17 1600 09/20/17 1611 09/20/17 1700 09/20/17 1800  BP: 113/75     Pulse: 91 85  71  Resp: 16 18 (!) 26 16  Temp:  99 F (37.2 C)    TempSrc:  Axillary    SpO2: (!) 85% (!) 84%  100%  Weight:      Height:        Intake/Output Summary (Last 24 hours) at 09/20/2017 1924 Last data filed at 09/20/2017 1800 Gross per 24 hour  Intake 6448.34 ml  Output 1450 ml  Net 4998.34 ml   Filed Weights   09/19/17 0409 09/19/17 0500 09/20/17 0500  Weight: 116.1 kg 116.1 kg 120.5 kg    Examination: General exam: Afebrile, sedated, intubated and mechanically ventilated. Respiratory system: Good air movement bilaterally, scattered rhonchi, no wheezing, no crackles.  Mechanically ventilated. Cardiovascular system: Regular rate, no rubs, no gallops, no murmurs. Gastrointestinal system: Abdomen is nondistended, soft and nontender. No organomegaly or masses felt. Normal bowel sounds heard. Central nervous system: No focal neurological deficits appreciated, but patient is sedated and mechanically ventilated at this time.. Extremities: Trace edema  bilaterally (upper extremities more than lower extremities), no cyanosis, no clubbing. Skin: No rashes, lesions or ulcers Psychiatry: Unable to assess as  the patient is currently sedated, intubated and mechanically ventilated.   Data Reviewed: I have personally reviewed following labs and imaging studies  CBC: Recent Labs  Lab 09/19/17 0100 09/19/17 0252 09/19/17 0848 09/19/17 1448 09/19/17 1900 09/20/17 0938  WBC 14.4*  --   --   --  12.1* 10.1  NEUTROABS 7.3  --   --   --   --  5.8  HGB 15.4 14.3 13.3 12.5* 12.2* 11.8*  HCT 48.3 43.5 41.3 38.6* 37.8* 37.6*  MCV 83.6  --   --   --  80.8 81.7  PLT 400  --   --   --  300 315   Basic Metabolic Panel: Recent Labs  Lab 09/19/17 0100 09/19/17 0252 09/19/17 0720 09/20/17 0938  NA 140  --  139 141  K 3.2*  --  4.4 3.4*  CL 102  --  108 109  CO2 14*  --  24 28  GLUCOSE 234*  --  125* 84  BUN 12  --  13 14  CREATININE 1.34*  --  1.22 1.66*  CALCIUM 9.2  --  8.2* 8.0*  MG  --  2.5*  --   --   PHOS  --  2.6  --   --    GFR: Estimated Creatinine Clearance: 69.3 mL/min (A) (by C-G formula based on SCr of 1.66 mg/dL (H)).   Liver Function Tests: Recent Labs  Lab 09/19/17 0100 09/20/17 0938  AST 47* 43*  ALT 38 30  ALKPHOS 77 54  BILITOT 0.7 0.6  PROT 8.8* 5.9*  ALBUMIN 4.5 3.1*   Coagulation Profile: Recent Labs  Lab 09/19/17 0100  INR 1.14   Cardiac Enzymes: Recent Labs  Lab 09/19/17 0100  TROPONINI <0.03   CBG: Recent Labs  Lab 09/19/17 0739 09/19/17 1234 09/19/17 1717 09/19/17 2354 09/20/17 1119  GLUCAP 131* 137* 111* 102* 76   Lipid Profile: Recent Labs    09/19/17 0524 09/19/17 0848  TRIG 242* 202*   Urine analysis:    Component Value Date/Time   COLORURINE YELLOW 09/19/2017 0120   APPEARANCEUR CLEAR 09/19/2017 0120   LABSPEC 1.014 09/19/2017 0120   PHURINE 5.0 09/19/2017 0120   GLUCOSEU NEGATIVE 09/19/2017 0120   HGBUR MODERATE (A) 09/19/2017 0120   BILIRUBINUR NEGATIVE 09/19/2017 0120   KETONESUR NEGATIVE 09/19/2017 0120   PROTEINUR 100 (A) 09/19/2017 0120   NITRITE NEGATIVE 09/19/2017 0120   LEUKOCYTESUR NEGATIVE 09/19/2017 0120     Recent Results (from the past 240 hour(s))  Urine Culture     Status: None   Collection Time: 09/19/17  1:20 AM  Result Value Ref Range Status   Specimen Description   Final    URINE, CATHETERIZED Performed at [facility], [address] [place], [place] [address]    Special Requests   Final    NONE Performed at [facility], [address], [place], [place] [address]    Culture   Final    NO GROWTH Performed at [facility], [place] [address], [place], [place] [address]    Report Status 09/20/2017 FINAL  Final  Blood culture (routine x 2)     Status: None (Preliminary result)   Collection Time: 09/19/17  2:04 AM  Result Value Ref Range Status   Specimen Description BLOOD RIGHT HAND  Final   Special Requests  Final    BOTTLES DRAWN AEROBIC AND ANAEROBIC Blood Culture adequate volume   Culture   Final    NO GROWTH 1 DAY Performed at [facility], [address] [place], [place] [address]    Report Status PENDING  Incomplete  Blood culture (routine x 2)     Status: None (Preliminary result)   Collection Time: 09/19/17  2:06 AM  Result Value Ref Range Status   Specimen Description BLOOD LEFT HAND  Final   Special Requests   Final    BOTTLES DRAWN AEROBIC ONLY Blood Culture adequate volume   Culture   Final    NO GROWTH 1 DAY Performed at [facility], [address], [place], [place] [address]    Report Status PENDING  Incomplete  MRSA PCR Screening     Status: None   Collection Time: 09/19/17  3:50 AM  Result Value Ref Range Status   MRSA by PCR NEGATIVE NEGATIVE Final    Comment:        The GeneXpert MRSA Assay (FDA approved for NASAL specimens only), is one component of a comprehensive MRSA colonization surveillance program. It is not intended to diagnose MRSA infection nor to guide or monitor treatment for MRSA infections. Performed at [facility], [address], [place], [place] [address]      Radiology Studies: Ct Head Wo Contrast  Result Date:  09/19/2017 CLINICAL DATA:  57 year old male with seizure and encephalopathy. EXAM: CT HEAD WITHOUT CONTRAST TECHNIQUE: Contiguous axial images were obtained from the base of the skull through the vertex without intravenous contrast. COMPARISON:  Head CT dated 10/13/2016 FINDINGS: Brain: The ventricles and sulci appropriate size for patient's age. A focal area of white matter hypodensity in the right frontal lobe appears similar to prior CT. Old right cerebellar infarct noted. There is no acute intracranial hemorrhage. No mass effect or midline shift. No extra-axial fluid collection. Vascular: No hyperdense vessel or unexpected calcification. Skull: Normal. Negative for fracture or focal lesion. Sinuses/Orbits: There is mild mucoperiosteal thickening of paranasal sinuses and opacification of the nasal passages. Left nasal tube noted. Other: Partially visualized enteric and endotracheal tubes. IMPRESSION: 1. No acute intracranial pathology. 2. Stable focal right frontal periventricular white matter hypodensity and old right cerebellar infarct. Electronically Signed   By: Anner Crete M.D.   On: 09/19/2017 02:02   Dg Chest 1v Repeat Same Day  Result Date: 09/19/2017 CLINICAL DATA:  Respiratory failure, endotracheal tube placement EXAM: CHEST - 1 VIEW SAME DAY COMPARISON:  Portable chest x-ray of 09/19/2016 FINDINGS: The tip of the endotracheal tube appears to be approximately 4.3 cm above the carina. No active infiltrate or effusion is seen. Mediastinal and hilar contours are unchanged and mild cardiomegaly is stable. IMPRESSION: Tip of endotracheal tube approximately 4.3 cm above the carina. No active lung disease. Electronically Signed   By: Ivar Drape M.D.   On: 09/19/2017 09:52   Portable Chest Xray  Result Date: 09/20/2017 CLINICAL DATA:  Respiratory failure. EXAM: PORTABLE CHEST 1 VIEW COMPARISON:  09/19/2017 FINDINGS: Endotracheal tube is 4.8 cm above the carina. Nasogastric tube extends into the left  upper abdomen. There are new densities at the left lung base. Heart size is within normal limits and stable. Negative for a pneumothorax. IMPRESSION: New left basilar densities. Findings could represent volume loss but cannot exclude pneumonia or aspiration. Endotracheal tube is appropriately positioned. Electronically Signed   By: Markus Daft M.D.   On: 09/20/2017 10:21   Dg Chest Portable 1  View  Result Date: 09/19/2017 CLINICAL DATA:  57 year old male with intubation. EXAM: PORTABLE CHEST 1 VIEW COMPARISON:  Chest radiograph dated 03/18/2015 FINDINGS: An endotracheal tube is noted with tip approximately 3.5 cm above the carina. An enteric tube extends into the left hemiabdomen with side-port in the region of the gastroesophageal junction and tip in the proximal stomach. Recommend further advancing of the tube into the stomach by an additional at least 5 cm. There is mild eventration of the right hemidiaphragm. There is no focal consolidation, pleural effusion, or pneumothorax. Mild cardiomegaly. No acute osseous pathology. IMPRESSION: 1. Endotracheal tube above the carina. Enteric tube with side port at the GE junction. Recommend further advancing of the tube into the stomach. 2. No acute cardiopulmonary process. 3. Mild cardiomegaly. Electronically Signed   By: Anner Crete M.D.   On: 09/19/2017 00:56    Scheduled Meds: . chlorhexidine gluconate (MEDLINE KIT)  15 mL Mouth Rinse BID  . ipratropium-albuterol  3 mL Nebulization Q6H  . mouth rinse  15 mL Mouth Rinse 10 times per day   Continuous Infusions: . fentaNYL 10 mcg/ml infusion 150 mcg/hr (09/20/17 0856)  . lactated ringers 25 mL/hr at 09/20/17 0539  . levETIRAcetam Stopped (09/20/17 1521)  . pantoprozole (PROTONIX) infusion 8 mg/hr (09/20/17 1559)  . propofol (DIPRIVAN) infusion 45 mcg/kg/min (09/20/17 0853)  . valproate sodium Stopped (09/20/17 1102)     LOS: 1 day    Time spent: 35 minutes. Greater than 50% of this time was spent  in direct contact with the patient, coordinating care and discussing relevant ongoing clinical issues at bedside with family member.  Discussion with pulmonologist and also with nursing staff regarding plan of care and further approach.  Patient remained critically ill and is at this moment intubated and mechanically ventilated.    Barton Dubois, MD [facility] Pager [phone]  If 7PM-7AM, please contact night-coverage [email or web address] Password [facility] 09/20/2017, 7:24 PM

## 2017-09-20 NOTE — Progress Notes (Signed)
Dr. Robb Matar paged about pts BP running in 60's and 70s. Pt is maxed out on Profofol at and fentanyl is at 19mcg/hr. When trying to decrease sedation-pt wakes up and becomes agitated. Anytime mouth care is done, pt becomes agitated and tries pulling. Waiting for orders/call back. Will continue to monitor pt.

## 2017-09-21 ENCOUNTER — Inpatient Hospital Stay (HOSPITAL_COMMUNITY): Payer: No Typology Code available for payment source

## 2017-09-21 DIAGNOSIS — Z4659 Encounter for fitting and adjustment of other gastrointestinal appliance and device: Secondary | ICD-10-CM

## 2017-09-21 LAB — BASIC METABOLIC PANEL
ANION GAP: 9 (ref 5–15)
BUN: 13 mg/dL (ref 6–20)
CHLORIDE: 111 mmol/L (ref 98–111)
CO2: 22 mmol/L (ref 22–32)
Calcium: 8 mg/dL — ABNORMAL LOW (ref 8.9–10.3)
Creatinine, Ser: 1.56 mg/dL — ABNORMAL HIGH (ref 0.61–1.24)
GFR calc Af Amer: 56 mL/min — ABNORMAL LOW (ref >60)
GFR calc non Af Amer: 48 mL/min — ABNORMAL LOW (ref >60)
Glucose, Bld: 80 mg/dL (ref 70–99)
Potassium: 3.9 mmol/L (ref 3.5–5.1)
Sodium: 142 mmol/L (ref 135–145)

## 2017-09-21 LAB — GLUCOSE, CAPILLARY
Glucose-Capillary: 117 mg/dL — ABNORMAL HIGH (ref 70–99)
Glucose-Capillary: 67 mg/dL — ABNORMAL LOW (ref 70–99)
Glucose-Capillary: 72 mg/dL (ref 70–99)
Glucose-Capillary: 81 mg/dL (ref 70–99)
Glucose-Capillary: 87 mg/dL (ref 70–99)
Glucose-Capillary: 91 mg/dL (ref 70–99)

## 2017-09-21 LAB — BLOOD GAS, ARTERIAL
Acid-base deficit: 0.7 mmol/L (ref 0.0–2.0)
BICARBONATE: 23.6 mmol/L (ref 20.0–28.0)
Drawn by: 21310
FIO2: 60
O2 SAT: 95.5 %
PEEP: 5 cmH2O
PH ART: 7.365 (ref 7.350–7.450)
Patient temperature: 37
RATE: 16 resp/min
VT: 650 mL
pCO2 arterial: 43 mmHg (ref 32.0–48.0)
pO2, Arterial: 84.4 mmHg (ref 83.0–108.0)

## 2017-09-21 LAB — CBC WITH DIFFERENTIAL/PLATELET
HCT: 36.4 % — ABNORMAL LOW (ref 39.0–52.0)
Hemoglobin: 11.1 g/dL — ABNORMAL LOW (ref 13.0–17.0)
MCH: 25.2 pg — AB (ref 26.0–34.0)
MCHC: 30.5 g/dL (ref 30.0–36.0)
MCV: 82.7 fL (ref 78.0–100.0)
Platelets: 238 10*3/uL (ref 150–400)
RBC: 4.4 MIL/uL (ref 4.22–5.81)
RDW: 16.4 % — ABNORMAL HIGH (ref 11.5–15.5)
WBC: 11.2 10*3/uL — ABNORMAL HIGH (ref 4.0–10.5)

## 2017-09-21 LAB — PHOSPHORUS
Phosphorus: 4.7 mg/dL — ABNORMAL HIGH (ref 2.5–4.6)
Phosphorus: 4.4 mg/dL (ref 2.5–4.6)

## 2017-09-21 LAB — MAGNESIUM
Magnesium: 2.5 mg/dL — ABNORMAL HIGH (ref 1.7–2.4)
Magnesium: 2.3 mg/dL (ref 1.7–2.4)

## 2017-09-21 LAB — HIV ANTIBODY (ROUTINE TESTING W REFLEX): HIV SCREEN 4TH GENERATION: NONREACTIVE

## 2017-09-21 MED ORDER — SODIUM CHLORIDE 0.9 % IV SOLN
0.0000 mg/h | INTRAVENOUS | Status: DC
  Administered 2017-09-21 – 2017-09-23 (×8): 10 mg/h via INTRAVENOUS
  Filled 2017-09-21 (×5): qty 10

## 2017-09-21 MED ORDER — PRO-STAT SUGAR FREE PO LIQD
60.0000 mL | Freq: Four times a day (QID) | ORAL | Status: DC
  Administered 2017-09-21 – 2017-09-24 (×6): 60 mL
  Filled 2017-09-21 (×5): qty 60

## 2017-09-21 MED ORDER — VITAL HIGH PROTEIN PO LIQD
1000.0000 mL | ORAL | Status: DC
  Administered 2017-09-21: 1000 mL
  Filled 2017-09-21 (×2): qty 1000

## 2017-09-21 MED ORDER — VITAL HIGH PROTEIN PO LIQD
1000.0000 mL | ORAL | Status: DC
  Filled 2017-09-21 (×4): qty 1000

## 2017-09-21 MED ORDER — SODIUM CHLORIDE 0.9 % IV SOLN
0.0000 ug/h | INTRAVENOUS | Status: DC
  Administered 2017-09-21: 300 ug/h via INTRAVENOUS
  Administered 2017-09-22 (×2): 350 ug/h via INTRAVENOUS
  Administered 2017-09-22 – 2017-09-23 (×4): 400 ug/h via INTRAVENOUS
  Filled 2017-09-21 (×4): qty 50

## 2017-09-21 MED ORDER — SODIUM CHLORIDE 0.9 % IV SOLN
1.0000 mg/h | INTRAVENOUS | Status: DC
  Administered 2017-09-21: 1 mg/h via INTRAVENOUS
  Filled 2017-09-21: qty 10

## 2017-09-21 MED ORDER — METRONIDAZOLE IN NACL 5-0.79 MG/ML-% IV SOLN
500.0000 mg | Freq: Four times a day (QID) | INTRAVENOUS | Status: DC
  Administered 2017-09-21 – 2017-09-24 (×13): 500 mg via INTRAVENOUS
  Filled 2017-09-21 (×13): qty 100

## 2017-09-21 MED ORDER — MIDAZOLAM HCL 2 MG/2ML IJ SOLN: 2.0000 mg | INTRAMUSCULAR | Status: DC | PRN

## 2017-09-21 MED ORDER — MIDAZOLAM HCL 2 MG/2ML IJ SOLN
2.0000 mg | INTRAMUSCULAR | Status: AC | PRN
  Administered 2017-09-22 – 2017-09-23 (×3): 2 mg via INTRAVENOUS
  Filled 2017-09-21: qty 2

## 2017-09-21 MED ORDER — FUROSEMIDE 10 MG/ML IJ SOLN
20.0000 mg | Freq: Two times a day (BID) | INTRAMUSCULAR | Status: AC
  Administered 2017-09-21 – 2017-09-23 (×4): 20 mg via INTRAVENOUS
  Filled 2017-09-21 (×4): qty 2

## 2017-09-21 MED ORDER — FENTANYL CITRATE (PF) 2500 MCG/50ML IJ SOLN
INTRAMUSCULAR | Status: AC
  Filled 2017-09-21: qty 50

## 2017-09-21 MED ORDER — PRO-STAT SUGAR FREE PO LIQD
30.0000 mL | Freq: Two times a day (BID) | ORAL | Status: DC
  Administered 2017-09-21: 30 mL
  Filled 2017-09-21: qty 30

## 2017-09-21 MED ORDER — PANTOPRAZOLE SODIUM 40 MG IV SOLR
40.0000 mg | Freq: Two times a day (BID) | INTRAVENOUS | Status: DC
  Administered 2017-09-21 – 2017-09-24 (×6): 40 mg via INTRAVENOUS
  Filled 2017-09-21 (×6): qty 40

## 2017-09-21 MED ORDER — MIDAZOLAM BOLUS VIA INFUSION
1.0000 mg | INTRAVENOUS | Status: DC | PRN
  Administered 2017-09-21 – 2017-09-23 (×14): 2 mg via INTRAVENOUS
  Filled 2017-09-21: qty 2

## 2017-09-21 MED ORDER — FENTANYL 2500MCG IN NS 250ML (10MCG/ML) PREMIX INFUSION: 25.0000 ug/h | INTRAVENOUS | Status: DC

## 2017-09-21 MED ORDER — METHYLPREDNISOLONE SODIUM SUCC 40 MG IJ SOLR
40.0000 mg | Freq: Two times a day (BID) | INTRAMUSCULAR | Status: DC
  Administered 2017-09-21 – 2017-09-24 (×7): 40 mg via INTRAVENOUS
  Filled 2017-09-21 (×7): qty 1

## 2017-09-21 NOTE — Progress Notes (Signed)
Reginald A. [name], MD     [email or web address]          Reginald [name] is an 57 y.o. male.   Assessment/Plan: 1.  Resolved status epilepticus: I suspect this is most likely due to the abrupt cessation of antiseizure medications.  On Depakote and Keppra now.   2.  Multifactorial encephalopathy including status epilepticus and postictal encephalopathy along with medication effect. 3.  Alcoholism: It is possible that some of the seizures could be due to alcohol withdrawal but given his focal EEG findings and MRI, he will be maintained on antiseizure medications. 4.  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   No Sz.  Attempted to wean off of oxygen FiO2 resulted in desaturations.  Weaning has been held for today it appears.    GENERAL: He is intubated and sedated.  HEENT: No obvious trauma appreciated and neck is supple.  ABDOMEN: soft  EXTREMITIES: No edema   BACK: Normal  SKIN: Normal by inspection.    MENTAL STATUS: He opens and closes eyes opening to verbal commands.  CRANIAL NERVES: Pupils are equal, round and reactive to light; extra ocular movements are fine with oculocephalic reflexes,  upper and lower facial muscles are normal in strength and symmetric, there is no flattening of the nasolabial folds  MOTOR: Moves arms spontaneously and purposefully   COORDINATION: No clear obvious tremors or dysmetria.         Objective: Vital signs in last 24 hours: Temp:  [98.8 F (37.1 C)-99.4 F (37.4 C)] 99.4 F (37.4 C) (09/05 1548) Pulse Rate:  [59-107] 76 (09/05 1800) Resp:  [14-22] 16 (09/05 1800) BP: (78-153)/(47-89) 114/68 (09/05 1800) SpO2:  [92 %-100 %] 94 % (09/05 1800) FiO2 (%):  [55 %-60 %] 55 % (09/05 1502) Weight:  [122.6 kg] 122.6 kg (09/05 0400)  Intake/Output from previous day: 09/04 0701 - 09/05 0700 In: 3598.4 [I.V.:3348.4; IV Piggyback:250] Out: 1600  [Urine:1600] Intake/Output this shift: Total I/O In: 1400.7 [I.V.:865.6; NG/GT:110; IV Piggyback:425.1] Out: 2350 [Urine:2350] Nutritional status:  Diet Order            Diet NPO time specified  Diet effective now               Lab Results: Results for orders placed or performed during the hospital encounter of 09/19/17 (from the past 48 hour(s))  CBC     Status: Abnormal   Collection Time: 09/19/17  7:00 PM  Result Value Ref Range   WBC 12.1 (H) 4.0 - 10.5 K/uL   RBC 4.68 4.22 - 5.81 MIL/uL   Hemoglobin 12.2 (L) 13.0 - 17.0 g/dL   HCT 37.8 (L) 39.0 - 52.0 %   MCV 80.8 78.0 - 100.0 fL   MCH 26.1 26.0 - 34.0 pg   MCHC 32.3 30.0 - 36.0 g/dL   RDW 15.8 (H) 11.5 - 15.5 %   Platelets 300 150 - 400 K/uL    Comment: Performed at [facility], [address] [place], [place] [address]  Glucose, capillary     Status: Abnormal   Collection Time: 09/19/17 11:54 PM  Result Value Ref Range   Glucose-Capillary 102 (H) 70 - 99 mg/dL  Blood gas, arterial     Status: Abnormal   Collection Time: 09/20/17  6:02 AM  Result Value Ref Range   FIO2 45.00    Delivery systems VENTILATOR    Mode PRESSURE REGULATED VOLUME CONTROL    VT 650 mL  LHR 16.0 resp/min   Peep/cpap 5.0 cm H20   pH, Arterial 7.409 7.350 - 7.450   pCO2 arterial 39.9 32.0 - 48.0 mmHg   pO2, Arterial 76.6 (L) 83.0 - 108.0 mmHg   Bicarbonate 24.9 20.0 - 28.0 mmol/L   Acid-Base Excess 0.6 0.0 - 2.0 mmol/L   O2 Saturation 94.7 %   Patient temperature 37.0    Collection site RIGHT RADIAL    Drawn by [id number]    Sample type ARTERIAL    Allens test (pass/fail) PASS PASS    Comment: Performed at [facility], [address], [place], [place] [address]  HIV antibody (Routine Testing)     Status: None   Collection Time: 09/20/17  9:38 AM  Result Value Ref Range   HIV Screen 4th Generation wRfx Non Reactive Non Reactive    Comment: (NOTE) Performed At: [facility] [place], [place] [id number] Rush Farmer MD QA:[id number]   CBC WITH DIFFERENTIAL     Status: Abnormal   Collection Time: 09/20/17  9:38 AM  Result Value Ref Range   WBC 10.1 4.0 - 10.5 K/uL   RBC 4.60 4.22 - 5.81 MIL/uL   Hemoglobin 11.8 (L) 13.0 - 17.0 g/dL   HCT 37.6 (L) 39.0 - 52.0 %   MCV 81.7 78.0 - 100.0 fL   MCH 25.7 (L) 26.0 - 34.0 pg   MCHC 31.4 30.0 - 36.0 g/dL   RDW 16.3 (H) 11.5 - 15.5 %   Platelets 265 150 - 400 K/uL   Neutrophils Relative % 57 %   Neutro Abs 5.8 1.7 - 7.7 K/uL   Lymphocytes Relative 30 %   Lymphs Abs 3.0 0.7 - 4.0 K/uL   Monocytes Relative 12 %   Monocytes Absolute 1.2 (H) 0.1 - 1.0 K/uL   Eosinophils Relative 1 %   Eosinophils Absolute 0.1 0.0 - 0.7 K/uL   Basophils Relative 0 %   Basophils Absolute 0.0 0.0 - 0.1 K/uL    Comment: Performed at [facility], [address], [place], [place] [address]  Comprehensive metabolic panel     Status: Abnormal   Collection Time: 09/20/17  9:38 AM  Result Value Ref Range   Sodium 141 135 - 145 mmol/L   Potassium 3.4 (L) 3.5 - 5.1 mmol/L    Comment: DELTA CHECK NOTED   Chloride 109 98 - 111 mmol/L   CO2 28 22 - 32 mmol/L   Glucose, Bld 84 70 - 99 mg/dL   BUN 14 6 - 20 mg/dL   Creatinine, Ser 1.66 (H) 0.61 - 1.24 mg/dL   Calcium 8.0 (L) 8.9 - 10.3 mg/dL   Total Protein 5.9 (L) 6.5 - 8.1 g/dL   Albumin 3.1 (L) 3.5 - 5.0 g/dL   AST 43 (H) 15 - 41 U/L   ALT 30 0 - 44 U/L   Alkaline Phosphatase 54 38 - 126 U/L   Total Bilirubin 0.6 0.3 - 1.2 mg/dL   GFR calc non Af Amer 45 (L) >60 mL/min   GFR calc Af Amer 52 (L) >60 mL/min    Comment: (NOTE) The eGFR has been calculated using the CKD EPI equation. This calculation has not been validated in all clinical situations. eGFR's persistently <60 mL/min signify possible Chronic Kidney Disease.    Anion gap 4 (L) 5 - 15    Comment: Performed at [facility], [address] [place], [place] [address]  Glucose, capillary     Status: None   Collection Time:  09/20/17 11:19 AM  Result Value Ref  Range   Glucose-Capillary 76 70 - 99 mg/dL  Urinalysis, Routine w reflex microscopic     Status: Abnormal   Collection Time: 09/20/17 11:00 PM  Result Value Ref Range   Color, Urine YELLOW YELLOW   APPearance HAZY (A) CLEAR   Specific Gravity, Urine 1.016 1.005 - 1.030   pH 5.0 5.0 - 8.0   Glucose, UA NEGATIVE NEGATIVE mg/dL   Hgb urine dipstick SMALL (A) NEGATIVE   Bilirubin Urine NEGATIVE NEGATIVE   Ketones, ur NEGATIVE NEGATIVE mg/dL   Protein, ur NEGATIVE NEGATIVE mg/dL   Nitrite NEGATIVE NEGATIVE   Leukocytes, UA NEGATIVE NEGATIVE   RBC / HPF >50 (H) 0 - 5 RBC/hpf   WBC, UA 21-50 0 - 5 WBC/hpf   Bacteria, UA NONE SEEN NONE SEEN   Uric Acid Crys, UA PRESENT     Comment: Performed at [facility], [address], [place], [place] [address]  Glucose, capillary     Status: Abnormal   Collection Time: 09/21/17 12:18 AM  Result Value Ref Range   Glucose-Capillary 35 (LL) 70 - 99 mg/dL   Comment 1 Notify RN    Comment 2 Document in Chart   Glucose, capillary     Status: None   Collection Time: 09/21/17 12:20 AM  Result Value Ref Range   Glucose-Capillary 81 70 - 99 mg/dL   Comment 1 Notify RN    Comment 2 Document in Chart   Magnesium     Status: Abnormal   Collection Time: 09/21/17  4:24 AM  Result Value Ref Range   Magnesium 2.5 (H) 1.7 - 2.4 mg/dL    Comment: Performed at [facility], [address], [place], [place] [address]  Phosphorus     Status: Abnormal   Collection Time: 09/21/17  4:24 AM  Result Value Ref Range   Phosphorus 4.7 (H) 2.5 - 4.6 mg/dL    Comment: Performed at [facility], [address], [place], [place] [address]  Basic metabolic panel     Status: Abnormal   Collection Time: 09/21/17  4:25 AM  Result Value Ref Range   Sodium 142 135 - 145 mmol/L   Potassium 3.9 3.5 - 5.1 mmol/L   Chloride 111 98 - 111 mmol/L   CO2 22 22 - 32 mmol/L   Glucose, Bld 80 70 - 99 mg/dL   BUN 13 6 - 20 mg/dL   Creatinine, Ser 1.56 (H) 0.61 - 1.24 mg/dL   Calcium 8.0  (L) 8.9 - 10.3 mg/dL   GFR calc non Af Amer 48 (L) >60 mL/min   GFR calc Af Amer 56 (L) >60 mL/min    Comment: (NOTE) The eGFR has been calculated using the CKD EPI equation. This calculation has not been validated in all clinical situations. eGFR's persistently <60 mL/min signify possible Chronic Kidney Disease.    Anion gap 9 5 - 15    Comment: Performed at [facility], [address], [place], [place] [address]  Blood gas, arterial     Status: None   Collection Time: 09/21/17  4:40 AM  Result Value Ref Range   FIO2 60.00    Delivery systems VENTILATOR    Mode PRESSURE REGULATED VOLUME CONTROL    VT 650 mL   LHR 16 resp/min   Peep/cpap 5.0 cm H20   pH, Arterial 7.365 7.350 - 7.450   pCO2 arterial 43.0 32.0 - 48.0 mmHg   pO2, Arterial 84.4 83.0 - 108.0 mmHg  Bicarbonate 23.6 20.0 - 28.0 mmol/L   Acid-base deficit 0.7 0.0 - 2.0 mmol/L   O2 Saturation 95.5 %   Patient temperature 37.0    Collection site RIGHT RADIAL    Drawn by [id number]    Sample type ARTERIAL    Allens test (pass/fail) PASS PASS    Comment: Performed at [facility], [address] [place], [place] [address]  Glucose, capillary     Status: None   Collection Time: 09/21/17  6:12 AM  Result Value Ref Range   Glucose-Capillary 72 70 - 99 mg/dL   Comment 1 Notify RN    Comment 2 Document in Chart   Glucose, capillary     Status: Abnormal   Collection Time: 09/21/17  6:19 AM  Result Value Ref Range   Glucose-Capillary 67 (L) 70 - 99 mg/dL   Comment 1 Notify RN    Comment 2 Document in Chart   CBC with Differential/Platelet     Status: Abnormal   Collection Time: 09/21/17  6:20 AM  Result Value Ref Range   WBC 11.2 (H) 4.0 - 10.5 K/uL   RBC 4.40 4.22 - 5.81 MIL/uL   Hemoglobin 11.1 (L) 13.0 - 17.0 g/dL   HCT 36.4 (L) 39.0 - 52.0 %   MCV 82.7 78.0 - 100.0 fL   MCH 25.2 (L) 26.0 - 34.0 pg   MCHC 30.5 30.0 - 36.0 g/dL   RDW 16.4 (H) 11.5 - 15.5 %   Platelets 238 150 - 400 K/uL    Comment: Performed at  [facility], [address], [place], [place] [address]   Neutrophils Relative %  %    THIS TEST WAS ORDERED IN ERROR AND HAS BEEN CREDITED.   Neutro Abs  1.7 - 7.7 K/uL    THIS TEST WAS ORDERED IN ERROR AND HAS BEEN CREDITED.   Band Neutrophils  %    THIS TEST WAS ORDERED IN ERROR AND HAS BEEN CREDITED.   Lymphocytes Relative  %    THIS TEST WAS ORDERED IN ERROR AND HAS BEEN CREDITED.   Lymphs Abs  0.7 - 4.0 K/uL    THIS TEST WAS ORDERED IN ERROR AND HAS BEEN CREDITED.   Monocytes Relative  %    THIS TEST WAS ORDERED IN ERROR AND HAS BEEN CREDITED.   Monocytes Absolute  0.1 - 1.0 K/uL    THIS TEST WAS ORDERED IN ERROR AND HAS BEEN CREDITED.   Eosinophils Relative  %    THIS TEST WAS ORDERED IN ERROR AND HAS BEEN CREDITED.   Eosinophils Absolute  0.0 - 0.7 K/uL    THIS TEST WAS ORDERED IN ERROR AND HAS BEEN CREDITED.   Basophils Relative  %    THIS TEST WAS ORDERED IN ERROR AND HAS BEEN CREDITED.   Basophils Absolute  0.0 - 0.1 K/uL    THIS TEST WAS ORDERED IN ERROR AND HAS BEEN CREDITED.   WBC Morphology      THIS TEST WAS ORDERED IN ERROR AND HAS BEEN CREDITED.   RBC Morphology      THIS TEST WAS ORDERED IN ERROR AND HAS BEEN CREDITED.   Smear Review      THIS TEST WAS ORDERED IN ERROR AND HAS BEEN CREDITED.   Other  %    THIS TEST WAS ORDERED IN ERROR AND HAS BEEN CREDITED.   nRBC  0 /100 WBC    THIS TEST WAS ORDERED IN ERROR AND HAS BEEN CREDITED.   Metamyelocytes Relative  %  THIS TEST WAS ORDERED IN ERROR AND HAS BEEN CREDITED.   Myelocytes  %    THIS TEST WAS ORDERED IN ERROR AND HAS BEEN CREDITED.   Promyelocytes Relative  %    THIS TEST WAS ORDERED IN ERROR AND HAS BEEN CREDITED.   Blasts  %    THIS TEST WAS ORDERED IN ERROR AND HAS BEEN CREDITED.  Glucose, capillary     Status: None   Collection Time: 09/21/17 12:06 PM  Result Value Ref Range   Glucose-Capillary 87 70 - 99 mg/dL  Magnesium     Status: None   Collection Time: 09/21/17  5:32 PM  Result Value  Ref Range   Magnesium 2.3 1.7 - 2.4 mg/dL    Comment: Performed at [facility], [address], [place], [place] [address]  Phosphorus     Status: None   Collection Time: 09/21/17  5:32 PM  Result Value Ref Range   Phosphorus 4.4 2.5 - 4.6 mg/dL    Comment: Performed at [facility], [address], [place], [place] [address]  Glucose, capillary     Status: Abnormal   Collection Time: 09/21/17  5:56 PM  Result Value Ref Range   Glucose-Capillary 117 (H) 70 - 99 mg/dL   Comment 1 Notify RN    Comment 2 Document in Chart     Lipid Panel Recent Labs    09/19/17 0848  TRIG 202*    Studies/Results:   Medications:  Scheduled Meds: . chlorhexidine gluconate (MEDLINE KIT)  15 mL Mouth Rinse BID  . feeding supplement (PRO-STAT SUGAR FREE 64)  60 mL Per Tube QID  . [START ON 09/22/2017] feeding supplement (VITAL HIGH PROTEIN)  1,000 mL Per Tube Q24H  . furosemide  20 mg Intravenous Q12H  . ipratropium-albuterol  3 mL Nebulization Q6H  . mouth rinse  15 mL Mouth Rinse 10 times per day  . methylPREDNISolone (SOLU-MEDROL) injection  40 mg Intravenous Q12H  . pantoprazole (PROTONIX) IV  40 mg Intravenous Q12H   Continuous Infusions: . fentaNYL 10 mcg/ml infusion 300 mcg/hr (09/21/17 1615)  . lactated ringers 25 mL/hr at 09/20/17 0539  . levETIRAcetam Stopped (09/21/17 1418)  . metronidazole Stopped (09/21/17 1850)  . midazolam (VERSED) infusion 10 mg/hr (09/21/17 1744)  . valproate sodium Stopped (09/21/17 1008)   PRN Meds:.acetaminophen **OR** acetaminophen, fentaNYL, fentaNYL (SUBLIMAZE) injection, midazolam, midazolam, midazolam, ondansetron **OR** ondansetron (ZOFRAN) IV     LOS: 2 days   Reginald [name] A. Merlene [name], M.D.  Diplomate, Tax adviser of Psychiatry and Neurology ( Neurology).

## 2017-09-21 NOTE — Progress Notes (Signed)
Subjective: He continues intubated and on the ventilator.  His FiO2 is up to 60%.  He still gets somewhat agitated with stimulation.  He is relatively hypotensive.  He is very positive with intake and output.  Objective: Vital signs in last 24 hours: Temp:  [98.4 F (36.9 C)-99.4 F (37.4 C)] 98.9 F (37.2 C) (09/05 0400) Pulse Rate:  [59-91] 82 (09/05 0800) Resp:  [12-26] 14 (09/05 0800) BP: (78-122)/(47-97) 91/56 (09/05 0800) SpO2:  [84 %-100 %] 97 % (09/05 0800) FiO2 (%):  [40 %-60 %] 60 % (09/05 0419) Weight:  [122.6 kg] 122.6 kg (09/05 0400) Weight change: 2.1 kg Last BM Date: (unknown)  Intake/Output from previous day: 09/04 0701 - 09/05 0700 In: 3598.4 [I.V.:3348.4; IV Piggyback:250] Out: 1600 [Urine:1600]  PHYSICAL EXAM General appearance: Intubated sedated at times agitated Resp: rhonchi bilaterally Cardio: regular rate and rhythm, S1, S2 normal, no murmur, click, rub or gallop GI: soft, non-tender; bowel sounds normal; no masses,  no organomegaly Extremities: He has swelling of his arms and less of his legs  Lab Results:  Results for orders placed or performed during the hospital encounter of 09/19/17 (from the past 48 hour(s))  Hemoglobin and hematocrit, blood     Status: None   Collection Time: 09/19/17  8:48 AM  Result Value Ref Range   Hemoglobin 13.3 13.0 - 17.0 g/dL   HCT 41.3 39.0 - 52.0 %    Comment: Performed at [facility], [address] [place], [place] [address]  Triglycerides     Status: Abnormal   Collection Time: 09/19/17  8:48 AM  Result Value Ref Range   Triglycerides 202 (H) <150 mg/dL    Comment: Performed at [facility], [address], [place], [place] [address]  Glucose, capillary     Status: Abnormal   Collection Time: 09/19/17 12:34 PM  Result Value Ref Range   Glucose-Capillary 137 (H) 70 - 99 mg/dL  Hemoglobin and hematocrit, blood     Status: Abnormal   Collection Time: 09/19/17  2:48 PM  Result Value Ref Range   Hemoglobin  12.5 (L) 13.0 - 17.0 g/dL   HCT 38.6 (L) 39.0 - 52.0 %    Comment: Performed at [facility], [address] [place], [place] [address]  Glucose, capillary     Status: Abnormal   Collection Time: 09/19/17  5:17 PM  Result Value Ref Range   Glucose-Capillary 111 (H) 70 - 99 mg/dL  CBC     Status: Abnormal   Collection Time: 09/19/17  7:00 PM  Result Value Ref Range   WBC 12.1 (H) 4.0 - 10.5 K/uL   RBC 4.68 4.22 - 5.81 MIL/uL   Hemoglobin 12.2 (L) 13.0 - 17.0 g/dL   HCT 37.8 (L) 39.0 - 52.0 %   MCV 80.8 78.0 - 100.0 fL   MCH 26.1 26.0 - 34.0 pg   MCHC 32.3 30.0 - 36.0 g/dL   RDW 15.8 (H) 11.5 - 15.5 %   Platelets 300 150 - 400 K/uL    Comment: Performed at [facility], [address], [place],  [address]  Glucose, capillary     Status: Abnormal   Collection Time: 09/19/17 11:54 PM  Result Value Ref Range   Glucose-Capillary 102 (H) 70 - 99 mg/dL  Blood gas, arterial     Status: Abnormal   Collection Time: 09/20/17  6:02 AM  Result Value Ref Range   FIO2 45.00    Delivery systems VENTILATOR    Mode PRESSURE REGULATED  VOLUME CONTROL    VT 650 mL   LHR 16.0 resp/min   Peep/cpap 5.0 cm H20   pH, Arterial 7.409 7.350 - 7.450   pCO2 arterial 39.9 32.0 - 48.0 mmHg   pO2, Arterial 76.6 (L) 83.0 - 108.0 mmHg   Bicarbonate 24.9 20.0 - 28.0 mmol/L   Acid-Base Excess 0.6 0.0 - 2.0 mmol/L   O2 Saturation 94.7 %   Patient temperature 37.0    Collection site RIGHT RADIAL    Drawn by [id number]    Sample type ARTERIAL    Allens test (pass/fail) PASS PASS    Comment: Performed at [facility], [address], [place], [place] [address]  HIV antibody (Routine Testing)     Status: None   Collection Time: 09/20/17  9:38 AM  Result Value Ref Range   HIV Screen 4th Generation wRfx Non Reactive Non Reactive    Comment: (NOTE) Performed At: [facility] [place], [place] [id number] Rush Farmer MD [id number]   CBC WITH DIFFERENTIAL     Status: Abnormal    Collection Time: 09/20/17  9:38 AM  Result Value Ref Range   WBC 10.1 4.0 - 10.5 K/uL   RBC 4.60 4.22 - 5.81 MIL/uL   Hemoglobin 11.8 (L) 13.0 - 17.0 g/dL   HCT 37.6 (L) 39.0 - 52.0 %   MCV 81.7 78.0 - 100.0 fL   MCH 25.7 (L) 26.0 - 34.0 pg   MCHC 31.4 30.0 - 36.0 g/dL   RDW 16.3 (H) 11.5 - 15.5 %   Platelets 265 150 - 400 K/uL   Neutrophils Relative % 57 %   Neutro Abs 5.8 1.7 - 7.7 K/uL   Lymphocytes Relative 30 %   Lymphs Abs 3.0 0.7 - 4.0 K/uL   Monocytes Relative 12 %   Monocytes Absolute 1.2 (H) 0.1 - 1.0 K/uL   Eosinophils Relative 1 %   Eosinophils Absolute 0.1 0.0 - 0.7 K/uL   Basophils Relative 0 %   Basophils Absolute 0.0 0.0 - 0.1 K/uL    Comment: Performed at [facility], [address], [place], [place] [address]  Comprehensive metabolic panel     Status: Abnormal   Collection Time: 09/20/17  9:38 AM  Result Value Ref Range   Sodium 141 135 - 145 mmol/L   Potassium 3.4 (L) 3.5 - 5.1 mmol/L    Comment: DELTA CHECK NOTED   Chloride 109 98 - 111 mmol/L   CO2 28 22 - 32 mmol/L   Glucose, Bld 84 70 - 99 mg/dL   BUN 14 6 - 20 mg/dL   Creatinine, Ser 1.66 (H) 0.61 - 1.24 mg/dL   Calcium 8.0 (L) 8.9 - 10.3 mg/dL   Total Protein 5.9 (L) 6.5 - 8.1 g/dL   Albumin 3.1 (L) 3.5 - 5.0 g/dL   AST 43 (H) 15 - 41 U/L   ALT 30 0 - 44 U/L   Alkaline Phosphatase 54 38 - 126 U/L   Total Bilirubin 0.6 0.3 - 1.2 mg/dL   GFR calc non Af Amer 45 (L) >60 mL/min   GFR calc Af Amer 52 (L) >60 mL/min    Comment: (NOTE) The eGFR has been calculated using the CKD EPI equation. This calculation has not been validated in all clinical situations. eGFR's persistently <60 mL/min signify possible Chronic Kidney Disease.    Anion gap 4 (L) 5 - 15    Comment: Performed at [facility], [address], [place], [place] [address]  Glucose, capillary  Status: None   Collection Time: 09/20/17 11:19 AM  Result Value Ref Range   Glucose-Capillary 76 70 - 99 mg/dL  Urinalysis, Routine w reflex  microscopic     Status: Abnormal   Collection Time: 09/20/17 11:00 PM  Result Value Ref Range   Color, Urine YELLOW YELLOW   APPearance HAZY (A) CLEAR   Specific Gravity, Urine 1.016 1.005 - 1.030   pH 5.0 5.0 - 8.0   Glucose, UA NEGATIVE NEGATIVE mg/dL   Hgb urine dipstick SMALL (A) NEGATIVE   Bilirubin Urine NEGATIVE NEGATIVE   Ketones, ur NEGATIVE NEGATIVE mg/dL   Protein, ur NEGATIVE NEGATIVE mg/dL   Nitrite NEGATIVE NEGATIVE   Leukocytes, UA NEGATIVE NEGATIVE   RBC / HPF >50 (H) 0 - 5 RBC/hpf   WBC, UA 21-50 0 - 5 WBC/hpf   Bacteria, UA NONE SEEN NONE SEEN   Uric Acid Crys, UA PRESENT     Comment: Performed at [facility], [address], [place], [place] [address]  Glucose, capillary     Status: Abnormal   Collection Time: 09/21/17 12:18 AM  Result Value Ref Range   Glucose-Capillary 35 (LL) 70 - 99 mg/dL   Comment 1 Notify RN    Comment 2 Document in Chart   Glucose, capillary     Status: None   Collection Time: 09/21/17 12:20 AM  Result Value Ref Range   Glucose-Capillary 81 70 - 99 mg/dL   Comment 1 Notify RN    Comment 2 Document in Chart   Magnesium     Status: Abnormal   Collection Time: 09/21/17  4:24 AM  Result Value Ref Range   Magnesium 2.5 (H) 1.7 - 2.4 mg/dL    Comment: Performed at [facility], [address] [place], [place] [address]  Phosphorus     Status: Abnormal   Collection Time: 09/21/17  4:24 AM  Result Value Ref Range   Phosphorus 4.7 (H) 2.5 - 4.6 mg/dL    Comment: Performed at [facility], [address] [place], [place] [address]  Basic metabolic panel     Status: Abnormal   Collection Time: 09/21/17  4:25 AM  Result Value Ref Range   Sodium 142 135 - 145 mmol/L   Potassium 3.9 3.5 - 5.1 mmol/L   Chloride 111 98 - 111 mmol/L   CO2 22 22 - 32 mmol/L   Glucose, Bld 80 70 - 99 mg/dL   BUN 13 6 - 20 mg/dL   Creatinine, Ser 1.56 (H) 0.61 - 1.24 mg/dL   Calcium 8.0 (L) 8.9 - 10.3 mg/dL   GFR calc non Af Amer 48 (L) >60 mL/min   GFR calc  Af Amer 56 (L) >60 mL/min    Comment: (NOTE) The eGFR has been calculated using the CKD EPI equation. This calculation has not been validated in all clinical situations. eGFR's persistently <60 mL/min signify possible Chronic Kidney Disease.    Anion gap 9 5 - 15    Comment: Performed at [facility], [address], [place], [place] [address]  Blood gas, arterial     Status: None   Collection Time: 09/21/17  4:40 AM  Result Value Ref Range   FIO2 60.00    Delivery systems VENTILATOR    Mode PRESSURE REGULATED VOLUME CONTROL    VT 650 mL   LHR 16 resp/min   Peep/cpap 5.0 cm H20   pH, Arterial 7.365 7.350 - 7.450   pCO2 arterial 43.0 32.0 - 48.0 mmHg   pO2,  Arterial 84.4 83.0 - 108.0 mmHg   Bicarbonate 23.6 20.0 - 28.0 mmol/L   Acid-base deficit 0.7 0.0 - 2.0 mmol/L   O2 Saturation 95.5 %   Patient temperature 37.0    Collection site RIGHT RADIAL    Drawn by [id number]    Sample type ARTERIAL    Allens test (pass/fail) PASS PASS    Comment: Performed at [facility], [address] [place], [place] [address]  Glucose, capillary     Status: None   Collection Time: 09/21/17  6:12 AM  Result Value Ref Range   Glucose-Capillary 72 70 - 99 mg/dL   Comment 1 Notify RN    Comment 2 Document in Chart   Glucose, capillary     Status: Abnormal   Collection Time: 09/21/17  6:19 AM  Result Value Ref Range   Glucose-Capillary 67 (L) 70 - 99 mg/dL   Comment 1 Notify RN    Comment 2 Document in Chart   CBC with Differential/Platelet     Status: Abnormal   Collection Time: 09/21/17  6:20 AM  Result Value Ref Range   WBC 11.2 (H) 4.0 - 10.5 K/uL   RBC 4.40 4.22 - 5.81 MIL/uL   Hemoglobin 11.1 (L) 13.0 - 17.0 g/dL   HCT 36.4 (L) 39.0 - 52.0 %   MCV 82.7 78.0 - 100.0 fL   MCH 25.2 (L) 26.0 - 34.0 pg   MCHC 30.5 30.0 - 36.0 g/dL   RDW 16.4 (H) 11.5 - 15.5 %   Platelets 238 150 - 400 K/uL    Comment: Performed at [facility], [address], [place], [place] [address]   Neutrophils  Relative %  %    THIS TEST WAS ORDERED IN ERROR AND HAS BEEN CREDITED.   Neutro Abs  1.7 - 7.7 K/uL    THIS TEST WAS ORDERED IN ERROR AND HAS BEEN CREDITED.   Band Neutrophils  %    THIS TEST WAS ORDERED IN ERROR AND HAS BEEN CREDITED.   Lymphocytes Relative  %    THIS TEST WAS ORDERED IN ERROR AND HAS BEEN CREDITED.   Lymphs Abs  0.7 - 4.0 K/uL    THIS TEST WAS ORDERED IN ERROR AND HAS BEEN CREDITED.   Monocytes Relative  %    THIS TEST WAS ORDERED IN ERROR AND HAS BEEN CREDITED.   Monocytes Absolute  0.1 - 1.0 K/uL    THIS TEST WAS ORDERED IN ERROR AND HAS BEEN CREDITED.   Eosinophils Relative  %    THIS TEST WAS ORDERED IN ERROR AND HAS BEEN CREDITED.   Eosinophils Absolute  0.0 - 0.7 K/uL    THIS TEST WAS ORDERED IN ERROR AND HAS BEEN CREDITED.   Basophils Relative  %    THIS TEST WAS ORDERED IN ERROR AND HAS BEEN CREDITED.   Basophils Absolute  0.0 - 0.1 K/uL    THIS TEST WAS ORDERED IN ERROR AND HAS BEEN CREDITED.   WBC Morphology      THIS TEST WAS ORDERED IN ERROR AND HAS BEEN CREDITED.   RBC Morphology      THIS TEST WAS ORDERED IN ERROR AND HAS BEEN CREDITED.   Smear Review      THIS TEST WAS ORDERED IN ERROR AND HAS BEEN CREDITED.   Other  %    THIS TEST WAS ORDERED IN ERROR AND HAS BEEN CREDITED.   nRBC  0 /100 WBC    THIS TEST WAS ORDERED IN ERROR AND  HAS BEEN CREDITED.   Metamyelocytes Relative  %    THIS TEST WAS ORDERED IN ERROR AND HAS BEEN CREDITED.   Myelocytes  %    THIS TEST WAS ORDERED IN ERROR AND HAS BEEN CREDITED.   Promyelocytes Relative  %    THIS TEST WAS ORDERED IN ERROR AND HAS BEEN CREDITED.   Blasts  %    THIS TEST WAS ORDERED IN ERROR AND HAS BEEN CREDITED.    ABGS Recent Labs    09/21/17 0440  PHART 7.365  PO2ART 84.4  HCO3 23.6   CULTURES Recent Results (from the past 240 hour(s))  Urine Culture     Status: None   Collection Time: 09/19/17  1:20 AM  Result Value Ref Range Status   Specimen Description   Final    URINE,  CATHETERIZED Performed at [facility], [address], [place], [place] [address]    Special Requests   Final    NONE Performed at [facility], [address], [place], [place] [address]    Culture   Final    NO GROWTH Performed at [facility], [place] [address] [place], [place] [address]    Report Status 09/20/2017 FINAL  Final  Blood culture (routine x 2)     Status: None (Preliminary result)   Collection Time: 09/19/17  2:04 AM  Result Value Ref Range Status   Specimen Description BLOOD RIGHT HAND  Final   Special Requests   Final    BOTTLES DRAWN AEROBIC AND ANAEROBIC Blood Culture adequate volume   Culture   Final    NO GROWTH 2 DAYS Performed at [facility], [address] [place], [place] [address]    Report Status PENDING  Incomplete  Blood culture (routine x 2)     Status: None (Preliminary result)   Collection Time: 09/19/17  2:06 AM  Result Value Ref Range Status   Specimen Description BLOOD LEFT HAND  Final   Special Requests   Final    BOTTLES DRAWN AEROBIC ONLY Blood Culture adequate volume   Culture   Final    NO GROWTH 2 DAYS Performed at [facility], [address] [place], [place] [address]    Report Status PENDING  Incomplete  MRSA PCR Screening     Status: None   Collection Time: 09/19/17  3:50 AM  Result Value Ref Range Status   MRSA by PCR NEGATIVE NEGATIVE Final    Comment:        The GeneXpert MRSA Assay (FDA approved for NASAL specimens only), is one component of a comprehensive MRSA colonization surveillance program. It is not intended to diagnose MRSA infection nor to guide or monitor treatment for MRSA infections. Performed at [facility], [address] [place], [place] [address]    Studies/Results: Dg Chest 1v Repeat Same Day  Result Date: 09/19/2017 CLINICAL DATA:  Respiratory failure, endotracheal tube placement EXAM: CHEST - 1 VIEW SAME DAY COMPARISON:  Portable chest x-ray of 09/19/2016 FINDINGS: The tip of the  endotracheal tube appears to be approximately 4.3 cm above the carina. No active infiltrate or effusion is seen. Mediastinal and hilar contours are unchanged and mild cardiomegaly is stable. IMPRESSION: Tip of endotracheal tube approximately 4.3 cm above the carina. No active lung disease. Electronically Signed   By: Ivar Drape M.D.   On: 09/19/2017 09:52   Portable Chest Xray  Result Date: 09/20/2017 CLINICAL DATA:  Respiratory failure. EXAM: PORTABLE CHEST 1 VIEW COMPARISON:  09/19/2017 FINDINGS:  Endotracheal tube is 4.8 cm above the carina. Nasogastric tube extends into the left upper abdomen. There are new densities at the left lung base. Heart size is within normal limits and stable. Negative for a pneumothorax. IMPRESSION: New left basilar densities. Findings could represent volume loss but cannot exclude pneumonia or aspiration. Endotracheal tube is appropriately positioned. Electronically Signed   By: Markus Daft M.D.   On: 09/20/2017 10:21    Medications:  Prior to Admission:  Medications Prior to Admission  Medication Sig Dispense Refill Last Dose  . Eslicarbazepine Acetate (APTIOM) 400 MG TABS Take 1 tablet by mouth 2 (two) times daily.   unknown  . levETIRAcetam (KEPPRA) 750 MG tablet Take 1,500 mg by mouth 2 (two) times daily.   1 unknown  . amLODipine (NORVASC) 10 MG tablet Take 10 mg by mouth daily.  1 03/27/2017 at Unknown time  . aspirin 81 MG chewable tablet Chew 1 tablet (81 mg total) by mouth daily. 30 tablet 0 03/27/2017 at Unknown time  . hydrochlorothiazide (HYDRODIURIL) 25 MG tablet Take 1 tablet by mouth every morning.  1 03/27/2017 at Unknown time  . LORazepam (ATIVAN) 1 MG tablet Take 1 every 6 hours if needed for minor seizures or shaking or anxiety 15 tablet 0   . olmesartan (BENICAR) 20 MG tablet Take 20 mg by mouth daily.  0 03/27/2017 at Unknown time  . Omega-3 Fatty Acids (FISH OIL) 1000 MG CAPS Take 1 capsule by mouth daily.    Not Taking at Unknown time  . phenytoin  (DILANTIN) 100 MG ER capsule Take 2 capsules (200 mg total) by mouth 2 (two) times daily. (Patient not taking: Reported on 03/28/2017) 120 capsule 0 Not Taking at Unknown time  . potassium chloride (K-DUR) 10 MEQ tablet Take 1 tablet (10 mEq total) by mouth daily. 12 tablet 0   . tadalafil (CIALIS) 5 MG tablet Take 5 mg by mouth daily as needed for erectile dysfunction.   unknown   Scheduled: . chlorhexidine gluconate (MEDLINE KIT)  15 mL Mouth Rinse BID  . feeding supplement (PRO-STAT SUGAR FREE 64)  30 mL Per Tube BID  . feeding supplement (VITAL HIGH PROTEIN)  1,000 mL Per Tube Q24H  . ipratropium-albuterol  3 mL Nebulization Q6H  . mouth rinse  15 mL Mouth Rinse 10 times per day   Continuous: . fentaNYL 10 mcg/ml infusion 250 mcg/hr (09/21/17 0619)  . lactated ringers 25 mL/hr at 09/20/17 0539  . levETIRAcetam Stopped (09/21/17 0131)  . metronidazole 500 mg (09/21/17 0802)  . pantoprozole (PROTONIX) infusion 8 mg/hr (09/21/17 0012)  . propofol (DIPRIVAN) infusion 40 mcg/kg/min (09/21/17 0811)  . valproate sodium Stopped (09/20/17 2111)   HQP:[id number] **OR** acetaminophen, fentaNYL, fentaNYL (SUBLIMAZE) injection, midazolam, ondansetron **OR** ondansetron (ZOFRAN) IV  Assesment: He has been admitted with status epilepticus felt to be secondary to abrupt discontinuation of his seizure medications.  He is not had any seizures since admission but he has been on propofol.  He was intubated on admission for airway protection also has some element of COPD and x-ray is suggestive that he has aspirated.  He is hypotensive and his INO is very positive from fluid resuscitation but he may be volume overloaded  There was concern that he might have a GI bleed this appears to be minor and some of it may have been from biting his tongue on pantoprazole  He probably has some element of COPD at baseline and since he is on 60% with increase in  FiO2 I am going to put him on steroids  Nutrition  will be a problem and I think he is okay to start tube feeding now  He might be helped by getting some Lasix but with his soft blood pressures that may not be possible Principal Problem:   Status epilepticus ([place]) Active Problems:   History of hypertension   Hypokalemia   Alcohol abuse   Lactic acidosis   UGI bleed    Plan: Start tube feedings.  Start steroids.  Flagyl for presumed aspiration pneumonia.  Questionable Lasix.    LOS: 2 days   Madasyn Heath L 09/21/2017, 8:33 AM

## 2017-09-21 NOTE — Progress Notes (Signed)
PROGRESS NOTE    Reginald [name]  ERX:[id number] DOB: May 29, 1960 DOA: 09/19/2017 PCP: Antony Contras, MD     Brief Narrative:  57 y.o. male with medical history significant of hypertension, seizure disorder alcohol abuse who was brought to the emergency department via EMS with status epilepticus.  He was intubated for airway protection.  Apparently, the patient uses cannabis for his seizures, but his wife stated to the ED nursing staff that he has not used that in the past couple days and may be the reason why he is having seizures.  After the NG tube was inserted, he draining a substantial amount of blood, which raises the possibility of UGI.  I doubt that the patient bleed down much from tongue bite.  There is no further history is available.  Assessment & Plan: 1-status epilepticus ([place]) -Intubated for airway protection -Continue IV Keppra and also Depakote -Neurology on board will follow recommendations -EEG demonstrated some focal activity  2-acute respiratory failure: Concern for underlying COPD and also acute aspiration. -As mentioned above in the setting of a status epilepticus patient mechanically ventilated and intubated for airway protection. -Increasing O2 sat requirement -Following pulmonology recommendations has been started on steroids and also Flagyl. -Some vascular congestion also seen on x-ray and now that his blood pressure has improved we will add low-dose of Lasix. -Appreciate pulmonology service assistance and recommendation -Continue weaning sedation and hopefully will be able to be extubated soon.  3-History of hypertension -Blood pressure soft but improved. -most likely from sedation -IVF's given; now with concerns for vascular congestion and edema. Will adjust IVF's rate. -continue to follow VS  4-Hypokalemia -Most likely associated with alcohol abuse -Follow electrolytes and replete and as needed.  5-Alcohol abuse -Minimize as per wife history -Currently  receiving sedation; which has been changed to midazolam for better control. -Will follow clinical response  -cessation recommendations discussed with wife at bedside.  6-Lactic acidosis -IVF's given -repeat level in am  7-UGI bleed -maybe gastritis and/or blood from tongue bite -Hgb has remained stable -continue PPI IV; but will discontinue drip.  8-AKI -in the setting of decrease perfusion with low BP and most likely mild rhabdomyolysis with seizure activity. -maintain BP -minimize nephrotoxic agents -follow renal function trend  -follow UA and renal US.  -low dose lasix to be added today.  DVT prophylaxis: SCD's Code Status: Full code Family Communication: Wife at bedside Disposition Plan: Remains inpatient in the ICU; continue mechanical ventilator, continue weaning sedation.  Continue IV antiepileptic drugs and follow clinical response.  Appreciate pulmonology and neurology service recommendation.  Consultants:   Neurology  Pulmonology  GI  Procedures:   Mechanically ventilated.  -See below for x-ray reports.  Antimicrobials:  Anti-infectives (From admission, onward)   Start     Dose/Rate Route Frequency Ordered Stop   09/21/17 0730  metroNIDAZOLE (FLAGYL) IVPB 500 mg     500 mg 100 mL/hr over 60 Minutes Intravenous Every 6 hours 09/21/17 0725         Subjective: Afebrile, remains intubated and mechanically ventilated.  Patient continued to experience intermittent episode of agitation and restlessness.  He also had an episode of vomiting after tube feedings initiated with concern for aspiration.  Chest x-ray demonstrating vascular congestion and some new infiltrates.  Objective: Vitals:   09/21/17 0800 09/21/17 0837 09/21/17 0859 09/21/17 0901  BP: (!) 91/56     Pulse: 82 93    Resp: 14 18    Temp:  99.2 F (37.3 C)  TempSrc:  Axillary    SpO2: 97% 100% 95%   Weight:      Height:    _0  (1.88 m)    Intake/Output Summary (Last 24 hours) at  09/21/2017 1446 Last data filed at 09/21/2017 1440 Gross per 24 hour  Intake 1898.93 ml  Output 2350 ml  Net -451.07 ml   Filed Weights   09/19/17 0500 09/20/17 0500 09/21/17 0400  Weight: 116.1 kg 120.5 kg 122.6 kg    Examination: General exam: Afebrile, patient able to follow simple commands at request, continued to be sedated, intubated and mechanically ventilated.  During sedation weaning process he becomes significantly agitated, and they also throwing up to feedings; increased concern for aspiration.  O2 sats requirements continued to be elevated.   Respiratory system: Positive for scattered rhonchi, decreased breath sounds at the bases, no rubs, no gallops. Cardiovascular system:RRR. No murmurs, rubs, gallops. Gastrointestinal system: Abdomen is nondistended, soft and nontender. No organomegaly or masses felt. Normal bowel sounds heard. Central nervous system: No focal neurological deficits.  Intermittently agitated.  Continue sedation. Extremities: No cyanosis or clubbing.  Trace edema bilaterally (upper extremities more than lower extremities). Skin: No rashes, lesions or ulcers Psychiatry: Unable to properly assess as the patient is sedated, intubated and mechanically ventilated; But he is able to follow very simple commands and continued to experience intermittent episode of agitation.   Data Reviewed: I have personally reviewed following labs and imaging studies  CBC: Recent Labs  Lab 09/19/17 0100  09/19/17 0848 09/19/17 1448 09/19/17 1900 09/20/17 0938 09/21/17 0620  WBC 14.4*  --   --   --  12.1* 10.1 11.2*  NEUTROABS 7.3  --   --   --   --  5.8 THIS TEST WAS ORDERED IN ERROR AND HAS BEEN CREDITED.  HGB 15.4   < > 13.3 12.5* 12.2* 11.8* 11.1*  HCT 48.3   < > 41.3 38.6* 37.8* 37.6* 36.4*  MCV 83.6  --   --   --  80.8 81.7 82.7  PLT 400  --   --   --  300 265 238   < > = values in this interval not displayed.   Basic Metabolic Panel: Recent Labs  Lab  09/19/17 0100 09/19/17 0252 09/19/17 0720 09/20/17 0938 09/21/17 0424 09/21/17 0425  NA 140  --  139 141  --  142  K 3.2*  --  4.4 3.4*  --  3.9  CL 102  --  108 109  --  111  CO2 14*  --  24 28  --  22  GLUCOSE 234*  --  125* 84  --  80  BUN 12  --  13 14  --  13  CREATININE 1.34*  --  1.22 1.66*  --  1.56*  CALCIUM 9.2  --  8.2* 8.0*  --  8.0*  MG  --  2.5*  --   --  2.5*  --   PHOS  --  2.6  --   --  4.7*  --    GFR: Estimated Creatinine Clearance: 74.5 mL/min (A) (by C-G formula based on SCr of 1.56 mg/dL (H)).   Liver Function Tests: Recent Labs  Lab 09/19/17 0100 09/20/17 0938  AST 47* 43*  ALT 38 30  ALKPHOS 77 54  BILITOT 0.7 0.6  PROT 8.8* 5.9*  ALBUMIN 4.5 3.1*   Coagulation Profile: Recent Labs  Lab 09/19/17 0100  INR 1.14   Cardiac Enzymes: Recent Labs  Lab 09/19/17 0100  TROPONINI <0.03   CBG: Recent Labs  Lab 09/21/17 0018 09/21/17 0020 09/21/17 0612 09/21/17 0619 09/21/17 1206  GLUCAP 35* 81 72 67* 87   Lipid Profile: Recent Labs    09/19/17 0524 09/19/17 0848  TRIG 242* 202*   Urine analysis:    Component Value Date/Time   COLORURINE YELLOW 09/20/2017 2300   APPEARANCEUR HAZY (A) 09/20/2017 2300   LABSPEC 1.016 09/20/2017 2300   PHURINE 5.0 09/20/2017 2300   GLUCOSEU NEGATIVE 09/20/2017 2300   HGBUR SMALL (A) 09/20/2017 2300   BILIRUBINUR NEGATIVE 09/20/2017 2300   KETONESUR NEGATIVE 09/20/2017 2300   PROTEINUR NEGATIVE 09/20/2017 2300   NITRITE NEGATIVE 09/20/2017 2300   LEUKOCYTESUR NEGATIVE 09/20/2017 2300    Recent Results (from the past 240 hour(s))  Urine Culture     Status: None   Collection Time: 09/19/17  1:20 AM  Result Value Ref Range Status   Specimen Description   Final    URINE, CATHETERIZED Performed at [facility], [address], [place], [place] [address]    Special Requests   Final    NONE Performed at [facility], [address] [place], [place] [address]    Culture   Final    NO  GROWTH Performed at [facility], [place] [address], [place], [place] [address]    Report Status 09/20/2017 FINAL  Final  Blood culture (routine x 2)     Status: None (Preliminary result)   Collection Time: 09/19/17  2:04 AM  Result Value Ref Range Status   Specimen Description BLOOD RIGHT HAND  Final   Special Requests   Final    BOTTLES DRAWN AEROBIC AND ANAEROBIC Blood Culture adequate volume   Culture   Final    NO GROWTH 2 DAYS Performed at [facility], [address] [place], [place] [address]    Report Status PENDING  Incomplete  Blood culture (routine x 2)     Status: None (Preliminary result)   Collection Time: 09/19/17  2:06 AM  Result Value Ref Range Status   Specimen Description BLOOD LEFT HAND  Final   Special Requests   Final    BOTTLES DRAWN AEROBIC ONLY Blood Culture adequate volume   Culture   Final    NO GROWTH 2 DAYS Performed at [facility], [address] [place], [place] [address]    Report Status PENDING  Incomplete  MRSA PCR Screening     Status: None   Collection Time: 09/19/17  3:50 AM  Result Value Ref Range Status   MRSA by PCR NEGATIVE NEGATIVE Final    Comment:        The GeneXpert MRSA Assay (FDA approved for NASAL specimens only), is one component of a comprehensive MRSA colonization surveillance program. It is not intended to diagnose MRSA infection nor to guide or monitor treatment for MRSA infections. Performed at [facility], [address], [place], [place] [address]      Radiology Studies: Dg Chest [facility] 1 View  Result Date: 09/21/2017 CLINICAL DATA:  Respiratory failure EXAM: PORTABLE CHEST 1 VIEW COMPARISON:  Portable exam 0938 hours compared to 09/20/2017 FINDINGS: Tip of endotracheal tube projects 3.4 cm above carina. Nasogastric tube extends to at least the inferior mediastinum. Enlargement of cardiac silhouette with pulmonary vascular congestion. Mild RIGHT basilar atelectasis versus consolidation. Remaining lungs clear. No  gross pleural effusion or pneumothorax. IMPRESSION: Mild RIGHT basilar atelectasis versus consolidation. Electronically Signed   By: Crist Infante.D.  On: 09/21/2017 11:45   Portable Chest Xray  Result Date: 09/20/2017 CLINICAL DATA:  Respiratory failure. EXAM: PORTABLE CHEST 1 VIEW COMPARISON:  09/19/2017 FINDINGS: Endotracheal tube is 4.8 cm above the carina. Nasogastric tube extends into the left upper abdomen. There are new densities at the left lung base. Heart size is within normal limits and stable. Negative for a pneumothorax. IMPRESSION: New left basilar densities. Findings could represent volume loss but cannot exclude pneumonia or aspiration. Endotracheal tube is appropriately positioned. Electronically Signed   By: Markus Daft M.D.   On: 09/20/2017 10:21   Dg Chest Port 1v Same Day  Result Date: 09/21/2017 CLINICAL DATA:  Orogastric tube placement. EXAM: PORTABLE CHEST 1 VIEW COMPARISON:  09/21/2017. FINDINGS: Endotracheal tube in satisfactory position. Orogastric tube visualized at the thoracic inlet and obscured distally by the density of the overlying mediastinum. This appears to be faintly visualized in the left upper abdomen with its tip in the lateral aspect of the proximal stomach. Interval patchy opacity in the left lower lobe and medial right lung base. Previously demonstrated ill-defined opacity at the remainder of the right lung base is no longer seen. Stable mildly enlarged cardiac silhouette. Thoracic spine degenerative changes. IMPRESSION: 1. The orogastric tube is not adequately visualized with its tip most likely in the proximal stomach laterally. 2. Interval patchy pneumonia scratch the interval pneumonia or patchy atelectasis in the left lower lobe. 3. Interval pneumonia or patchy atelectasis in the medial right lung base. 4. Stable mild cardiomegaly. Electronically Signed   By: Claudie Revering M.D.   On: 09/21/2017 14:42    Scheduled Meds: . chlorhexidine gluconate (MEDLINE KIT)   15 mL Mouth Rinse BID  . feeding supplement (PRO-STAT SUGAR FREE 64)  60 mL Per Tube QID  . [START ON 09/22/2017] feeding supplement (VITAL HIGH PROTEIN)  1,000 mL Per Tube Q24H  . ipratropium-albuterol  3 mL Nebulization Q6H  . mouth rinse  15 mL Mouth Rinse 10 times per day  . methylPREDNISolone (SOLU-MEDROL) injection  40 mg Intravenous Q12H   Continuous Infusions: . fentaNYL 10 mcg/ml infusion    . lactated ringers 25 mL/hr at 09/20/17 0539  . levETIRAcetam Stopped (09/21/17 1418)  . metronidazole Stopped (09/21/17 1316)  . midazolam (VERSED) infusion 5 mg/hr (09/21/17 1440)  . pantoprozole (PROTONIX) infusion 8 mg/hr (09/21/17 1103)  . valproate sodium Stopped (09/21/17 1008)     LOS: 2 days    Time spent: 40 minutes.  Greater than 50% of this time was spent in direct contact with the patient, coordinating care and discussing relevant ongoing clinical issues at bedside with family member.  Case has been discussed with pulmonologist, adjustment has been done to his sedation protocol; patient initiated on a steroids for underlying COPD and also started on IV antibiotics for aspiration process.  Tube feedings initiated.  Patient remains intubated and mechanically ventilated.  Critically ill.    Barton Dubois, MD [facility] Pager [phone]  If 7PM-7AM, please contact night-coverage [email or web address] Password [email or web address] 09/21/2017, 2:46 PM

## 2017-09-21 NOTE — Progress Notes (Signed)
Nutrition Follow-up  DOCUMENTATION CODES:  Obesity unspecified  INTERVENTION:  Patient now off propofol.   Due to volume overload, will try to minimize TF volume. Will decrease rate and utilize modulars to meet protein needs.   TF regimen: Vital HP @ 25cc/hr  (600 ml per day) and Prostat 60 ml QID to provide 1400 kcals, 173 gm protein, 502 ml free water daily.   NUTRITION DIAGNOSIS:  Inadequate oral intake related to inability to eat as evidenced by NPO status.  GOAL:  Patient will meet greater than or equal to 90% of their needs  MONITOR:  Vent status  REASON FOR ASSESSMENT:  Consult Enteral/tube feeding initiation and management  ASSESSMENT:  57 y/o male PMHx HTN, Seizure disorder, ETOH abuse. Presented via EMS w/ status epilepticus. Intubated for airway protection. Had acute UGI after NGT inserted. GI/Pulm/neuro consulted.   Patient remains intubated. He has been unable to be weaned d/t severe agitation. He is maxed on propofol. Also has fentanyl going. Also requiring periodic versed infusions  CXR yesterday showed new L basilar opacities. Concern pt aspirated. Pt started on IV abx. He has developed volume overload. He has been started on steroids as well for possible COPD exacerbation.   Patient has declined from clinical standpoint. Will switch to using ASPEN/SCCM guidelines.   Due to difficulty maintaining RASS goal, pt being switched off of propofol to fentanyl/versed drips  Abdomen is firm/distended. No BM documented.   Pts bedweight is 121.4 kg today. 256 lbs (116.1 kg) remains dosing weight.   Patient is currently intubated on ventilator support MV: 10.0 L/min Temp (24hrs), Avg:99.1 F (37.3 C), Min:98.8 F (37.1 C), Max:99.4 F (37.4 C) Propofol: 34.6 ml/hr =  913 kcals/d  Labs: TG:242->202 (9/3), BG:67->but started on TF Meds: IV abx,  PPI, IVF, PPI, KCL, Fentanyl, Ativan  Recent Labs  Lab 09/19/17 0252 09/19/17 0720 09/20/17 0938 09/21/17 0424  09/21/17 0425  NA  --  139 141  --  142  K  --  4.4 3.4*  --  3.9  CL  --  108 109  --  111  CO2  --  24 28  --  22  BUN  --  13 14  --  13  CREATININE  --  1.22 1.66*  --  1.56*  CALCIUM  --  8.2* 8.0*  --  8.0*  MG 2.5*  --   --  2.5*  --   PHOS 2.6  --   --  4.7*  --   GLUCOSE  --  125* 84  --  80    Diet Order:   Diet Order            Diet NPO time specified  Diet effective now             EDUCATION NEEDS:  No education needs have been identified at this time  Skin:  Skin Assessment: Reviewed RN Assessment  Last BM:  Unknown  Height:  Ht Readings from Last 1 Encounters:  09/21/17 6\' 2"  (1.88 m)   Weight:  Wt Readings from Last 1 Encounters:  09/21/17 122.6 kg   Wt Readings from Last 10 Encounters:  09/21/17 122.6 kg  04/29/17 115.2 kg  03/28/17 115.2 kg  05/24/15 114.3 kg  03/20/15 105 kg  03/17/15 102.1 kg  05/06/13 102.5 kg   Ideal Body Weight:  86.36 kg  BMI:  Body mass index is 34.7 kg/m.  Estimated Nutritional Needs:  Kcal:  1275-1625 kcals (11-14 kcal/kg  bw) Protein:  >173g Pro (2g/kg IBW) Fluid:  Per MD guidelines  Christophe Louis RD, LDN, CNSC Clinical Nutrition Available Tues-Sat via Pager: [phone] 09/21/2017 11:49 AM

## 2017-09-22 ENCOUNTER — Inpatient Hospital Stay (HOSPITAL_COMMUNITY): Payer: No Typology Code available for payment source

## 2017-09-22 ENCOUNTER — Telehealth: Payer: Self-pay | Admitting: Gastroenterology

## 2017-09-22 DIAGNOSIS — J9601 Acute respiratory failure with hypoxia: Secondary | ICD-10-CM | POA: Diagnosis present

## 2017-09-22 LAB — LACTIC ACID, PLASMA: LACTIC ACID, VENOUS: 1.1 mmol/L (ref 0.5–1.9)

## 2017-09-22 LAB — BASIC METABOLIC PANEL
Anion gap: 7 (ref 5–15)
BUN: 13 mg/dL (ref 6–20)
CO2: 27 mmol/L (ref 22–32)
Calcium: 8.3 mg/dL — ABNORMAL LOW (ref 8.9–10.3)
Chloride: 109 mmol/L (ref 98–111)
Creatinine, Ser: 1.13 mg/dL (ref 0.61–1.24)
GFR calc Af Amer: >60 mL/min (ref >60)
GLUCOSE: 102 mg/dL — AB (ref 70–99)
POTASSIUM: 4.1 mmol/L (ref 3.5–5.1)
Sodium: 143 mmol/L (ref 135–145)

## 2017-09-22 LAB — BLOOD GAS, ARTERIAL
ACID-BASE EXCESS: 2.6 mmol/L — AB (ref 0.0–2.0)
BICARBONATE: 26 mmol/L (ref 20.0–28.0)
Drawn by: 21310
FIO2: 55
LHR: 16 {breaths}/min
MECHVT: 650 mL
O2 SAT: 91.4 %
PEEP/CPAP: 5 cmH2O
Patient temperature: 37
pCO2 arterial: 52.9 mmHg — ABNORMAL HIGH (ref 32.0–48.0)
pH, Arterial: 7.341 — ABNORMAL LOW (ref 7.350–7.450)
pO2, Arterial: 71 mmHg — ABNORMAL LOW (ref 83.0–108.0)

## 2017-09-22 LAB — GLUCOSE, CAPILLARY
GLUCOSE-CAPILLARY: 94 mg/dL (ref 70–99)
GLUCOSE-CAPILLARY: 101 mg/dL — AB (ref 70–99)
Glucose-Capillary: 91 mg/dL (ref 70–99)
Glucose-Capillary: 35 mg/dL — CL (ref 70–99)

## 2017-09-22 LAB — PHOSPHORUS
PHOSPHORUS: 3.5 mg/dL (ref 2.5–4.6)
Phosphorus: 4.1 mg/dL (ref 2.5–4.6)

## 2017-09-22 LAB — MAGNESIUM
Magnesium: 2.4 mg/dL (ref 1.7–2.4)
Magnesium: 2.5 mg/dL — ABNORMAL HIGH (ref 1.7–2.4)

## 2017-09-22 LAB — TRIGLYCERIDES: Triglycerides: 204 mg/dL — ABNORMAL HIGH (ref <150)

## 2017-09-22 MED ORDER — FENTANYL CITRATE (PF) 2500 MCG/50ML IJ SOLN
INTRAMUSCULAR | Status: AC
  Filled 2017-09-22: qty 50

## 2017-09-22 MED ORDER — MIDAZOLAM 50MG/50ML (1MG/ML) PREMIX INFUSION
INTRAVENOUS | Status: AC
  Filled 2017-09-22: qty 100

## 2017-09-22 MED ORDER — MIDAZOLAM 50MG/50ML (1MG/ML) PREMIX INFUSION
INTRAVENOUS | Status: AC
  Filled 2017-09-22: qty 50

## 2017-09-22 NOTE — Progress Notes (Signed)
We were initially consulted on this patient admitted with status epilepticus requiring intubation and OG placement. OG with coffee ground gastric contents suspicious for UGI Bleed. No known liver disease.Clinically he did not appear to have significant UGI bleed. And felt to be likely to tongue trauma during seizure, OG placement trauma; wife noted his mouth was "full of blood" prior to OG placement. Hgb remained stable while following peripherally. Octreotide was stopped and PPI continued.  Hgb has remained stable as per below. No other GI issues have occurred.  CBC Latest Ref Rng & Units 09/21/2017 09/20/2017 09/19/2017  WBC 4.0 - 10.5 K/uL 11.2(H) 10.1 12.1(H)  Hemoglobin 13.0 - 17.0 g/dL 11.1(L) 11.8(L) 12.2(L)  Hematocrit 39.0 - 52.0 % 36.4(L) 37.6(L) 37.8(L)  Platelets 150 - 400 K/uL 238 265 300    We will sign off for now. Please contact us if we can be of further assistance.  Thank you for allowing [place] to participate in the care of Per Loralie Champagne, DNP, AGNP-C Adult & Gerontological Nurse Practitioner [facility]

## 2017-09-22 NOTE — Progress Notes (Signed)
Patient is very restless, right now his sedation is turned up to keep him from rolling over and biting ET tube. Have suctioned out med amount of secretions. He sounds rhonchus especially on left lower. He is +[phone] on fluids, Albumin 3.1. He had a sleep study 2018 which shows him to wear CPAP 16. Upon extubation.

## 2017-09-22 NOTE — Progress Notes (Signed)
[place] A. [name], MD     [email or web address]          Reginald [name] is an 56 y.o. male.   Assessment/Plan: 1.  Resolved status epilepticus: I suspect this is most likely due to the abrupt cessation of antiseizure medications.  On Depakote and Keppra now.   2.  Multifactorial encephalopathy including status epilepticus and postictal encephalopathy along with medication effect. 3.  Alcoholism: It is possible that some of the seizures could be due to alcohol withdrawal but given his focal EEG findings and MRI, he will be maintained on antiseizure medications. 4.  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   No Sz.  The oxygen FiO2 is being slowly reduced.  It appears to have weaning may be attempted tomorrow.    GENERAL: He is intubated and sedated.  HEENT: No obvious trauma appreciated and neck is supple.  ABDOMEN: soft  EXTREMITIES: No edema   BACK: Normal  SKIN: Normal by inspection.    MENTAL STATUS: He opens and closes eyes opening to verbal commands.  CRANIAL NERVES: Pupils are equal, round and reactive to light; extra ocular movements are fine with oculocephalic reflexes,  upper and lower facial muscles are normal in strength and symmetric, there is no flattening of the nasolabial folds  MOTOR: Moves arms spontaneously and purposefully   COORDINATION: No clear obvious tremors or dysmetria.         Objective: Vital signs in last 24 hours: Temp:  [97.9 F (36.6 C)-99.3 F (37.4 C)] 98.1 F (36.7 C) (09/06 1548) Pulse Rate:  [65-123] 90 (09/06 1515) Resp:  [0-26] 14 (09/06 1515) BP: (94-159)/(57-114) 148/93 (09/06 1515) SpO2:  [87 %-99 %] 95 % (09/06 1515) FiO2 (%):  [50 %-55 %] 50 % (09/06 1342) Weight:  [120.7 kg] 120.7 kg (09/06 0600)  Intake/Output from previous day: 09/05 0701 - 09/06 0700 In: 1899.5 [I.V.:1364.4; NG/GT:110; IV Piggyback:425.1] Out: 3350  [Urine:3350] Intake/Output this shift: Total I/O In: 1832.1 [I.V.:475.4; IV Piggyback:1356.7] Out: 1050 [Urine:1050] Nutritional status:  Diet Order            Diet NPO time specified  Diet effective now               Lab Results: Results for orders placed or performed during the hospital encounter of 09/19/17 (from the past 48 hour(s))  Urinalysis, Routine w reflex microscopic     Status: Abnormal   Collection Time: 09/20/17 11:00 PM  Result Value Ref Range   Color, Urine YELLOW YELLOW   APPearance HAZY (A) CLEAR   Specific Gravity, Urine 1.016 1.005 - 1.030   pH 5.0 5.0 - 8.0   Glucose, UA NEGATIVE NEGATIVE mg/dL   Hgb urine dipstick SMALL (A) NEGATIVE   Bilirubin Urine NEGATIVE NEGATIVE   Ketones, ur NEGATIVE NEGATIVE mg/dL   Protein, ur NEGATIVE NEGATIVE mg/dL   Nitrite NEGATIVE NEGATIVE   Leukocytes, UA NEGATIVE NEGATIVE   RBC / HPF >50 (H) 0 - 5 RBC/hpf   WBC, UA 21-50 0 - 5 WBC/hpf   Bacteria, UA NONE SEEN NONE SEEN   Uric Acid Crys, UA PRESENT     Comment: Performed at [facility], [address] [place], [place] [address]  Glucose, capillary     Status: Abnormal   Collection Time: 09/21/17 12:18 AM  Result Value Ref Range   Glucose-Capillary 35 (LL) 70 - 99 mg/dL    Comment: QUESTIONABLE RESULTS - CHARGE CREDITED Performed at [facility], [address] [place], [place] [address]    Comment 1 Notify RN    Comment 2 Document in Chart   Glucose, capillary     Status: None   Collection Time: 09/21/17 12:20 AM  Result Value Ref Range   Glucose-Capillary 81 70 - 99 mg/dL   Comment 1 Notify RN    Comment 2 Document in Chart   Magnesium     Status: Abnormal   Collection Time: 09/21/17  4:24 AM  Result Value Ref Range   Magnesium 2.5 (H) 1.7 - 2.4 mg/dL    Comment: Performed at [facility], [address], [place], [place] [address]  Phosphorus     Status: Abnormal   Collection Time: 09/21/17  4:24 AM  Result Value Ref Range   Phosphorus 4.7 (H) 2.5 -  4.6 mg/dL    Comment: Performed at [facility], [address] [place], [place] [address]  Basic metabolic panel     Status: Abnormal   Collection Time: 09/21/17  4:25 AM  Result Value Ref Range   Sodium 142 135 - 145 mmol/L   Potassium 3.9 3.5 - 5.1 mmol/L   Chloride 111 98 - 111 mmol/L   CO2 22 22 - 32 mmol/L   Glucose, Bld 80 70 - 99 mg/dL   BUN 13 6 - 20 mg/dL   Creatinine, Ser 1.56 (H) 0.61 - 1.24 mg/dL   Calcium 8.0 (L) 8.9 - 10.3 mg/dL   GFR calc non Af Amer 48 (L) >60 mL/min   GFR calc Af Amer 56 (L) >60 mL/min    Comment: (NOTE) The eGFR has been calculated using the CKD EPI equation. This calculation has not been validated in all clinical situations. eGFR's persistently <60 mL/min signify possible Chronic Kidney Disease.    Anion gap 9 5 - 15    Comment: Performed at [facility], [address], [place], [place] [address]  Blood gas, arterial     Status: None   Collection Time: 09/21/17  4:40 AM  Result Value Ref Range   FIO2 60.00    Delivery systems VENTILATOR    Mode PRESSURE REGULATED VOLUME CONTROL    VT 650 mL   LHR 16 resp/min   Peep/cpap 5.0 cm H20   pH, Arterial 7.365 7.350 - 7.450   pCO2 arterial 43.0 32.0 - 48.0 mmHg   pO2, Arterial 84.4 83.0 - 108.0 mmHg   Bicarbonate 23.6 20.0 - 28.0 mmol/L   Acid-base deficit 0.7 0.0 - 2.0 mmol/L   O2 Saturation 95.5 %   Patient temperature 37.0    Collection site RIGHT RADIAL    Drawn by [id number]    Sample type ARTERIAL    Allens test (pass/fail) PASS PASS    Comment: Performed at [facility], [address] [place], [place] [address]  Glucose, capillary     Status: None   Collection Time: 09/21/17  6:12 AM  Result Value Ref Range   Glucose-Capillary 72 70 - 99 mg/dL   Comment 1 Notify RN    Comment 2 Document in Chart   Glucose, capillary     Status: Abnormal   Collection Time: 09/21/17  6:19 AM  Result Value Ref Range   Glucose-Capillary 67 (L) 70 - 99 mg/dL   Comment 1 Notify RN    Comment 2 Document  in Chart   CBC with Differential/Platelet     Status: Abnormal   Collection Time: 09/21/17  6:20 AM  Result Value Ref Range   WBC  11.2 (H) 4.0 - 10.5 K/uL   RBC 4.40 4.22 - 5.81 MIL/uL   Hemoglobin 11.1 (L) 13.0 - 17.0 g/dL   HCT 36.4 (L) 39.0 - 52.0 %   MCV 82.7 78.0 - 100.0 fL   MCH 25.2 (L) 26.0 - 34.0 pg   MCHC 30.5 30.0 - 36.0 g/dL   RDW 16.4 (H) 11.5 - 15.5 %   Platelets 238 150 - 400 K/uL    Comment: Performed at [facility], [address], [place], [place] [address]   Neutrophils Relative %  %    THIS TEST WAS ORDERED IN ERROR AND HAS BEEN CREDITED.   Neutro Abs  1.7 - 7.7 K/uL    THIS TEST WAS ORDERED IN ERROR AND HAS BEEN CREDITED.   Band Neutrophils  %    THIS TEST WAS ORDERED IN ERROR AND HAS BEEN CREDITED.   Lymphocytes Relative  %    THIS TEST WAS ORDERED IN ERROR AND HAS BEEN CREDITED.   Lymphs Abs  0.7 - 4.0 K/uL    THIS TEST WAS ORDERED IN ERROR AND HAS BEEN CREDITED.   Monocytes Relative  %    THIS TEST WAS ORDERED IN ERROR AND HAS BEEN CREDITED.   Monocytes Absolute  0.1 - 1.0 K/uL    THIS TEST WAS ORDERED IN ERROR AND HAS BEEN CREDITED.   Eosinophils Relative  %    THIS TEST WAS ORDERED IN ERROR AND HAS BEEN CREDITED.   Eosinophils Absolute  0.0 - 0.7 K/uL    THIS TEST WAS ORDERED IN ERROR AND HAS BEEN CREDITED.   Basophils Relative  %    THIS TEST WAS ORDERED IN ERROR AND HAS BEEN CREDITED.   Basophils Absolute  0.0 - 0.1 K/uL    THIS TEST WAS ORDERED IN ERROR AND HAS BEEN CREDITED.   WBC Morphology      THIS TEST WAS ORDERED IN ERROR AND HAS BEEN CREDITED.   RBC Morphology      THIS TEST WAS ORDERED IN ERROR AND HAS BEEN CREDITED.   Smear Review      THIS TEST WAS ORDERED IN ERROR AND HAS BEEN CREDITED.   Other  %    THIS TEST WAS ORDERED IN ERROR AND HAS BEEN CREDITED.   nRBC  0 /100 WBC    THIS TEST WAS ORDERED IN ERROR AND HAS BEEN CREDITED.   Metamyelocytes Relative  %    THIS TEST WAS ORDERED IN ERROR AND HAS BEEN CREDITED.   Myelocytes  %     THIS TEST WAS ORDERED IN ERROR AND HAS BEEN CREDITED.   Promyelocytes Relative  %    THIS TEST WAS ORDERED IN ERROR AND HAS BEEN CREDITED.   Blasts  %    THIS TEST WAS ORDERED IN ERROR AND HAS BEEN CREDITED.  Glucose, capillary     Status: None   Collection Time: 09/21/17 12:06 PM  Result Value Ref Range   Glucose-Capillary 87 70 - 99 mg/dL  Magnesium     Status: None   Collection Time: 09/21/17  5:32 PM  Result Value Ref Range   Magnesium 2.3 1.7 - 2.4 mg/dL    Comment: Performed at [facility], [address] [place], [place] [address]  Phosphorus     Status: None   Collection Time: 09/21/17  5:32 PM  Result Value Ref Range   Phosphorus 4.4 2.5 - 4.6 mg/dL    Comment: Performed at [facility], [address] [place], [place]  [address]  Glucose, capillary     Status: Abnormal   Collection Time: 09/21/17  5:56 PM  Result Value Ref Range   Glucose-Capillary 117 (H) 70 - 99 mg/dL   Comment 1 Notify RN    Comment 2 Document in Chart   Glucose, capillary     Status: None   Collection Time: 09/21/17 11:57 PM  Result Value Ref Range   Glucose-Capillary 91 70 - 99 mg/dL  Magnesium     Status: None   Collection Time: 09/22/17  4:05 AM  Result Value Ref Range   Magnesium 2.4 1.7 - 2.4 mg/dL    Comment: Performed at [facility], [address] [place], [place] [address]  Phosphorus     Status: None   Collection Time: 09/22/17  4:05 AM  Result Value Ref Range   Phosphorus 4.1 2.5 - 4.6 mg/dL    Comment: Performed at [facility], [address] [place], [place] [address]  Lactic acid, plasma     Status: None   Collection Time: 09/22/17  4:05 AM  Result Value Ref Range   Lactic Acid, Venous 1.1 0.5 - 1.9 mmol/L    Comment: Performed at [facility], [address], [place], [place] [address]  Basic metabolic panel     Status: Abnormal   Collection Time: 09/22/17  4:05 AM  Result Value Ref Range   Sodium 143 135 - 145 mmol/L   Potassium 4.1 3.5 - 5.1 mmol/L   Chloride 109 98  - 111 mmol/L   CO2 27 22 - 32 mmol/L   Glucose, Bld 102 (H) 70 - 99 mg/dL   BUN 13 6 - 20 mg/dL   Creatinine, Ser 1.13 0.61 - 1.24 mg/dL   Calcium 8.3 (L) 8.9 - 10.3 mg/dL   GFR calc non Af Amer >60 >60 mL/min   GFR calc Af Amer >60 >60 mL/min    Comment: (NOTE) The eGFR has been calculated using the CKD EPI equation. This calculation has not been validated in all clinical situations. eGFR's persistently <60 mL/min signify possible Chronic Kidney Disease.    Anion gap 7 5 - 15    Comment: Performed at [facility], [address] [place], [place] [address]  Triglycerides     Status: Abnormal   Collection Time: 09/22/17  4:06 AM  Result Value Ref Range   Triglycerides 204 (H) <150 mg/dL    Comment: Performed at [facility], [address] [place], [place] [address]  Blood gas, arterial     Status: Abnormal   Collection Time: 09/22/17  5:00 AM  Result Value Ref Range   FIO2 55.00    Delivery systems VENTILATOR    Mode PRESSURE REGULATED VOLUME CONTROL    VT 650 mL   LHR 16.0 resp/min   Peep/cpap 5.0 cm H20   pH, Arterial 7.341 (L) 7.350 - 7.450   pCO2 arterial 52.9 (H) 32.0 - 48.0 mmHg   pO2, Arterial 71.0 (L) 83.0 - 108.0 mmHg   Bicarbonate 26.0 20.0 - 28.0 mmol/L   Acid-Base Excess 2.6 (H) 0.0 - 2.0 mmol/L   O2 Saturation 91.4 %   Patient temperature 37.0    Collection site RIGHT RADIAL    Drawn by [id number]    Sample type ARTERIAL    Allens test (pass/fail) PASS PASS    Comment: Performed at [facility], [address] [place], [place] [address]  Glucose, capillary     Status: None   Collection Time: 09/22/17  6:03 AM  Result Value Ref Range   Glucose-Capillary 94 70 - 99 mg/dL  Glucose, capillary     Status: None   Collection Time: 09/22/17  9:02 AM  Result Value Ref Range   Glucose-Capillary 91 70 - 99 mg/dL   Comment 1 Notify RN   Glucose, capillary     Status: Abnormal   Collection Time: 09/22/17  3:15 PM  Result Value Ref Range   Glucose-Capillary 101 (H) 70  - 99 mg/dL   Comment 1 Notify RN     Lipid Panel Recent Labs    09/22/17 0406  TRIG 204*    Studies/Results:   Medications:  Scheduled Meds: . chlorhexidine gluconate (MEDLINE KIT)  15 mL Mouth Rinse BID  . feeding supplement (PRO-STAT SUGAR FREE 64)  60 mL Per Tube QID  . feeding supplement (VITAL HIGH PROTEIN)  1,000 mL Per Tube Q24H  . furosemide  20 mg Intravenous Q12H  . ipratropium-albuterol  3 mL Nebulization Q6H  . mouth rinse  15 mL Mouth Rinse 10 times per day  . methylPREDNISolone (SOLU-MEDROL) injection  40 mg Intravenous Q12H  . pantoprazole (PROTONIX) IV  40 mg Intravenous Q12H   Continuous Infusions: . fentaNYL 10 mcg/ml infusion 400 mcg/hr (09/22/17 1224)  . lactated ringers 25 mL/hr at 09/21/17 1919  . levETIRAcetam Stopped (09/22/17 1444)  . metronidazole Stopped (09/22/17 1328)  . midazolam (VERSED) infusion 10 mg/hr (09/22/17 1314)  . valproate sodium Stopped (09/22/17 1000)   PRN Meds:.acetaminophen **OR** acetaminophen, fentaNYL, fentaNYL (SUBLIMAZE) injection, midazolam, midazolam, midazolam, ondansetron **OR** ondansetron (ZOFRAN) IV     LOS: 3 days   Jermayne Sweeney A. [name], M.D.  Diplomate, Tax adviser of Psychiatry and Neurology ( Neurology).

## 2017-09-22 NOTE — Progress Notes (Signed)
Subjective: He is overall about the same.  He has been switched to fentanyl and Versed but still is agitated when he is stimulated.  He is now on 55% oxygen so we have been able to wean his FiO2 a little bit.  No seizures.  No frank GI bleeding.  No other new issues noted  Objective: Vital signs in last 24 hours: Temp:  [97.9 F (36.6 C)-99.4 F (37.4 C)] 99.1 F (37.3 C) (09/06 0600) Pulse Rate:  [65-123] 88 (09/06 0530) Resp:  [14-26] 20 (09/06 0530) BP: (91-156)/(56-114) 106/62 (09/06 0530) SpO2:  [88 %-100 %] 94 % (09/06 0530) FiO2 (%):  [55 %-60 %] 55 % (09/06 0436) Weight:  [120.7 kg] 120.7 kg (09/06 0600) Weight change: -1.9 kg Last BM Date: (unknown)  Intake/Output from previous day: 09/05 0701 - 09/06 0700 In: 1899.5 [I.V.:1364.4; NG/GT:110; IV Piggyback:425.1] Out: 3350 [Urine:3350]  PHYSICAL EXAM General appearance: Intubated sedated on mechanical ventilation Resp: rhonchi bilaterally Cardio: regular rate and rhythm, S1, S2 normal, no murmur, click, rub or gallop GI: soft, non-tender; bowel sounds normal; no masses,  no organomegaly Extremities: extremities normal, atraumatic, no cyanosis or edema  Lab Results:  Results for orders placed or performed during the hospital encounter of 09/19/17 (from the past 48 hour(s))  HIV antibody (Routine Testing)     Status: None   Collection Time: 09/20/17  9:38 AM  Result Value Ref Range   HIV Screen 4th Generation wRfx Non Reactive Non Reactive    Comment: (NOTE) Performed At: [facility] [place], [place] [id number] Rush Farmer MD [id number]   CBC WITH DIFFERENTIAL     Status: Abnormal   Collection Time: 09/20/17  9:38 AM  Result Value Ref Range   WBC 10.1 4.0 - 10.5 K/uL   RBC 4.60 4.22 - 5.81 MIL/uL   Hemoglobin 11.8 (L) 13.0 - 17.0 g/dL   HCT 37.6 (L) 39.0 - 52.0 %   MCV 81.7 78.0 - 100.0 fL   MCH 25.7 (L) 26.0 - 34.0 pg   MCHC 31.4 30.0 - 36.0 g/dL   RDW 16.3 (H) 11.5 - 15.5 %   Platelets 265 150 - 400 K/uL   Neutrophils Relative % 57 %   Neutro Abs 5.8 1.7 - 7.7 K/uL   Lymphocytes Relative 30 %   Lymphs Abs 3.0 0.7 - 4.0 K/uL   Monocytes Relative 12 %   Monocytes Absolute 1.2 (H) 0.1 - 1.0 K/uL   Eosinophils Relative 1 %   Eosinophils Absolute 0.1 0.0 - 0.7 K/uL   Basophils Relative 0 %   Basophils Absolute 0.0 0.0 - 0.1 K/uL    Comment: Performed at [facility], [address], [place], [place] [address]  Comprehensive metabolic panel     Status: Abnormal   Collection Time: 09/20/17  9:38 AM  Result Value Ref Range   Sodium 141 135 - 145 mmol/L   Potassium 3.4 (L) 3.5 - 5.1 mmol/L    Comment: DELTA CHECK NOTED   Chloride 109 98 - 111 mmol/L   CO2 28 22 - 32 mmol/L   Glucose, Bld 84 70 - 99 mg/dL   BUN 14 6 - 20 mg/dL   Creatinine, Ser 1.66 (H) 0.61 - 1.24 mg/dL   Calcium 8.0 (L) 8.9 - 10.3 mg/dL   Total Protein 5.9 (L) 6.5 - 8.1 g/dL   Albumin 3.1 (L) 3.5 - 5.0 g/dL   AST 43 (H) 15 - 41 U/L   ALT 30 0 - 44  U/L   Alkaline Phosphatase 54 38 - 126 U/L   Total Bilirubin 0.6 0.3 - 1.2 mg/dL   GFR calc non Af Amer 45 (L) >60 mL/min   GFR calc Af Amer 52 (L) >60 mL/min    Comment: (NOTE) The eGFR has been calculated using the CKD EPI equation. This calculation has not been validated in all clinical situations. eGFR's persistently <60 mL/min signify possible Chronic Kidney Disease.    Anion gap 4 (L) 5 - 15    Comment: Performed at [facility], [address] [place], [place] [address]  Glucose, capillary     Status: None   Collection Time: 09/20/17 11:19 AM  Result Value Ref Range   Glucose-Capillary 76 70 - 99 mg/dL  Urinalysis, Routine w reflex microscopic     Status: Abnormal   Collection Time: 09/20/17 11:00 PM  Result Value Ref Range   Color, Urine YELLOW YELLOW   APPearance HAZY (A) CLEAR   Specific Gravity, Urine 1.016 1.005 - 1.030   pH 5.0 5.0 - 8.0   Glucose, UA NEGATIVE NEGATIVE mg/dL   Hgb urine dipstick SMALL (A) NEGATIVE    Bilirubin Urine NEGATIVE NEGATIVE   Ketones, ur NEGATIVE NEGATIVE mg/dL   Protein, ur NEGATIVE NEGATIVE mg/dL   Nitrite NEGATIVE NEGATIVE   Leukocytes, UA NEGATIVE NEGATIVE   RBC / HPF >50 (H) 0 - 5 RBC/hpf   WBC, UA 21-50 0 - 5 WBC/hpf   Bacteria, UA NONE SEEN NONE SEEN   Uric Acid Crys, UA PRESENT     Comment: Performed at [facility], [address] [place], [place] [address]  Glucose, capillary     Status: Abnormal   Collection Time: 09/21/17 12:18 AM  Result Value Ref Range   Glucose-Capillary 35 (LL) 70 - 99 mg/dL   Comment 1 Notify RN    Comment 2 Document in Chart   Glucose, capillary     Status: None   Collection Time: 09/21/17 12:20 AM  Result Value Ref Range   Glucose-Capillary 81 70 - 99 mg/dL   Comment 1 Notify RN    Comment 2 Document in Chart   Magnesium     Status: Abnormal   Collection Time: 09/21/17  4:24 AM  Result Value Ref Range   Magnesium 2.5 (H) 1.7 - 2.4 mg/dL    Comment: Performed at [facility], [address] [place], [place] [address]  Phosphorus     Status: Abnormal   Collection Time: 09/21/17  4:24 AM  Result Value Ref Range   Phosphorus 4.7 (H) 2.5 - 4.6 mg/dL    Comment: Performed at  Hospital, [address] [place],  [address]  Basic metabolic panel     Status: Abnormal   Collection Time: 09/21/17  4:25 AM  Result Value Ref Range   Sodium 142 135 - 145 mmol/L   Potassium 3.9 3.5 - 5.1 mmol/L   Chloride 111 98 - 111 mmol/L   CO2 22 22 - 32 mmol/L   Glucose, Bld 80 70 - 99 mg/dL   BUN 13 6 - 20 mg/dL   Creatinine, Ser 1.56 (H) 0.61 - 1.24 mg/dL   Calcium 8.0 (L) 8.9 - 10.3 mg/dL   GFR calc non Af Amer 48 (L) >60 mL/min   GFR calc Af Amer 56 (L) >60 mL/min    Comment: (NOTE) The eGFR has been calculated using the CKD EPI equation. This calculation has not been validated in all clinical situations. eGFR's persistently <60 mL/min signify possible   Chronic Kidney Disease.    Anion gap 9 5 - 15    Comment: Performed at [facility], [address] [place], [place] [address]  Blood gas, arterial     Status: None   Collection Time: 09/21/17  4:40 AM  Result Value Ref Range   FIO2 60.00    Delivery systems VENTILATOR    Mode PRESSURE REGULATED VOLUME CONTROL    VT 650 mL   LHR 16 resp/min   Peep/cpap 5.0 cm H20   pH, Arterial 7.365 7.350 - 7.450   pCO2 arterial 43.0 32.0 - 48.0 mmHg   pO2, Arterial 84.4 83.0 - 108.0 mmHg   Bicarbonate 23.6 20.0 - 28.0 mmol/L   Acid-base deficit 0.7 0.0 - 2.0 mmol/L   O2 Saturation 95.5 %   Patient temperature 37.0    Collection site RIGHT RADIAL    Drawn by [id number]    Sample type ARTERIAL    Allens test (pass/fail) PASS PASS    Comment: Performed at [facility], [address] [place], [place] [address]  Glucose, capillary     Status: None   Collection Time: 09/21/17  6:12 AM  Result Value Ref Range   Glucose-Capillary 72 70 - 99 mg/dL   Comment 1 Notify RN    Comment 2 Document in Chart   Glucose, capillary     Status: Abnormal   Collection Time: 09/21/17  6:19 AM  Result Value Ref Range   Glucose-Capillary 67 (L) 70 - 99 mg/dL   Comment 1 Notify RN    Comment 2 Document in Chart   CBC with Differential/Platelet     Status: Abnormal   Collection Time: 09/21/17  6:20 AM  Result Value Ref Range   WBC 11.2 (H) 4.0 - 10.5 K/uL   RBC 4.40 4.22 - 5.81 MIL/uL   Hemoglobin 11.1 (L) 13.0 - 17.0 g/dL   HCT 36.4 (L) 39.0 - 52.0 %   MCV 82.7 78.0 - 100.0 fL   MCH 25.2 (L) 26.0 - 34.0 pg   MCHC 30.5 30.0 - 36.0 g/dL   RDW 16.4 (H) 11.5 - 15.5 %   Platelets 238 150 - 400 K/uL    Comment: Performed at [facility], [address] [place],  [address]   Neutrophils Relative %  %    THIS TEST WAS ORDERED IN ERROR AND HAS BEEN CREDITED.   Neutro Abs  1.7 - 7.7 K/uL    THIS TEST WAS ORDERED IN ERROR AND HAS BEEN CREDITED.   Band Neutrophils  %    THIS TEST WAS ORDERED IN ERROR AND HAS BEEN CREDITED.   Lymphocytes Relative  %    THIS TEST WAS ORDERED IN ERROR AND HAS  BEEN CREDITED.   Lymphs Abs  0.7 - 4.0 K/uL    THIS TEST WAS ORDERED IN ERROR AND HAS BEEN CREDITED.   Monocytes Relative  %    THIS TEST WAS ORDERED IN ERROR AND HAS BEEN CREDITED.   Monocytes Absolute  0.1 - 1.0 K/uL    THIS TEST WAS ORDERED IN ERROR AND HAS BEEN CREDITED.   Eosinophils Relative  %    THIS TEST WAS ORDERED IN ERROR AND HAS BEEN CREDITED.   Eosinophils Absolute  0.0 - 0.7 K/uL    THIS TEST WAS ORDERED IN ERROR AND HAS BEEN CREDITED.   Basophils Relative  %    THIS TEST WAS ORDERED IN ERROR AND HAS BEEN CREDITED.   Basophils Absolute  0.0 - 0.1   K/uL    THIS TEST WAS ORDERED IN ERROR AND HAS BEEN CREDITED.   WBC Morphology      THIS TEST WAS ORDERED IN ERROR AND HAS BEEN CREDITED.   RBC Morphology      THIS TEST WAS ORDERED IN ERROR AND HAS BEEN CREDITED.   Smear Review      THIS TEST WAS ORDERED IN ERROR AND HAS BEEN CREDITED.   Other  %    THIS TEST WAS ORDERED IN ERROR AND HAS BEEN CREDITED.   nRBC  0 /100 WBC    THIS TEST WAS ORDERED IN ERROR AND HAS BEEN CREDITED.   Metamyelocytes Relative  %    THIS TEST WAS ORDERED IN ERROR AND HAS BEEN CREDITED.   Myelocytes  %    THIS TEST WAS ORDERED IN ERROR AND HAS BEEN CREDITED.   Promyelocytes Relative  %    THIS TEST WAS ORDERED IN ERROR AND HAS BEEN CREDITED.   Blasts  %    THIS TEST WAS ORDERED IN ERROR AND HAS BEEN CREDITED.  Glucose, capillary     Status: None   Collection Time: 09/21/17 12:06 PM  Result Value Ref Range   Glucose-Capillary 87 70 - 99 mg/dL  Magnesium     Status: None   Collection Time: 09/21/17  5:32 PM  Result Value Ref Range   Magnesium 2.3 1.7 - 2.4 mg/dL    Comment: Performed at [facility], [address] [place], [place] [address]  Phosphorus     Status: None   Collection Time: 09/21/17  5:32 PM  Result Value Ref Range   Phosphorus 4.4 2.5 - 4.6 mg/dL    Comment: Performed at [facility], [address] [place], [place] [address]  Glucose, capillary     Status: Abnormal    Collection Time: 09/21/17  5:56 PM  Result Value Ref Range   Glucose-Capillary 117 (H) 70 - 99 mg/dL   Comment 1 Notify RN    Comment 2 Document in Chart   Glucose, capillary     Status: None   Collection Time: 09/21/17 11:57 PM  Result Value Ref Range   Glucose-Capillary 91 70 - 99 mg/dL  Magnesium     Status: None   Collection Time: 09/22/17  4:05 AM  Result Value Ref Range   Magnesium 2.4 1.7 - 2.4 mg/dL    Comment: Performed at [facility], [address] [place], [place] [address]  Phosphorus     Status: None   Collection Time: 09/22/17  4:05 AM  Result Value Ref Range   Phosphorus 4.1 2.5 - 4.6 mg/dL    Comment: Performed at [facility], [address] [place], [place] [address]  Lactic acid, plasma     Status: None   Collection Time: 09/22/17  4:05 AM  Result Value Ref Range   Lactic Acid, Venous 1.1 0.5 - 1.9 mmol/L    Comment: Performed at [facility], [address] [place], [place] [address]  Basic metabolic panel     Status: Abnormal   Collection Time: 09/22/17  4:05 AM  Result Value Ref Range   Sodium 143 135 - 145 mmol/L   Potassium 4.1 3.5 - 5.1 mmol/L   Chloride 109 98 - 111 mmol/L   CO2 27 22 - 32 mmol/L   Glucose, Bld 102 (H) 70 - 99 mg/dL   BUN 13 6 - 20 mg/dL   Creatinine, Ser 1.13 0.61 - 1.24 mg/dL   Calcium 8.3 (L) 8.9 -   10.3 mg/dL   GFR calc non Af Amer >60 >60 mL/min   GFR calc Af Amer >60 >60 mL/min    Comment: (NOTE) The eGFR has been calculated using the CKD EPI equation. This calculation has not been validated in all clinical situations. eGFR's persistently <60 mL/min signify possible Chronic Kidney Disease.    Anion gap 7 5 - 15    Comment: Performed at [facility], [address], [place], [place] [address]  Triglycerides     Status: Abnormal   Collection Time: 09/22/17  4:06 AM  Result Value Ref Range   Triglycerides 204 (H) <150 mg/dL    Comment: Performed at [facility], [address] [place], [place] [address]  Blood gas,  arterial     Status: Abnormal   Collection Time: 09/22/17  5:00 AM  Result Value Ref Range   FIO2 55.00    Delivery systems VENTILATOR    Mode PRESSURE REGULATED VOLUME CONTROL    VT 650 mL   LHR 16.0 resp/min   Peep/cpap 5.0 cm H20   pH, Arterial 7.341 (L) 7.350 - 7.450   pCO2 arterial 52.9 (H) 32.0 - 48.0 mmHg   pO2, Arterial 71.0 (L) 83.0 - 108.0 mmHg   Bicarbonate 26.0 20.0 - 28.0 mmol/L   Acid-Base Excess 2.6 (H) 0.0 - 2.0 mmol/L   O2 Saturation 91.4 %   Patient temperature 37.0    Collection site RIGHT RADIAL    Drawn by [id number]    Sample type ARTERIAL    Allens test (pass/fail) PASS PASS    Comment: Performed at [facility], [address] [place], [place] [address]  Glucose, capillary     Status: None   Collection Time: 09/22/17  6:03 AM  Result Value Ref Range   Glucose-Capillary 94 70 - 99 mg/dL    ABGS Recent Labs    09/22/17 0500  PHART 7.341*  PO2ART 71.0*  HCO3 26.0   CULTURES Recent Results (from the past 240 hour(s))  Urine Culture     Status: None   Collection Time: 09/19/17  1:20 AM  Result Value Ref Range Status   Specimen Description   Final    URINE, CATHETERIZED Performed at [facility], [address], [place], [place] [address]    Special Requests   Final    NONE Performed at [facility], [address], [place], [place] [address]    Culture   Final    NO GROWTH Performed at [facility] [address] [place], [place] [address]    Report Status 09/20/2017 FINAL  Final  Blood culture (routine x 2)     Status: None (Preliminary result)   Collection Time: 09/19/17  2:04 AM  Result Value Ref Range Status   Specimen Description BLOOD RIGHT HAND  Final   Special Requests   Final    BOTTLES DRAWN AEROBIC AND ANAEROBIC Blood Culture adequate volume   Culture   Final    NO GROWTH 2 DAYS Performed at [facility], [address], [place], [place] [address]    Report Status PENDING  Incomplete  Blood culture (routine x 2)     Status:  None (Preliminary result)   Collection Time: 09/19/17  2:06 AM  Result Value Ref Range Status   Specimen Description BLOOD LEFT HAND  Final   Special Requests   Final    BOTTLES DRAWN AEROBIC ONLY Blood Culture adequate volume   Culture   Final    NO GROWTH 2  DAYS Performed at [facility], [address] [place], [place] [address]    Report Status PENDING  Incomplete  MRSA PCR Screening     Status: None   Collection Time: 09/19/17  3:50 AM  Result Value Ref Range Status   MRSA by PCR NEGATIVE NEGATIVE Final    Comment:        The GeneXpert MRSA Assay (FDA approved for NASAL specimens only), is one component of a comprehensive MRSA colonization surveillance program. It is not intended to diagnose MRSA infection nor to guide or monitor treatment for MRSA infections. Performed at [facility], [address], [place], [place] [address]    Studies/Results: US Renal  Result Date: 09/21/2017 CLINICAL DATA:  Acute renal injury. EXAM: RENAL / URINARY TRACT ULTRASOUND COMPLETE COMPARISON:  No recent prior. FINDINGS: Right Kidney: Length: 13.5 cm. Echogenicity within normal limits. No mass or hydronephrosis visualized. Left Kidney: Length: 11.3 cm. Mild renal size discrepancy may be secondary to angle of measurement of the kidneys. Echogenicity within normal limits. No mass or hydronephrosis visualized. Bladder: Not visualized. IMPRESSION: No acute or focal abnormality identified. Electronically Signed   By: Marcello Moores  Register   On: 09/21/2017 15:14   Dg Chest Port 1 View  Result Date: 09/21/2017 CLINICAL DATA:  Respiratory failure EXAM: PORTABLE CHEST 1 VIEW COMPARISON:  Portable exam 0938 hours compared to 09/20/2017 FINDINGS: Tip of endotracheal tube projects 3.4 cm above carina. Nasogastric tube extends to at least the inferior mediastinum. Enlargement of cardiac silhouette with pulmonary vascular congestion. Mild RIGHT basilar atelectasis versus consolidation. Remaining lungs clear. No  gross pleural effusion or pneumothorax. IMPRESSION: Mild RIGHT basilar atelectasis versus consolidation. Electronically Signed   By: Lavonia Dana M.D.   On: 09/21/2017 11:45   Dg Chest Port 1v Same Day  Result Date: 09/21/2017 CLINICAL DATA:  Orogastric tube placement. EXAM: PORTABLE CHEST 1 VIEW COMPARISON:  09/21/2017. FINDINGS: Endotracheal tube in satisfactory position. Orogastric tube visualized at the thoracic inlet and obscured distally by the density of the overlying mediastinum. This appears to be faintly visualized in the left upper abdomen with its tip in the lateral aspect of the proximal stomach. Interval patchy opacity in the left lower lobe and medial right lung base. Previously demonstrated ill-defined opacity at the remainder of the right lung base is no longer seen. Stable mildly enlarged cardiac silhouette. Thoracic spine degenerative changes. IMPRESSION: 1. The orogastric tube is not adequately visualized with its tip most likely in the proximal stomach laterally. 2. Interval patchy pneumonia scratch the interval pneumonia or patchy atelectasis in the left lower lobe. 3. Interval pneumonia or patchy atelectasis in the medial right lung base. 4. Stable mild cardiomegaly. Electronically Signed   By: Claudie Revering M.D.   On: 09/21/2017 14:42    Medications:  Prior to Admission:  Medications Prior to Admission  Medication Sig Dispense Refill Last Dose  . Eslicarbazepine Acetate (APTIOM) 400 MG TABS Take 1 tablet by mouth 2 (two) times daily.   unknown  . levETIRAcetam (KEPPRA) 750 MG tablet Take 1,500 mg by mouth 2 (two) times daily.   1 unknown  . amLODipine (NORVASC) 10 MG tablet Take 10 mg by mouth daily.  1 03/27/2017 at Unknown time  . aspirin 81 MG chewable tablet Chew 1 tablet (81 mg total) by mouth daily. 30 tablet 0 03/27/2017 at Unknown time  . hydrochlorothiazide (HYDRODIURIL) 25 MG tablet Take 1 tablet by mouth every morning.  1 03/27/2017 at Unknown time  . LORazepam (ATIVAN) 1  MG tablet Take 1 every 6 hours if needed for minor seizures or shaking or anxiety 15 tablet 0   . olmesartan (BENICAR) 20 MG tablet Take 20 mg by mouth daily.  0 03/27/2017 at Unknown time  . Omega-3 Fatty Acids (FISH OIL) 1000 MG CAPS Take 1 capsule by mouth daily.    Not Taking at Unknown time  . phenytoin (DILANTIN) 100 MG ER capsule Take 2 capsules (200 mg total) by mouth 2 (two) times daily. (Patient not taking: Reported on 03/28/2017) 120 capsule 0 Not Taking at Unknown time  . tadalafil (CIALIS) 5 MG tablet Take 5 mg by mouth daily as needed for erectile dysfunction.   unknown   Scheduled: . chlorhexidine gluconate (MEDLINE KIT)  15 mL Mouth Rinse BID  . feeding supplement (PRO-STAT SUGAR FREE 64)  60 mL Per Tube QID  . feeding supplement (VITAL HIGH PROTEIN)  1,000 mL Per Tube Q24H  . furosemide  20 mg Intravenous Q12H  . ipratropium-albuterol  3 mL Nebulization Q6H  . mouth rinse  15 mL Mouth Rinse 10 times per day  . methylPREDNISolone (SOLU-MEDROL) injection  40 mg Intravenous Q12H  . pantoprazole (PROTONIX) IV  40 mg Intravenous Q12H   Continuous: . fentaNYL 10 mcg/ml infusion 350 mcg/hr (09/22/17 0547)  . lactated ringers 25 mL/hr at 09/21/17 1919  . levETIRAcetam Stopped (09/22/17 0259)  . metronidazole 500 mg (09/22/17 0550)  . midazolam (VERSED) infusion 10 mg/hr (09/22/17 0427)  . valproate sodium Stopped (09/21/17 2200)   DJT:[id number] **OR** acetaminophen, fentaNYL, fentaNYL (SUBLIMAZE) injection, midazolam, midazolam, midazolam, ondansetron **OR** ondansetron (ZOFRAN) IV  Assesment: He was admitted with status epilepticus and was intubated for airway protection but has had acute hypoxic respiratory failure as well.  He has history of substantial alcohol abuse and may have had some alcohol withdrawal seizures also.  He is improving but appears to have aspirated.  He is on Flagyl for that.  He is on steroids because he has some element of COPD.  Chest x-ray which I  have personally reviewed shows bilateral infiltrates  I think he has some element of volume overload and he received Lasix yesterday and was negative about 1.5 L.  There was concern about upper GI bleeding but this appears to have been fairly minor.  He had lactic acidosis which I think is from his seizures  He is still hypotensive I think mostly because of the degree of sedation he is requiring Principal Problem:   Status epilepticus ([facility]) Active Problems:   History of hypertension   Hypokalemia   Alcohol abuse   Lactic acidosis   UGI bleed    Plan: Continue to wean oxygen.  I think he is probably going to be someone who we will not be able to do traditional weaning process but once he looks like he is able to come off the ventilator I think we will simply have to extubate him.  I believe that turning down the sedation to allow him to go through the normal weaning process will result in self extubation.  He will need to be released at 45% oxygen before we can do this    LOS: 3 days   Ernie Sagrero L 09/22/2017, 7:45 AM

## 2017-09-22 NOTE — Progress Notes (Signed)
PROGRESS NOTE    Reginald [name]  MBW:[id number] DOB: 01/26/1960 DOA: 09/19/2017 PCP: Antony Contras, MD     Brief Narrative:  57 y.o. male with medical history significant of hypertension, seizure disorder alcohol abuse who was brought to the emergency department via EMS with status epilepticus.  He was intubated for airway protection.  Apparently, the patient uses cannabis for his seizures, but his wife stated to the ED nursing staff that he has not used that in the past couple days and may be the reason why he is having seizures.  After the NG tube was inserted, he draining a substantial amount of blood, which raises the possibility of UGI.  I doubt that the patient bleed down much from tongue bite.  There is no further history is available.  Assessment & Plan: 1-status epilepticus ([place]) -Intubated for airway protection -Continue IV Keppra and also Depakote -Neurology on board will follow recommendations -no active seizure appreciated.  2-acute respiratory failure: Concern for underlying COPD and also acute aspiration. -As mentioned above in the setting of a status epilepticus patient mechanically ventilated and intubated for airway protection. -FiO2 down to 55%; goal prior to extubation is 45% -continue steroids and flagyl -continue IV lasix -Appreciate pulmonology service assistance and recommendation -Continue weaning FiO2 and hopefully will be able to be extubated tomorrow.  3-History of hypertension -Blood pressure soft, but stable and overall improved. -most likely from medications. -follow VS -continue IV lasix as ordered   4-Hypokalemia -Most likely associated with alcohol abuse -Follow electrolytes trend and further replete as needed.  5-Alcohol abuse -Minimize as per wife history -Currently receiving sedation; which has been changed to midazolam for better control. -Will follow clinical response  -importance of cessation once again discussed with wife at  bedside.  6-Lactic acidosis -IVF's given -lactic acid level WNLnow  7-UGI bleed -maybe gastritis and/or blood from tongue bite -Hgb has remained stable -continue PPI  -no sign sof overt bleeding currently   8-AKI -in the setting of decrease perfusion with low BP and most likely mild rhabdomyolysis with seizure activity. -continue maintaining SBP above 100 as much as possible. -continue to minimize nephrotoxic agents -Renal function down to within normal range. -Urinalysis no suggesting infection and patient renal ultrasound without any acute or focal abnormality. -Okay to continue every 12 hours Lasix as ordered.  DVT prophylaxis: SCD's Code Status: Full code Family Communication: Wife at bedside Disposition Plan: Remains inpatient in the ICU; continue mechanical ventilator, continue weaning FIO2 (looking for a goal of 45% prior to attempt extubation).  Continue IV antiepileptic drugs and IV steroids/antibiotics. Follow clinical response.  Appreciate pulmonology and neurology recommendations.  Consultants:   Neurology  Pulmonology  GI  Procedures:   Mechanically ventilated.  See below for x-ray reports.  Antimicrobials:  Anti-infectives (From admission, onward)   Start     Dose/Rate Route Frequency Ordered Stop   09/21/17 0730  metroNIDAZOLE (FLAGYL) IVPB 500 mg     500 mg 100 mL/hr over 60 Minutes Intravenous Every 6 hours 09/21/17 0725         Subjective: No fever, remains intubated and mechanically ventilated.  Improvement in his volume status and blood pressure.  Improvement in sedation regimen after switching him to fentanyl and Versed. Decrease on FIO2 supplementation appreciated.  Objective: Vitals:   09/22/17 0530 09/22/17 0600 09/22/17 0825 09/22/17 0831  BP: 106/62     Pulse: 88     Resp: 20     Temp:  99.1 F (37.3 C)  99.3 F (37.4 C)  TempSrc:  Axillary  Axillary  SpO2: 94%  95%   Weight:  120.7 kg    Height:        Intake/Output Summary  (Last 24 hours) at 09/22/2017 0922 Last data filed at 09/22/2017 0700 Gross per 24 hour  Intake 1899.46 ml  Output 3350 ml  Net -1450.54 ml   Filed Weights   09/20/17 0500 09/21/17 0400 09/22/17 0600  Weight: 120.5 kg 122.6 kg 120.7 kg    Examination: General exam: Afebrile, slightly less agitated while using fentanyl and Versed (even is still experiencing in intermittent episodes of significant agitation when stimulated or wean off sedation).  Overall with improvement in his volume status and a stable blood pressure. Respiratory system: No wheezing, mild scattered rhonchi at and coarse breath sounds diffusely.  No breathing above ventilatory support at this time.  Oxygen saturation mid to high 90s on 55 FiO2 supplementation. Cardiovascular system:RRR. No murmurs, rubs, gallops. Gastrointestinal system: Abdomen is nondistended, soft and nontender. No organomegaly or masses felt. Normal bowel sounds heard. Central nervous system: No focal neurological deficits; moving four limbs spontaneously. Extremities: No cyanosis, no clubbing.  Trace edema bilaterally (upper extremities more than lower extremities). Skin: No rashes, lesions or ulcers Psychiatry: Unable to properly assess as the patient is sedated, intubated and mechanically ventilated.  Data Reviewed: I have personally reviewed following labs and imaging studies  CBC: Recent Labs  Lab 09/19/17 0100  09/19/17 0848 09/19/17 1448 09/19/17 1900 09/20/17 0938 09/21/17 0620  WBC 14.4*  --   --   --  12.1* 10.1 11.2*  NEUTROABS 7.3  --   --   --   --  5.8 THIS TEST WAS ORDERED IN ERROR AND HAS BEEN CREDITED.  HGB 15.4   < > 13.3 12.5* 12.2* 11.8* 11.1*  HCT 48.3   < > 41.3 38.6* 37.8* 37.6* 36.4*  MCV 83.6  --   --   --  80.8 81.7 82.7  PLT 400  --   --   --  300 265 238   < > = values in this interval not displayed.   Basic Metabolic Panel: Recent Labs  Lab 09/19/17 0100 09/19/17 0252 09/19/17 0720 09/20/17 0938  09/21/17 0424 09/21/17 0425 09/21/17 1732 09/22/17 0405  NA 140  --  139 141  --  142  --  143  K 3.2*  --  4.4 3.4*  --  3.9  --  4.1  CL 102  --  108 109  --  111  --  109  CO2 14*  --  24 28  --  22  --  27  GLUCOSE 234*  --  125* 84  --  80  --  102*  BUN 12  --  13 14  --  13  --  13  CREATININE 1.34*  --  1.22 1.66*  --  1.56*  --  1.13  CALCIUM 9.2  --  8.2* 8.0*  --  8.0*  --  8.3*  MG  --  2.5*  --   --  2.5*  --  2.3 2.4  PHOS  --  2.6  --   --  4.7*  --  4.4 4.1   GFR: Estimated Creatinine Clearance: 102 mL/min (by C-G formula based on SCr of 1.13 mg/dL).   Liver Function Tests: Recent Labs  Lab 09/19/17 0100 09/20/17 0938  AST 47* 43*  ALT 38 30  ALKPHOS 77 54  BILITOT  0.7 0.6  PROT 8.8* 5.9*  ALBUMIN 4.5 3.1*   Coagulation Profile: Recent Labs  Lab 09/19/17 0100  INR 1.14   Cardiac Enzymes: Recent Labs  Lab 09/19/17 0100  TROPONINI <0.03   CBG: Recent Labs  Lab 09/21/17 1206 09/21/17 1756 09/21/17 2357 09/22/17 0603 09/22/17 0902  GLUCAP 87 117* 91 94 91   Lipid Profile: Recent Labs    09/22/17 0406  TRIG 204*   Urine analysis:    Component Value Date/Time   COLORURINE YELLOW 09/20/2017 2300   APPEARANCEUR HAZY (A) 09/20/2017 2300   LABSPEC 1.016 09/20/2017 2300   PHURINE 5.0 09/20/2017 2300   GLUCOSEU NEGATIVE 09/20/2017 2300   HGBUR SMALL (A) 09/20/2017 2300   BILIRUBINUR NEGATIVE 09/20/2017 2300   KETONESUR NEGATIVE 09/20/2017 2300   PROTEINUR NEGATIVE 09/20/2017 2300   NITRITE NEGATIVE 09/20/2017 2300   LEUKOCYTESUR NEGATIVE 09/20/2017 2300    Recent Results (from the past 240 hour(s))  Urine Culture     Status: None   Collection Time: 09/19/17  1:20 AM  Result Value Ref Range Status   Specimen Description   Final    URINE, CATHETERIZED Performed at [facility], [address], [place], [place] [address]    Special Requests   Final    NONE Performed at [facility], [address] [place], [place] [address]     Culture   Final    NO GROWTH Performed at [facility], [place] [address], [place], [place] [address]    Report Status 09/20/2017 FINAL  Final  Blood culture (routine x 2)     Status: None (Preliminary result)   Collection Time: 09/19/17  2:04 AM  Result Value Ref Range Status   Specimen Description BLOOD RIGHT HAND  Final   Special Requests   Final    BOTTLES DRAWN AEROBIC AND ANAEROBIC Blood Culture adequate volume   Culture   Final    NO GROWTH 3 DAYS Performed at [facility], [address], [place], [place] [address]    Report Status PENDING  Incomplete  Blood culture (routine x 2)     Status: None (Preliminary result)   Collection Time: 09/19/17  2:06 AM  Result Value Ref Range Status   Specimen Description BLOOD LEFT HAND  Final   Special Requests   Final    BOTTLES DRAWN AEROBIC ONLY Blood Culture adequate volume   Culture   Final    NO GROWTH 3 DAYS Performed at [facility], [address], [place], [place] [address]    Report Status PENDING  Incomplete  MRSA PCR Screening     Status: None   Collection Time: 09/19/17  3:50 AM  Result Value Ref Range Status   MRSA by PCR NEGATIVE NEGATIVE Final    Comment:        The GeneXpert MRSA Assay (FDA approved for NASAL specimens only), is one component of a comprehensive MRSA colonization surveillance program. It is not intended to diagnose MRSA infection nor to guide or monitor treatment for MRSA infections. Performed at [facility], [address], [place], [place] [address]      Radiology Studies: US Renal  Result Date: 09/21/2017 CLINICAL DATA:  Acute renal injury. EXAM: RENAL / URINARY TRACT ULTRASOUND COMPLETE COMPARISON:  No recent prior. FINDINGS: Right Kidney: Length: 13.5 cm. Echogenicity within normal limits. No mass or hydronephrosis visualized. Left Kidney: Length: 11.3 cm. Mild renal size discrepancy may be secondary to angle of measurement of the kidneys. Echogenicity within normal  limits. No mass or  hydronephrosis visualized. Bladder: Not visualized. IMPRESSION: No acute or focal abnormality identified. Electronically Signed   By: Marcello Moores  Register   On: 09/21/2017 15:14   Dg Chest Port 1 View  Result Date: 09/22/2017 CLINICAL DATA:  Respiratory failure, history of smoking, hypertension, previous episodes of aspiration pneumonia. Intubated patient. EXAM: PORTABLE CHEST 1 VIEW COMPARISON:  Portable chest x-ray of September 21, 2017 FINDINGS: There is increasing density in the right lower lung medially. There is persistent increased density at the left lung base which has improved slightly. The heart is normal in size. The pulmonary vascularity is mildly prominent centrally. The endotracheal tube tip projects approximately 3.8 cm above the carina. The esophagogastric tube tip projects below the inferior margin of the image. IMPRESSION: Worsening right basilar atelectasis or pneumonia. Some improvement in the appearance of the left basilar atelectasis or pneumonia. Mild central pulmonary vascular prominence, stable. Electronically Signed   By: David  Martinique M.D.   On: 09/22/2017 09:19   Dg Chest Port 1 View  Result Date: 09/21/2017 CLINICAL DATA:  Respiratory failure EXAM: PORTABLE CHEST 1 VIEW COMPARISON:  Portable exam 0938 hours compared to 09/20/2017 FINDINGS: Tip of endotracheal tube projects 3.4 cm above carina. Nasogastric tube extends to at least the inferior mediastinum. Enlargement of cardiac silhouette with pulmonary vascular congestion. Mild RIGHT basilar atelectasis versus consolidation. Remaining lungs clear. No gross pleural effusion or pneumothorax. IMPRESSION: Mild RIGHT basilar atelectasis versus consolidation. Electronically Signed   By: Lavonia Dana M.D.   On: 09/21/2017 11:45   Dg Chest Port 1v Same Day  Result Date: 09/21/2017 CLINICAL DATA:  Orogastric tube placement. EXAM: PORTABLE CHEST 1 VIEW COMPARISON:  09/21/2017. FINDINGS: Endotracheal tube in satisfactory position. Orogastric  tube visualized at the thoracic inlet and obscured distally by the density of the overlying mediastinum. This appears to be faintly visualized in the left upper abdomen with its tip in the lateral aspect of the proximal stomach. Interval patchy opacity in the left lower lobe and medial right lung base. Previously demonstrated ill-defined opacity at the remainder of the right lung base is no longer seen. Stable mildly enlarged cardiac silhouette. Thoracic spine degenerative changes. IMPRESSION: 1. The orogastric tube is not adequately visualized with its tip most likely in the proximal stomach laterally. 2. Interval patchy pneumonia scratch the interval pneumonia or patchy atelectasis in the left lower lobe. 3. Interval pneumonia or patchy atelectasis in the medial right lung base. 4. Stable mild cardiomegaly. Electronically Signed   By: Claudie Revering M.D.   On: 09/21/2017 14:42    Scheduled Meds: . chlorhexidine gluconate (MEDLINE KIT)  15 mL Mouth Rinse BID  . feeding supplement (PRO-STAT SUGAR FREE 64)  60 mL Per Tube QID  . feeding supplement (VITAL HIGH PROTEIN)  1,000 mL Per Tube Q24H  . furosemide  20 mg Intravenous Q12H  . ipratropium-albuterol  3 mL Nebulization Q6H  . mouth rinse  15 mL Mouth Rinse 10 times per day  . methylPREDNISolone (SOLU-MEDROL) injection  40 mg Intravenous Q12H  . pantoprazole (PROTONIX) IV  40 mg Intravenous Q12H   Continuous Infusions: . fentaNYL 10 mcg/ml infusion 375 mcg/hr (09/22/17 0809)  . lactated ringers 25 mL/hr at 09/21/17 1919  . levETIRAcetam Stopped (09/22/17 0259)  . metronidazole 500 mg (09/22/17 0550)  . midazolam (VERSED) infusion 10 mg/hr (09/22/17 0849)  . valproate sodium 500 mg (09/22/17 0900)     LOS: 3 days    Time spent: 35 minutes.  Greater than  50% of this time was spent in direct contact with the patient, coordinating care and discussing relevant ongoing clinical issues at bedside with family member and staff.  Case has been  discussed with pulmonologist, further adjustment to his sedation and PRN drugs to assist with agitation has been provided; improvement appreciated on his FiO2 requirement down to 55%; no further seizure activity or overt GI bleeding appreciated.  Patient overall with improvement in his volume status.  Patient remains intubated and mechanically ventilated; still critically condition requiring ICU care.   Barton Dubois, MD [facility] Pager [phone]  If 7PM-7AM, please contact night-coverage [email or web address] Password [email or web address] 09/22/2017, 9:22 AM

## 2017-09-22 NOTE — Telephone Encounter (Signed)
Patient remains inpatient. Please arrange for outpatient follow up in 2 months

## 2017-09-23 LAB — BLOOD GAS, ARTERIAL
Acid-Base Excess: 7.2 mmol/L — ABNORMAL HIGH (ref 0.0–2.0)
Bicarbonate: 30.4 mmol/L — ABNORMAL HIGH (ref 20.0–28.0)
Drawn by: 105551
FIO2: 40
MECHVT: 650 mL
Mechanical Rate: 16
O2 SAT: 94.1 %
PEEP: 5 cmH2O
PH ART: 7.428 (ref 7.350–7.450)
PO2 ART: 75.3 mmHg — AB (ref 83.0–108.0)
RATE: 16 resp/min
pCO2 arterial: 48.7 mmHg — ABNORMAL HIGH (ref 32.0–48.0)

## 2017-09-23 LAB — BASIC METABOLIC PANEL
Anion gap: 12 (ref 5–15)
BUN: 19 mg/dL (ref 6–20)
CO2: 27 mmol/L (ref 22–32)
CREATININE: 1 mg/dL (ref 0.61–1.24)
Calcium: 8.6 mg/dL — ABNORMAL LOW (ref 8.9–10.3)
Chloride: 107 mmol/L (ref 98–111)
GFR calc non Af Amer: >60 mL/min (ref >60)
Glucose, Bld: 109 mg/dL — ABNORMAL HIGH (ref 70–99)
Potassium: 4.5 mmol/L (ref 3.5–5.1)
SODIUM: 146 mmol/L — AB (ref 135–145)

## 2017-09-23 LAB — GLUCOSE, CAPILLARY
GLUCOSE-CAPILLARY: 114 mg/dL — AB (ref 70–99)
Glucose-Capillary: 100 mg/dL — ABNORMAL HIGH (ref 70–99)
Glucose-Capillary: 112 mg/dL — ABNORMAL HIGH (ref 70–99)
Glucose-Capillary: 118 mg/dL — ABNORMAL HIGH (ref 70–99)
Glucose-Capillary: 123 mg/dL — ABNORMAL HIGH (ref 70–99)
Glucose-Capillary: 94 mg/dL (ref 70–99)

## 2017-09-23 MED ORDER — FENTANYL CITRATE (PF) 2500 MCG/50ML IJ SOLN
INTRAMUSCULAR | Status: AC
  Filled 2017-09-23: qty 50

## 2017-09-23 MED ORDER — ADULT MULTIVITAMIN W/MINERALS CH
1.0000 | ORAL_TABLET | Freq: Every day | ORAL | Status: DC
  Administered 2017-09-23 – 2017-09-25 (×3): 1 via ORAL
  Filled 2017-09-23 (×3): qty 1

## 2017-09-23 MED ORDER — LORAZEPAM 2 MG/ML IJ SOLN
1.0000 mg | Freq: Four times a day (QID) | INTRAMUSCULAR | Status: DC | PRN
  Administered 2017-09-23: 1 mg via INTRAVENOUS
  Filled 2017-09-23: qty 1

## 2017-09-23 MED ORDER — FUROSEMIDE 10 MG/ML IJ SOLN
20.0000 mg | Freq: Two times a day (BID) | INTRAMUSCULAR | Status: AC
  Administered 2017-09-23 – 2017-09-25 (×4): 20 mg via INTRAVENOUS
  Filled 2017-09-23 (×4): qty 2

## 2017-09-23 MED ORDER — MIDAZOLAM 50MG/50ML (1MG/ML) PREMIX INFUSION
INTRAVENOUS | Status: AC
  Filled 2017-09-23: qty 50

## 2017-09-23 MED ORDER — LORAZEPAM 1 MG PO TABS: 1.0000 mg | ORAL_TABLET | Freq: Four times a day (QID) | ORAL | Status: DC | PRN

## 2017-09-23 MED ORDER — FOLIC ACID 1 MG PO TABS
1.0000 mg | ORAL_TABLET | Freq: Every day | ORAL | Status: DC
  Administered 2017-09-23 – 2017-09-25 (×3): 1 mg via ORAL
  Filled 2017-09-23 (×3): qty 1

## 2017-09-23 MED ORDER — THIAMINE HCL 100 MG/ML IJ SOLN: 100.0000 mg | Freq: Every day | INTRAMUSCULAR | Status: DC

## 2017-09-23 MED ORDER — VITAMIN B-1 100 MG PO TABS
100.0000 mg | ORAL_TABLET | Freq: Every day | ORAL | Status: DC
  Administered 2017-09-23 – 2017-09-25 (×3): 100 mg via ORAL
  Filled 2017-09-23 (×3): qty 1

## 2017-09-23 NOTE — Progress Notes (Signed)
PROGRESS NOTE    Reginald [name]  EEF:[id number] DOB: [birth year] DOA: 09/19/2017 PCP: Antony Contras, MD     Brief Narrative:  57 y.o. male with medical history significant of hypertension, seizure disorder alcohol abuse who was brought to the emergency department via EMS with status epilepticus.  He was intubated for airway protection.  Apparently, the patient uses cannabis for his seizures, but his wife stated to the ED nursing staff that he has not used that in the past couple days and may be the reason why he is having seizures.  After the NG tube was inserted, he draining a substantial amount of blood, which raises the possibility of UGI.  I doubt that the patient bleed down much from tongue bite.  There is no further history is available.  Assessment & Plan: 1-status epilepticus ([place]) -Intubated for airway protection -Continue IV Keppra and also Depakote; transition to PO in am if mentation stable to use oral route  -Neurology on board will follow recommendations -no active seizure appreciated.  2-acute respiratory failure: Concern for underlying COPD and also acute aspiration. -As mentioned above in the setting of a status epilepticus patient mechanically ventilated and intubated for airway protection. -patient extubated and place on [place] supplementation -continue steroids and flagyl -continue IV lasix for another 4 doses.  -Appreciate pulmonology service assistance and recommendation -PRN CPAP and BIPAP.  3-History of hypertension -Blood pressure improved and stable -will monitor VS -soft due to sedation meds.   4-Hypokalemia -Most likely associated with alcohol abuse -Follow electrolytes trend and further replete as needed. -Mg stable  5-Alcohol abuse -Minimimal as per wife history; but unable to guaranteed   -will start CIWA as he is now extubated.  -importance of cessation once again discussed with wife at bedside.  6-Lactic acidosis -IVF's given -lactic acid level  WNLnow  7-UGI bleed -maybe gastritis and/or blood from tongue bite -Hgb has remained stable -continue PPI  -no signs of overt bleeding currently   8-AKI -in the setting of decrease perfusion with low BP and most likely mild rhabdomyolysis with seizure activity. -continue maintaining SBP above 100 as much as possible. -continue to minimize nephrotoxic agents -Renal function down to within normal range now. -Urinalysis no suggesting infection and patient renal ultrasound without any acute or focal abnormality. -will give lasix '20mg'$  q12 hours X 4 doses again.   DVT prophylaxis: SCD's Code Status: Full code Family Communication: Wife at bedside Disposition Plan: Remains in stepdown, as he had high risk of requiring CPAP, BiPAP for further control of his respiratory status.  Once more alert advance diet and transition medications to oral regimen.  Consultants:   Neurology  Pulmonology  GI  Procedures:   Mechanically ventilated.  See below for x-ray reports.  Antimicrobials:  Anti-infectives (From admission, onward)   Start     Dose/Rate Route Frequency Ordered Stop   09/21/17 0730  metroNIDAZOLE (FLAGYL) IVPB 500 mg     500 mg 100 mL/hr over 60 Minutes Intravenous Every 6 hours 09/21/17 0725         Subjective: No fever, patient has now been extubated and is so far tolerating well nasal cannula supplementation.  No chest pain, following simple commands but is still obtunded.  Objective: Vitals:   09/23/17 1354 09/23/17 1400 09/23/17 1500 09/23/17 1600  BP:  (!) 154/97 (!) 167/95 (!) 159/104  Pulse:  85 94 83  Resp:  '18 19 17  '$ Temp:      TempSrc:      SpO2:  94% 96% 90% 96%  Weight:      Height:        Intake/Output Summary (Last 24 hours) at 09/23/2017 1708 Last data filed at 09/23/2017 1510 Gross per 24 hour  Intake 2708.4 ml  Output 1750 ml  Net 958.4 ml   Filed Weights   09/21/17 0400 09/22/17 0600 09/23/17 0400  Weight: 122.6 kg 120.7 kg 120.7 kg     Examination: General exam: Alert, awake, oriented x 1; following simple commands.  He has been extubated this morning around 10:40, is maintaining good oxygen saturation on nasal cannula supplementation.  No chest pain, no fever. Respiratory system: Positive rhonchi, no wheezing, no using accessory muscles. Cardiovascular system:RRR. No murmurs, rubs, gallops. Gastrointestinal system: Abdomen is nondistended, soft and nontender. No organomegaly or masses felt. Normal bowel sounds heard. Central nervous system: No focal neurological deficits appreciated. Moving four limbs spontaneously. Extremities: No cyanosis, no clubbing. Trace edema bilaterally (upper and lowe extremities). Skin: No rashes, lesions or ulcers Psychiatry: Judgement and insight impaired, probably from residual sedation in his system.  Data Reviewed: I have personally reviewed following labs and imaging studies  CBC: Recent Labs  Lab 09/19/17 0100  09/19/17 0848 09/19/17 1448 09/19/17 1900 09/20/17 0938 09/21/17 0620  WBC 14.4*  --   --   --  12.1* 10.1 11.2*  NEUTROABS 7.3  --   --   --   --  5.8 THIS TEST WAS ORDERED IN ERROR AND HAS BEEN CREDITED.  HGB 15.4   < > 13.3 12.5* 12.2* 11.8* 11.1*  HCT 48.3   < > 41.3 38.6* 37.8* 37.6* 36.4*  MCV 83.6  --   --   --  80.8 81.7 82.7  PLT 400  --   --   --  300 265 238   < > = values in this interval not displayed.   Basic Metabolic Panel: Recent Labs  Lab 09/19/17 0252 09/19/17 0720 09/20/17 3810 09/21/17 0424 09/21/17 0425 09/21/17 1732 09/22/17 0405 09/22/17 1705 09/23/17 0409  NA  --  139 141  --  142  --  143  --  146*  K  --  4.4 3.4*  --  3.9  --  4.1  --  4.5  CL  --  108 109  --  111  --  109  --  107  CO2  --  24 28  --  22  --  27  --  27  GLUCOSE  --  125* 84  --  80  --  102*  --  109*  BUN  --  13 14  --  13  --  13  --  19  CREATININE  --  1.22 1.66*  --  1.56*  --  1.13  --  1.00  CALCIUM  --  8.2* 8.0*  --  8.0*  --  8.3*  --  8.6*  MG  2.5*  --   --  2.5*  --  2.3 2.4 2.5*  --   PHOS 2.6  --   --  4.7*  --  4.4 4.1 3.5  --    GFR: Estimated Creatinine Clearance: 115.2 mL/min (by C-G formula based on SCr of 1 mg/dL).   Liver Function Tests: Recent Labs  Lab 09/19/17 0100 09/20/17 0938  AST 47* 43*  ALT 38 30  ALKPHOS 77 54  BILITOT 0.7 0.6  PROT 8.8* 5.9*  ALBUMIN 4.5 3.1*   Coagulation Profile: Recent Labs  Lab  09/19/17 0100  INR 1.14   Cardiac Enzymes: Recent Labs  Lab 09/19/17 0100  TROPONINI <0.03   CBG: Recent Labs  Lab 09/22/17 0902 09/22/17 1515 09/23/17 0004 09/23/17 0609 09/23/17 1239  GLUCAP 91 101* 118* 123* 114*   Lipid Profile: Recent Labs    09/22/17 0406  TRIG 204*   Urine analysis:    Component Value Date/Time   COLORURINE YELLOW 09/20/2017 2300   APPEARANCEUR HAZY (A) 09/20/2017 2300   LABSPEC 1.016 09/20/2017 2300   PHURINE 5.0 09/20/2017 2300   GLUCOSEU NEGATIVE 09/20/2017 2300   HGBUR SMALL (A) 09/20/2017 2300   BILIRUBINUR NEGATIVE 09/20/2017 2300   KETONESUR NEGATIVE 09/20/2017 2300   PROTEINUR NEGATIVE 09/20/2017 2300   NITRITE NEGATIVE 09/20/2017 2300   LEUKOCYTESUR NEGATIVE 09/20/2017 2300    Recent Results (from the past 240 hour(s))  Urine Culture     Status: None   Collection Time: 09/19/17  1:20 AM  Result Value Ref Range Status   Specimen Description   Final    URINE, CATHETERIZED Performed at [facility], [address] [place], [place] [address]    Special Requests   Final    NONE Performed at [facility], [address], [place], [place] [address]    Culture   Final    NO GROWTH Performed at [facility], [place] [address], [place], [place] [address]    Report Status 09/20/2017 FINAL  Final  Blood culture (routine x 2)     Status: None (Preliminary result)   Collection Time: 09/19/17  2:04 AM  Result Value Ref Range Status   Specimen Description BLOOD RIGHT HAND  Final   Special Requests   Final    BOTTLES DRAWN AEROBIC AND  ANAEROBIC Blood Culture adequate volume   Culture   Final    NO GROWTH 4 DAYS Performed at [facility], [address], [place], [place] [address]    Report Status PENDING  Incomplete  Blood culture (routine x 2)     Status: None (Preliminary result)   Collection Time: 09/19/17  2:06 AM  Result Value Ref Range Status   Specimen Description BLOOD LEFT HAND  Final   Special Requests   Final    BOTTLES DRAWN AEROBIC ONLY Blood Culture adequate volume   Culture   Final    NO GROWTH 4 DAYS Performed at [facility], [address] [place], [place] [address]    Report Status PENDING  Incomplete  MRSA PCR Screening     Status: None   Collection Time: 09/19/17  3:50 AM  Result Value Ref Range Status   MRSA by PCR NEGATIVE NEGATIVE Final    Comment:        The GeneXpert MRSA Assay (FDA approved for NASAL specimens only), is one component of a comprehensive MRSA colonization surveillance program. It is not intended to diagnose MRSA infection nor to guide or monitor treatment for MRSA infections. Performed at [facility], [address], [place], [place] [address]      Radiology Studies: Dg Chest [facility] 1 View  Result Date: 09/22/2017 CLINICAL DATA:  Respiratory failure, history of smoking, hypertension, previous episodes of aspiration pneumonia. Intubated patient. EXAM: PORTABLE CHEST 1 VIEW COMPARISON:  Portable chest x-ray of September 21, 2017 FINDINGS: There is increasing density in the right lower lung medially. There is persistent increased density at the left lung base which has improved slightly. The heart is normal in size. The pulmonary vascularity is mildly prominent centrally. The endotracheal  tube tip projects approximately 3.8 cm above the carina. The esophagogastric tube tip projects below the inferior margin of the image. IMPRESSION: Worsening right basilar atelectasis or pneumonia. Some improvement in the appearance of the left basilar atelectasis or pneumonia. Mild central  pulmonary vascular prominence, stable. Electronically Signed   By: David  Martinique M.D.   On: 09/22/2017 09:19    Scheduled Meds: . chlorhexidine gluconate (MEDLINE KIT)  15 mL Mouth Rinse BID  . feeding supplement (PRO-STAT SUGAR FREE 64)  60 mL Per Tube QID  . feeding supplement (VITAL HIGH PROTEIN)  1,000 mL Per Tube Q24H  . ipratropium-albuterol  3 mL Nebulization Q6H  . methylPREDNISolone (SOLU-MEDROL) injection  40 mg Intravenous Q12H  . pantoprazole (PROTONIX) IV  40 mg Intravenous Q12H   Continuous Infusions: . fentaNYL 10 mcg/ml infusion Stopped (09/23/17 1200)  . lactated ringers 25 mL/hr at 09/21/17 1919  . levETIRAcetam Stopped (09/23/17 1520)  . metronidazole Stopped (09/23/17 1554)  . midazolam (VERSED) infusion Stopped (09/23/17 1200)  . valproate sodium Stopped (09/23/17 0950)     LOS: 4 days   Time spent: 35 minutes.  Greater than 50% of this time was spent in direct contact with the patient, coordinating care and discussing relevant ongoing clinical issues at bedside with family member and staff.  Case was also discussed with pulmonologist, patient has now been extubated and is so far maintaining good oxygen saturation with nasal cannula supplementation.  He continued to be obtunded even able to follow commands appropriately.  No signs of GI bleed, chest pain or fever.  Patient will be In the stepdown unit in case that he requires acutely BiPAP or even reintubation.   Barton Dubois, MD [facility] Pager [phone]  If 7PM-7AM, please contact night-coverage [email or web address] Password [email or web address] 09/23/2017, 5:08 PM

## 2017-09-23 NOTE — Progress Notes (Signed)
Subjective: He is overall about the same.  He is still agitated and moving around when he is stimulated.  FiO2 has been able to be reduced to 40% now.  Arterial blood gas is good on 40%  Objective: Vital signs in last 24 hours: Temp:  [98.1 F (36.7 C)-99.8 F (37.7 C)] 99.6 F (37.6 C) (09/07 0800) Pulse Rate:  [66-117] 67 (09/07 0700) Resp:  [0-21] 16 (09/07 0700) BP: (94-189)/(59-125) 114/71 (09/07 0700) SpO2:  [87 %-100 %] 100 % (09/07 0836) FiO2 (%):  [40 %-50 %] 40 % (09/07 0836) Weight:  [120.7 kg] 120.7 kg (09/07 0400) Weight change: 0 kg Last BM Date: (unknown)  Intake/Output from previous day: 09/06 0701 - 09/07 0700 In: 4303.7 [I.V.:2472; IV Piggyback:1831.7] Out: 2000 [Urine:2000]  PHYSICAL EXAM General appearance: Intubated sedated but still moving around with stimulation Resp: rhonchi bilaterally Cardio: regular rate and rhythm, S1, S2 normal, no murmur, click, rub or gallop GI: soft, non-tender; bowel sounds normal; no masses,  no organomegaly Extremities: extremities normal, atraumatic, no cyanosis or edema  Lab Results:  Results for orders placed or performed during the hospital encounter of 09/19/17 (from the past 48 hour(s))  Glucose, capillary     Status: None   Collection Time: 09/21/17 12:06 PM  Result Value Ref Range   Glucose-Capillary 87 70 - 99 mg/dL  Magnesium     Status: None   Collection Time: 09/21/17  5:32 PM  Result Value Ref Range   Magnesium 2.3 1.7 - 2.4 mg/dL    Comment: Performed at [facility], [address], [place], [place] [address]  Phosphorus     Status: None   Collection Time: 09/21/17  5:32 PM  Result Value Ref Range   Phosphorus 4.4 2.5 - 4.6 mg/dL    Comment: Performed at [facility], [address] [place], [place] [address]  Glucose, capillary     Status: Abnormal   Collection Time: 09/21/17  5:56 PM  Result Value Ref Range   Glucose-Capillary 117 (H) 70 - 99 mg/dL   Comment 1 Notify RN    Comment 2 Document in  Chart   Glucose, capillary     Status: None   Collection Time: 09/21/17 11:57 PM  Result Value Ref Range   Glucose-Capillary 91 70 - 99 mg/dL  Magnesium     Status: None   Collection Time: 09/22/17  4:05 AM  Result Value Ref Range   Magnesium 2.4 1.7 - 2.4 mg/dL    Comment: Performed at [facility], [address], [place], [place] [address]  Phosphorus     Status: None   Collection Time: 09/22/17  4:05 AM  Result Value Ref Range   Phosphorus 4.1 2.5 - 4.6 mg/dL    Comment: Performed at [facility], [address] [place], [place] [address]  Lactic acid, plasma     Status: None   Collection Time: 09/22/17  4:05 AM  Result Value Ref Range   Lactic Acid, Venous 1.1 0.5 - 1.9 mmol/L    Comment: Performed at [facility], [address] [place], [place] [address]  Basic metabolic panel     Status: Abnormal   Collection Time: 09/22/17  4:05 AM  Result Value Ref Range   Sodium 143 135 - 145 mmol/L   Potassium 4.1 3.5 - 5.1 mmol/L   Chloride 109 98 - 111 mmol/L   CO2 27 22 - 32 mmol/L   Glucose, Bld 102 (H) 70 - 99 mg/dL   BUN 13  6 - 20 mg/dL   Creatinine, Ser 1.13 0.61 - 1.24 mg/dL   Calcium 8.3 (L) 8.9 - 10.3 mg/dL   GFR calc non Af Amer >60 >60 mL/min   GFR calc Af Amer >60 >60 mL/min    Comment: (NOTE) The eGFR has been calculated using the CKD EPI equation. This calculation has not been validated in all clinical situations. eGFR's persistently <60 mL/min signify possible Chronic Kidney Disease.    Anion gap 7 5 - 15    Comment: Performed at [facility], [address] [place], [place] [address]  Triglycerides     Status: Abnormal   Collection Time: 09/22/17  4:06 AM  Result Value Ref Range   Triglycerides 204 (H) <150 mg/dL    Comment: Performed at [facility], [address], [place], [place] [address]  Blood gas, arterial     Status: Abnormal   Collection Time: 09/22/17  5:00 AM  Result Value Ref Range   FIO2 55.00    Delivery systems VENTILATOR    Mode PRESSURE  REGULATED VOLUME CONTROL    VT 650 mL   LHR 16.0 resp/min   Peep/cpap 5.0 cm H20   pH, Arterial 7.341 (L) 7.350 - 7.450   pCO2 arterial 52.9 (H) 32.0 - 48.0 mmHg   pO2, Arterial 71.0 (L) 83.0 - 108.0 mmHg   Bicarbonate 26.0 20.0 - 28.0 mmol/L   Acid-Base Excess 2.6 (H) 0.0 - 2.0 mmol/L   O2 Saturation 91.4 %   Patient temperature 37.0    Collection site RIGHT RADIAL    Drawn by [id number]    Sample type ARTERIAL    Allens test (pass/fail) PASS PASS    Comment: Performed at [facility], [address] [place], [place] [address]  Glucose, capillary     Status: None   Collection Time: 09/22/17  6:03 AM  Result Value Ref Range   Glucose-Capillary 94 70 - 99 mg/dL  Glucose, capillary     Status: None   Collection Time: 09/22/17  9:02 AM  Result Value Ref Range   Glucose-Capillary 91 70 - 99 mg/dL   Comment 1 Notify RN   Glucose, capillary     Status: Abnormal   Collection Time: 09/22/17  3:15 PM  Result Value Ref Range   Glucose-Capillary 101 (H) 70 - 99 mg/dL   Comment 1 Notify RN   Magnesium     Status: Abnormal   Collection Time: 09/22/17  5:05 PM  Result Value Ref Range   Magnesium 2.5 (H) 1.7 - 2.4 mg/dL    Comment: Performed at [facility], [address] [place], [place] [address]  Phosphorus     Status: None   Collection Time: 09/22/17  5:05 PM  Result Value Ref Range   Phosphorus 3.5 2.5 - 4.6 mg/dL    Comment: Performed at [facility], [address], [place], [place] [address]  Glucose, capillary     Status: Abnormal   Collection Time: 09/23/17 12:04 AM  Result Value Ref Range   Glucose-Capillary 118 (H) 70 - 99 mg/dL   Comment 1 Notify RN    Comment 2 Document in Chart   Basic metabolic panel     Status: Abnormal   Collection Time: 09/23/17  4:09 AM  Result Value Ref Range   Sodium 146 (H) 135 - 145 mmol/L   Potassium 4.5 3.5 - 5.1 mmol/L   Chloride 107 98 - 111 mmol/L   CO2 27 22 - 32 mmol/L   Glucose, Bld  109 (H) 70 - 99 mg/dL   BUN 19 6 - 20 mg/dL    Creatinine, Ser 1.00 0.61 - 1.24 mg/dL   Calcium 8.6 (L) 8.9 - 10.3 mg/dL   GFR calc non Af Amer >60 >60 mL/min   GFR calc Af Amer >60 >60 mL/min    Comment: (NOTE) The eGFR has been calculated using the CKD EPI equation. This calculation has not been validated in all clinical situations. eGFR's persistently <60 mL/min signify possible Chronic Kidney Disease.    Anion gap 12 5 - 15    Comment: Performed at [facility], [address], [place], [place] [address]  Blood gas, arterial     Status: Abnormal   Collection Time: 09/23/17  4:40 AM  Result Value Ref Range   FIO2 40.00    Delivery systems VENTILATOR    Mode PRESSURE REGULATED VOLUME CONTROL    VT 650 mL   LHR 16 resp/min   Peep/cpap 5.0 cm H20   pH, Arterial 7.428 7.350 - 7.450   pCO2 arterial 48.7 (H) 32.0 - 48.0 mmHg   pO2, Arterial 75.3 (L) 83.0 - 108.0 mmHg   Bicarbonate 30.4 (H) 20.0 - 28.0 mmol/L   Acid-Base Excess 7.2 (H) 0.0 - 2.0 mmol/L   O2 Saturation 94.1 %   Collection site RADIAL    Drawn by [id number]    Sample type ARTERIAL    Allens test (pass/fail) PASS PASS   Mechanical Rate 16     Comment: Performed at [facility], [address] [place], [place] [address]  Glucose, capillary     Status: Abnormal   Collection Time: 09/23/17  6:09 AM  Result Value Ref Range   Glucose-Capillary 123 (H) 70 - 99 mg/dL   Comment 1 Notify RN    Comment 2 Document in Chart     ABGS Recent Labs    09/23/17 0440  PHART 7.428  PO2ART 75.3*  HCO3 30.4*   CULTURES Recent Results (from the past 240 hour(s))  Urine Culture     Status: None   Collection Time: 09/19/17  1:20 AM  Result Value Ref Range Status   Specimen Description   Final    URINE, CATHETERIZED Performed at [facility], [address], [place], [place] [address]    Special Requests   Final    NONE Performed at [facility], [address], [place], [place] [address]    Culture   Final    NO GROWTH Performed at [facility], [place] [address], [place], [place] [address]    Report Status 09/20/2017 FINAL  Final  Blood culture (routine x 2)     Status: None (Preliminary result)   Collection Time: 09/19/17  2:04 AM  Result Value Ref Range Status   Specimen Description BLOOD RIGHT HAND  Final   Special Requests   Final    BOTTLES DRAWN AEROBIC AND ANAEROBIC Blood Culture adequate volume   Culture   Final    NO GROWTH 4 DAYS Performed at [facility], [address], [place], [place] [address]    Report Status PENDING  Incomplete  Blood culture (routine x 2)     Status: None (Preliminary result)   Collection Time: 09/19/17  2:06 AM  Result Value Ref Range Status   Specimen Description BLOOD LEFT HAND  Final   Special Requests   Final    BOTTLES DRAWN AEROBIC ONLY Blood Culture adequate volume   Culture   Final    NO  GROWTH 4 DAYS Performed at [facility], [address] [place], [place] [address]    Report Status PENDING  Incomplete  MRSA PCR Screening     Status: None   Collection Time: 09/19/17  3:50 AM  Result Value Ref Range Status   MRSA by PCR NEGATIVE NEGATIVE Final    Comment:        The GeneXpert MRSA Assay (FDA approved for NASAL specimens only), is one component of a comprehensive MRSA colonization surveillance program. It is not intended to diagnose MRSA infection nor to guide or monitor treatment for MRSA infections. Performed at [facility], [address] [place], [place] [address]    Studies/Results: US Renal  Result Date: 09/21/2017 CLINICAL DATA:  Acute renal injury. EXAM: RENAL / URINARY TRACT ULTRASOUND COMPLETE COMPARISON:  No recent prior. FINDINGS: Right Kidney: Length: 13.5 cm. Echogenicity within normal limits. No mass or hydronephrosis visualized. Left Kidney: Length: 11.3 cm. Mild renal size discrepancy may be secondary to angle of measurement of the kidneys. Echogenicity within normal limits. No mass or hydronephrosis visualized. Bladder: Not visualized. IMPRESSION: No acute or focal  abnormality identified. Electronically Signed   By: Marcello Moores  Register   On: 09/21/2017 15:14   Dg Chest Port 1 View  Result Date: 09/22/2017 CLINICAL DATA:  Respiratory failure, history of smoking, hypertension, previous episodes of aspiration pneumonia. Intubated patient. EXAM: PORTABLE CHEST 1 VIEW COMPARISON:  Portable chest x-ray of September 21, 2017 FINDINGS: There is increasing density in the right lower lung medially. There is persistent increased density at the left lung base which has improved slightly. The heart is normal in size. The pulmonary vascularity is mildly prominent centrally. The endotracheal tube tip projects approximately 3.8 cm above the carina. The esophagogastric tube tip projects below the inferior margin of the image. IMPRESSION: Worsening right basilar atelectasis or pneumonia. Some improvement in the appearance of the left basilar atelectasis or pneumonia. Mild central pulmonary vascular prominence, stable. Electronically Signed   By: David  Martinique M.D.   On: 09/22/2017 09:19   Dg Chest Port 1 View  Result Date: 09/21/2017 CLINICAL DATA:  Respiratory failure EXAM: PORTABLE CHEST 1 VIEW COMPARISON:  Portable exam 0938 hours compared to 09/20/2017 FINDINGS: Tip of endotracheal tube projects 3.4 cm above carina. Nasogastric tube extends to at least the inferior mediastinum. Enlargement of cardiac silhouette with pulmonary vascular congestion. Mild RIGHT basilar atelectasis versus consolidation. Remaining lungs clear. No gross pleural effusion or pneumothorax. IMPRESSION: Mild RIGHT basilar atelectasis versus consolidation. Electronically Signed   By: Lavonia Dana M.D.   On: 09/21/2017 11:45   Dg Chest Port 1v Same Day  Result Date: 09/21/2017 CLINICAL DATA:  Orogastric tube placement. EXAM: PORTABLE CHEST 1 VIEW COMPARISON:  09/21/2017. FINDINGS: Endotracheal tube in satisfactory position. Orogastric tube visualized at the thoracic inlet and obscured distally by the density of the  overlying mediastinum. This appears to be faintly visualized in the left upper abdomen with its tip in the lateral aspect of the proximal stomach. Interval patchy opacity in the left lower lobe and medial right lung base. Previously demonstrated ill-defined opacity at the remainder of the right lung base is no longer seen. Stable mildly enlarged cardiac silhouette. Thoracic spine degenerative changes. IMPRESSION: 1. The orogastric tube is not adequately visualized with its tip most likely in the proximal stomach laterally. 2. Interval patchy pneumonia scratch the interval pneumonia or patchy atelectasis in the left lower lobe. 3. Interval pneumonia or patchy atelectasis in the medial right lung base. 4.  Stable mild cardiomegaly. Electronically Signed   By: Claudie Revering M.D.   On: 09/21/2017 14:42    Medications:  Prior to Admission:  Medications Prior to Admission  Medication Sig Dispense Refill Last Dose  . Eslicarbazepine Acetate (APTIOM) 400 MG TABS Take 1 tablet by mouth 2 (two) times daily.   unknown  . levETIRAcetam (KEPPRA) 750 MG tablet Take 1,500 mg by mouth 2 (two) times daily.   1 unknown  . amLODipine (NORVASC) 10 MG tablet Take 10 mg by mouth daily.  1 03/27/2017 at Unknown time  . aspirin 81 MG chewable tablet Chew 1 tablet (81 mg total) by mouth daily. 30 tablet 0 03/27/2017 at Unknown time  . hydrochlorothiazide (HYDRODIURIL) 25 MG tablet Take 1 tablet by mouth every morning.  1 03/27/2017 at Unknown time  . LORazepam (ATIVAN) 1 MG tablet Take 1 every 6 hours if needed for minor seizures or shaking or anxiety 15 tablet 0   . olmesartan (BENICAR) 20 MG tablet Take 20 mg by mouth daily.  0 03/27/2017 at Unknown time  . Omega-3 Fatty Acids (FISH OIL) 1000 MG CAPS Take 1 capsule by mouth daily.    Not Taking at Unknown time  . phenytoin (DILANTIN) 100 MG ER capsule Take 2 capsules (200 mg total) by mouth 2 (two) times daily. (Patient not taking: Reported on 03/28/2017) 120 capsule 0 Not Taking  at Unknown time  . tadalafil (CIALIS) 5 MG tablet Take 5 mg by mouth daily as needed for erectile dysfunction.   unknown   Scheduled: . chlorhexidine gluconate (MEDLINE KIT)  15 mL Mouth Rinse BID  . feeding supplement (PRO-STAT SUGAR FREE 64)  60 mL Per Tube QID  . feeding supplement (VITAL HIGH PROTEIN)  1,000 mL Per Tube Q24H  . ipratropium-albuterol  3 mL Nebulization Q6H  . mouth rinse  15 mL Mouth Rinse 10 times per day  . methylPREDNISolone (SOLU-MEDROL) injection  40 mg Intravenous Q12H  . pantoprazole (PROTONIX) IV  40 mg Intravenous Q12H   Continuous: . fentaNYL 10 mcg/ml infusion 300 mcg/hr (09/23/17 0836)  . lactated ringers 25 mL/hr at 09/21/17 1919  . levETIRAcetam Stopped (09/23/17 0240)  . metronidazole Stopped (09/23/17 6073)  . midazolam (VERSED) infusion 8 mg/hr (09/23/17 0837)  . valproate sodium 500 mg (09/23/17 0845)   XTG:GYIRSWNIOEVOJ **OR** acetaminophen, fentaNYL, fentaNYL (SUBLIMAZE) injection, midazolam, midazolam, ondansetron **OR** ondansetron (ZOFRAN) IV  Assesment: He was admitted with status epilepticus and was intubated for airway protection but also has developed acute hypoxic and hypercapnic respiratory failure.  He has been on the ventilator since admission.  He has alcohol abuse and has had some element of alcohol withdrawal  He had upper GI bleed which appears to be minor  He has sleep apnea at baseline but I do not think it is clear that he was using CPAP at home.  I think with his smoking history he has some element of COPD as well which is being treated Principal Problem:   Status epilepticus ([place]) Active Problems:   History of hypertension   Hypokalemia   Alcohol abuse   Lactic acidosis   UGI bleed   Acute respiratory failure with hypoxia ([place])    Plan: Because of his agitation I do not think we are going to have an opportunity to do traditional weaning.  I think if he has his sedation turned down substantially he will self  extubate.  This morning I will have him on CPAP through the ventilator and if he  tolerates that plan to extubate.  He may need BiPAP considering his history of sleep apnea    LOS: 4 days   Tab Rylee L 09/23/2017, 9:03 AM

## 2017-09-23 NOTE — Progress Notes (Signed)
Wasted/disposed of 175 mls of fentanyl and 5 mls. Of Versed post extubation. Witnessed by Marlowe Sax RN.

## 2017-09-23 NOTE — Progress Notes (Signed)
Patient likes to sleep clinging ET tube in mouth also likes to roll to left side. It is often hard to suction Patient due to his clinging. His sedation is up Saturation 97 on 40 % now. He has rhonchi.

## 2017-09-23 NOTE — Progress Notes (Addendum)
Patient is still able with all his sedation able to roll over on his stomach. Saturation is still good. Hopefully he can be extubated.

## 2017-09-23 NOTE — Procedures (Signed)
Extubation Procedure Note  Patient extubated per Dr.Hawkins order. He was placed on a 3L [place] and had an SpO2 of 96%.   Patient Details:   Name: Reginald [name] DOB: [birth year] MRN: [id number]   Airway Documentation:    Vent end date: 09/23/17 Vent end time: 1030   Evaluation  O2 sats: stable throughout Complications: No apparent complications Patient did tolerate procedure well. Bilateral Breath Sounds: (slightly coarse BBS)     Lina Sayre 09/23/2017, 10:37 AM

## 2017-09-23 NOTE — Progress Notes (Addendum)
Patient asleep on left side at times he does cough up some pink thick secretions in et circuit. Sedation is a problem still.

## 2017-09-24 DIAGNOSIS — Z720 Tobacco use: Secondary | ICD-10-CM

## 2017-09-24 LAB — BASIC METABOLIC PANEL
Anion gap: 11 (ref 5–15)
BUN: 23 mg/dL — AB (ref 6–20)
CO2: 31 mmol/L (ref 22–32)
CREATININE: 0.86 mg/dL (ref 0.61–1.24)
Calcium: 8.9 mg/dL (ref 8.9–10.3)
Chloride: 103 mmol/L (ref 98–111)
GFR calc Af Amer: >60 mL/min (ref >60)
GFR calc non Af Amer: >60 mL/min (ref >60)
GLUCOSE: 102 mg/dL — AB (ref 70–99)
Potassium: 3.9 mmol/L (ref 3.5–5.1)
SODIUM: 145 mmol/L (ref 135–145)

## 2017-09-24 LAB — CULTURE, BLOOD (ROUTINE X 2)
CULTURE: NO GROWTH
Culture: NO GROWTH
Special Requests: ADEQUATE
Special Requests: ADEQUATE

## 2017-09-24 LAB — GLUCOSE, CAPILLARY
GLUCOSE-CAPILLARY: 115 mg/dL — AB (ref 70–99)
Glucose-Capillary: 91 mg/dL (ref 70–99)

## 2017-09-24 MED ORDER — AMLODIPINE BESYLATE 5 MG PO TABS
5.0000 mg | ORAL_TABLET | Freq: Every day | ORAL | Status: DC
  Administered 2017-09-24: 5 mg via ORAL
  Filled 2017-09-24: qty 1

## 2017-09-24 MED ORDER — SODIUM CHLORIDE 0.9 % IV SOLN: 250.0000 mL | INTRAVENOUS | Status: DC | PRN

## 2017-09-24 MED ORDER — METRONIDAZOLE 500 MG PO TABS
500.0000 mg | ORAL_TABLET | Freq: Three times a day (TID) | ORAL | Status: DC
  Administered 2017-09-24 – 2017-09-25 (×5): 500 mg via ORAL
  Filled 2017-09-24 (×5): qty 1

## 2017-09-24 MED ORDER — SODIUM CHLORIDE 0.9% FLUSH: 3.0000 mL | INTRAVENOUS | Status: DC | PRN

## 2017-09-24 MED ORDER — LEVETIRACETAM 500 MG PO TABS
1500.0000 mg | ORAL_TABLET | Freq: Two times a day (BID) | ORAL | Status: DC
  Administered 2017-09-24 – 2017-09-25 (×3): 1500 mg via ORAL
  Filled 2017-09-24 (×3): qty 3

## 2017-09-24 MED ORDER — SODIUM CHLORIDE 0.9% FLUSH
3.0000 mL | Freq: Two times a day (BID) | INTRAVENOUS | Status: DC
  Administered 2017-09-24 – 2017-09-25 (×3): 3 mL via INTRAVENOUS

## 2017-09-24 MED ORDER — DIVALPROEX SODIUM 250 MG PO DR TAB
500.0000 mg | DELAYED_RELEASE_TABLET | Freq: Two times a day (BID) | ORAL | Status: DC
  Administered 2017-09-24 – 2017-09-25 (×3): 500 mg via ORAL
  Filled 2017-09-24 (×3): qty 2

## 2017-09-24 MED ORDER — PREDNISONE 20 MG PO TABS
30.0000 mg | ORAL_TABLET | Freq: Every day | ORAL | Status: DC
  Administered 2017-09-25: 30 mg via ORAL
  Filled 2017-09-24: qty 1

## 2017-09-24 MED ORDER — PANTOPRAZOLE SODIUM 40 MG PO TBEC
40.0000 mg | DELAYED_RELEASE_TABLET | Freq: Two times a day (BID) | ORAL | Status: DC
  Administered 2017-09-24 – 2017-09-25 (×3): 40 mg via ORAL
  Filled 2017-09-24 (×3): qty 1

## 2017-09-24 MED ORDER — ORAL CARE MOUTH RINSE
15.0000 mL | Freq: Two times a day (BID) | OROMUCOSAL | Status: DC
  Administered 2017-09-24 – 2017-09-25 (×2): 15 mL via OROMUCOSAL

## 2017-09-24 NOTE — Progress Notes (Signed)
Subjective: He was able to be successfully extubated yesterday and he is done well since.  He was still confused yesterday evening but this morning he is awake and alert with no confusion.  He says he is somewhat short of breath and coughing but not coughing anything up  Objective: Vital signs in last 24 hours: Temp:  [98.1 F (36.7 C)-99.5 F (37.5 C)] 99.1 F (37.3 C) (09/08 0800) Pulse Rate:  [71-97] 71 (09/08 0800) Resp:  [14-29] 21 (09/08 0100) BP: (125-172)/(69-109) 160/103 (09/08 0800) SpO2:  [89 %-98 %] 97 % (09/08 0809) Weight:  [116.9 kg] 116.9 kg (09/08 0500) Weight change: -3.8 kg Last BM Date: (unknown)  Intake/Output from previous day: 09/07 0701 - 09/08 0700 In: 2256 [P.O.:480; I.V.:624.9; IV Piggyback:1151.1] Out: 3650 [Urine:3650]  PHYSICAL EXAM General appearance: alert, cooperative and no distress Resp: rhonchi bilaterally Cardio: regular rate and rhythm, S1, S2 normal, no murmur, click, rub or gallop GI: soft, non-tender; bowel sounds normal; no masses,  no organomegaly Extremities: extremities normal, atraumatic, no cyanosis or edema  Lab Results:  Results for orders placed or performed during the hospital encounter of 09/19/17 (from the past 48 hour(s))  Glucose, capillary     Status: Abnormal   Collection Time: 09/22/17  3:15 PM  Result Value Ref Range   Glucose-Capillary 101 (H) 70 - 99 mg/dL   Comment 1 Notify RN   Magnesium     Status: Abnormal   Collection Time: 09/22/17  5:05 PM  Result Value Ref Range   Magnesium 2.5 (H) 1.7 - 2.4 mg/dL    Comment: Performed at [facility], [address] [place], [place] [address]  Phosphorus     Status: None   Collection Time: 09/22/17  5:05 PM  Result Value Ref Range   Phosphorus 3.5 2.5 - 4.6 mg/dL    Comment: Performed at [facility], [address] [place], [place] [address]  Glucose, capillary     Status: Abnormal   Collection Time: 09/23/17 12:04 AM  Result Value Ref Range   Glucose-Capillary  118 (H) 70 - 99 mg/dL   Comment 1 Notify RN    Comment 2 Document in Chart   Basic metabolic panel     Status: Abnormal   Collection Time: 09/23/17  4:09 AM  Result Value Ref Range   Sodium 146 (H) 135 - 145 mmol/L   Potassium 4.5 3.5 - 5.1 mmol/L   Chloride 107 98 - 111 mmol/L   CO2 27 22 - 32 mmol/L   Glucose, Bld 109 (H) 70 - 99 mg/dL   BUN 19 6 - 20 mg/dL   Creatinine, Ser 1.00 0.61 - 1.24 mg/dL   Calcium 8.6 (L) 8.9 - 10.3 mg/dL   GFR calc non Af Amer >60 >60 mL/min   GFR calc Af Amer >60 >60 mL/min    Comment: (NOTE) The eGFR has been calculated using the CKD EPI equation. This calculation has not been validated in all clinical situations. eGFR's persistently <60 mL/min signify possible Chronic Kidney Disease.    Anion gap 12 5 - 15    Comment: Performed at [facility], [address] [place],  [address]  Blood gas, arterial     Status: Abnormal   Collection Time: 09/23/17  4:40 AM  Result Value Ref Range   FIO2 40.00    Delivery systems VENTILATOR    Mode PRESSURE REGULATED VOLUME CONTROL    VT 650 mL   LHR 16 resp/min   Peep/cpap 5.0 cm  H20   pH, Arterial 7.428 7.350 - 7.450   pCO2 arterial 48.7 (H) 32.0 - 48.0 mmHg   pO2, Arterial 75.3 (L) 83.0 - 108.0 mmHg   Bicarbonate 30.4 (H) 20.0 - 28.0 mmol/L   Acid-Base Excess 7.2 (H) 0.0 - 2.0 mmol/L   O2 Saturation 94.1 %   Collection site RADIAL    Drawn by [id number]    Sample type ARTERIAL    Allens test (pass/fail) PASS PASS   Mechanical Rate 16     Comment: Performed at [facility], [address] [place], [place] [address]  Glucose, capillary     Status: Abnormal   Collection Time: 09/23/17  6:09 AM  Result Value Ref Range   Glucose-Capillary 123 (H) 70 - 99 mg/dL   Comment 1 Notify RN    Comment 2 Document in Chart   Glucose, capillary     Status: Abnormal   Collection Time: 09/23/17 12:39 PM  Result Value Ref Range   Glucose-Capillary 114 (H) 70 - 99 mg/dL  Glucose, capillary     Status: Abnormal    Collection Time: 09/23/17  4:03 PM  Result Value Ref Range   Glucose-Capillary 112 (H) 70 - 99 mg/dL  Glucose, capillary     Status: None   Collection Time: 09/23/17  8:38 PM  Result Value Ref Range   Glucose-Capillary 94 70 - 99 mg/dL  Glucose, capillary     Status: Abnormal   Collection Time: 09/23/17 11:38 PM  Result Value Ref Range   Glucose-Capillary 100 (H) 70 - 99 mg/dL  Basic metabolic panel     Status: Abnormal   Collection Time: 09/24/17  4:47 AM  Result Value Ref Range   Sodium 145 135 - 145 mmol/L   Potassium 3.9 3.5 - 5.1 mmol/L   Chloride 103 98 - 111 mmol/L   CO2 31 22 - 32 mmol/L   Glucose, Bld 102 (H) 70 - 99 mg/dL   BUN 23 (H) 6 - 20 mg/dL   Creatinine, Ser 0.86 0.61 - 1.24 mg/dL   Calcium 8.9 8.9 - 10.3 mg/dL   GFR calc non Af Amer >60 >60 mL/min   GFR calc Af Amer >60 >60 mL/min    Comment: (NOTE) The eGFR has been calculated using the CKD EPI equation. This calculation has not been validated in all clinical situations. eGFR's persistently <60 mL/min signify possible Chronic Kidney Disease.    Anion gap 11 5 - 15    Comment: Performed at [facility], [address] [place], [place] [address]    ABGS Recent Labs    09/23/17 0440  PHART 7.428  PO2ART 75.3*  HCO3 30.4*   CULTURES Recent Results (from the past 240 hour(s))  Urine Culture     Status: None   Collection Time: 09/19/17  1:20 AM  Result Value Ref Range Status   Specimen Description   Final    URINE, CATHETERIZED Performed at [facility], [address] [place], [place] [address]    Special Requests   Final    NONE Performed at [facility], [address], [place], [place] [address]    Culture   Final    NO GROWTH Performed at [facility], [place] [address], [place], [place] [address]    Report Status 09/20/2017 FINAL  Final  Blood culture (routine x 2)     Status: None   Collection Time: 09/19/17  2:04 AM  Result Value Ref Range Status   Specimen Description BLOOD  RIGHT HAND  Final   Special Requests   Final    BOTTLES DRAWN AEROBIC AND ANAEROBIC Blood Culture adequate volume   Culture   Final    NO GROWTH 5 DAYS Performed at [facility], [address] [place], [place] [address]    Report Status 09/24/2017 FINAL  Final  Blood culture (routine x 2)     Status: None   Collection Time: 09/19/17  2:06 AM  Result Value Ref Range Status   Specimen Description BLOOD LEFT HAND  Final   Special Requests   Final    BOTTLES DRAWN AEROBIC ONLY Blood Culture adequate volume   Culture   Final    NO GROWTH 5 DAYS Performed at [facility], [address] [place],  [address]    Report Status 09/24/2017 FINAL  Final  MRSA PCR Screening     Status: None   Collection Time: 09/19/17  3:50 AM  Result Value Ref Range Status   MRSA by PCR NEGATIVE NEGATIVE Final    Comment:        The GeneXpert MRSA Assay (FDA approved for NASAL specimens only), is one component of a comprehensive MRSA colonization surveillance program. It is not intended to diagnose MRSA infection nor to guide or monitor treatment for MRSA infections. Performed at [facility], [address], [place],  [address]    Studies/Results: No results found.  Medications:  Prior to Admission:  Medications Prior to Admission  Medication Sig Dispense Refill Last Dose  . Eslicarbazepine Acetate (APTIOM) 400 MG TABS Take 1 tablet by mouth 2 (two) times daily.   unknown  . levETIRAcetam (KEPPRA) 750 MG tablet Take 1,500 mg by mouth 2 (two) times daily.   1 unknown  . amLODipine (NORVASC) 10 MG tablet Take 10 mg by mouth daily.  1 03/27/2017 at Unknown time  . aspirin 81 MG chewable tablet Chew 1 tablet (81 mg total) by mouth daily. 30 tablet 0 03/27/2017 at Unknown time  . hydrochlorothiazide (HYDRODIURIL) 25 MG tablet Take 1 tablet by mouth every morning.  1 03/27/2017 at Unknown time  . LORazepam (ATIVAN) 1 MG tablet Take 1 every 6 hours if needed for minor seizures or shaking or  anxiety 15 tablet 0   . olmesartan (BENICAR) 20 MG tablet Take 20 mg by mouth daily.  0 03/27/2017 at Unknown time  . Omega-3 Fatty Acids (FISH OIL) 1000 MG CAPS Take 1 capsule by mouth daily.    Not Taking at Unknown time  . phenytoin (DILANTIN) 100 MG ER capsule Take 2 capsules (200 mg total) by mouth 2 (two) times daily. (Patient not taking: Reported on 03/28/2017) 120 capsule 0 Not Taking at Unknown time  . tadalafil (CIALIS) 5 MG tablet Take 5 mg by mouth daily as needed for erectile dysfunction.   unknown   Scheduled: . amLODipine  5 mg Oral Daily  . chlorhexidine gluconate (MEDLINE KIT)  15 mL Mouth Rinse BID  . divalproex  500 mg Oral Q12H  . folic acid  1 mg Oral Daily  . furosemide  20 mg Intravenous Q12H  . ipratropium-albuterol  3 mL Nebulization Q6H  . levETIRAcetam  1,500 mg Oral BID  . metroNIDAZOLE  500 mg Oral Q8H  . multivitamin with minerals  1 tablet Oral Daily  . pantoprazole  40 mg Oral BID  . [START ON 09/25/2017] predniSONE  30 mg Oral Q breakfast  . sodium chloride flush  3 mL Intravenous Q12H  . thiamine  100  mg Oral Daily   Or  . thiamine  100 mg Intravenous Daily   Continuous: . sodium chloride     PJK:DTOIZT chloride, acetaminophen **OR** acetaminophen, LORazepam **OR** LORazepam, ondansetron **OR** ondansetron (ZOFRAN) IV, sodium chloride flush  Assesment: He was admitted with status epilepticus and was intubated for airway protection and because of acute hypoxic respiratory failure.  He has aspiration pneumonia which is being appropriately treated.  He has not had any further seizures.  He has alcoholism and was going through alcohol withdrawal and that seems better.  He had what seemed to be a relatively minor upper GI bleed with no evidence of recurrence  He had significant lactic acidosis on admission that I think is from his seizures Principal Problem:   Status epilepticus ([place]) Active Problems:   History of hypertension   Hypokalemia   Alcohol  abuse   Lactic acidosis   UGI bleed   Acute respiratory failure with hypoxia ([place])    Plan: Continue treatments.  Discussed with Dr. Dyann Kief.  Agree with plans to switch meds to oral as much as possible transfer him out of ICU.  Agree with plans to finish treatment for aspiration pneumonia and because there is some question as to whether he has COPD given his smoking history finish up another 5 days of oral prednisone.  He has sleep apnea but does not use his CPAP and I told him he needs to do that but I do not think he is going to agree.  I will follow more peripherally.  Thanks for allowing me to see him with you    LOS: 5 days   Carlyne Keehan L 09/24/2017, 9:27 AM

## 2017-09-24 NOTE — Progress Notes (Signed)
PROGRESS NOTE    Reginald [name]  NTZ:[id number] DOB: 31-Oct-1960 DOA: 09/19/2017 PCP: Antony Contras, MD     Brief Narrative:  57 y.o. male with medical history significant of hypertension, seizure disorder alcohol abuse who was brought to the emergency department via EMS with status epilepticus.  He was intubated for airway protection.  Apparently, the patient uses cannabis for his seizures, but his wife stated to the ED nursing staff that he has not used that in the past couple days and may be the reason why he is having seizures.  After the NG tube was inserted, he draining a substantial amount of blood, which raises the possibility of UGI.  I doubt that the patient bleed down much from tongue bite.  There is no further history is available.  Assessment & Plan: 1-status epilepticus ([place]) -continue keppra and depakote at current dose -seizure precautions implemented.  -Neurology on board will follow recommendations -no active seizure appreciated.  2-acute respiratory failure: Concern for underlying COPD and also acute aspiration. -As mentioned above in the setting of a status epilepticus patient mechanically ventilated and intubated for airway protection. -patient extubated and place on [place] supplementation on 9/7; so far well tolerated and progressing well -continue to wean oxygen supplementation as tolerated -complete antibiotics and steroids as recommended by pulmonologist (planning 5 more days total). -complete lasix dosages as ordered.  -Appreciate pulmonology service assistance and recommendation  3-History of hypertension -Blood pressure stable and rising -will monitor VS -will continue lasix and resume amlodipine   4-Hypokalemia -Most likely associated with alcohol abuse and poor oral intake/diuretics. -now repleted and WNL -will follow electrolytes trend and further replete as needed -Mg level WNL.  5-Alcohol abuse -Minimimal as per wife history; but unable to guaranteed    -continue CIWA protocol -cessation counseling provided -continue thiamine and folic acid  6-Lactic acidosis -most likely from seizure -IVF's resuscitation given -lactic acid level WNLnow  7-UGI bleed -maybe gastritis and/or blood from tongue bite with seizure activity -Hgb has remained stable -no signs of overt bleeding  -continue PPI orally  8-AKI -in the setting of decrease perfusion with low BP and most likely mild rhabdomyolysis with seizure activity. -renal function back to normal now -will follow trend  -continue lasix as ordered -follow I's and O's.  9-tobacco abuse -I have discussed tobacco cessation with the patient.  I have counseled the patient regarding the negative impacts of continued tobacco use including but not limited to lung cancer, COPD, and cardiovascular disease.  I have discussed alternatives to tobacco and modalities that may help facilitate tobacco cessation including but not limited to biofeedback, hypnosis, and medications. Total time spent with tobacco counseling was 5 minutes. -nicotine patch declined.  DVT prophylaxis: SCD's Code Status: Full code Family Communication: Wife at bedside Disposition Plan: will transfer to med-surg, continue CIWA, wean oxygen as tolerated. Transition all meds to PO and advance diet. PT to see him in am.   Consultants:   Neurology  Pulmonology  GI  Procedures:   Mechanically ventilated.  See below for x-ray reports.  Antimicrobials:  Anti-infectives (From admission, onward)   Start     Dose/Rate Route Frequency Ordered Stop   09/24/17 0915  metroNIDAZOLE (FLAGYL) tablet 500 mg     500 mg Oral Every 8 hours 09/24/17 0908     09/21/17 0730  metroNIDAZOLE (FLAGYL) IVPB 500 mg  Status:  Discontinued     500 mg 100 mL/hr over 60 Minutes Intravenous Every 6 hours 09/21/17 0725 09/24/17 0908  Subjective: Afebrile, breathing much better, oriented X2, in no acute distress and without significant signs  of withdrawal or agitation. No seizure seen. Good O2 sat on 2-3 L [place].  Objective: Vitals:   09/24/17 0600 09/24/17 0700 09/24/17 0800 09/24/17 0809  BP: (!) 167/108 (!) 148/100 (!) 160/103   Pulse: 74 75 71   Resp:      Temp:   99.1 F (37.3 C)   TempSrc:   Oral   SpO2: 98% 94% 96% 97%  Weight:      Height:        Intake/Output Summary (Last 24 hours) at 09/24/2017 0914 Last data filed at 09/24/2017 0551 Gross per 24 hour  Intake 2255.99 ml  Output 3650 ml  Net -1394.01 ml   Filed Weights   09/22/17 0600 09/23/17 0400 09/24/17 0500  Weight: 120.7 kg 120.7 kg 116.9 kg    Examination: General exam: Alert, awake, oriented x 2; following commands, denying CP and very hungry. Has tolerated PO meds and sips of water without signs of aspiration. No seizure appreciated. No major withdrawal symptoms seen currently.  Respiratory system: positive rhonchi, improved air movement, mild exp wheezing.  Cardiovascular system:RRR. No murmurs, rubs, gallops. No JVD. Gastrointestinal system: Abdomen is nondistended, soft and nontender. No organomegaly or masses felt. Normal bowel sounds heard. Central nervous system: Alert and oriented X 2. No focal neurological deficits. Extremities: No cyanosis, no clubbing, trace edema bilaterally (upper and lower extremities; significantly improved). Skin: No rashes, lesions or ulcers Psychiatry: Mood & affect appropriate. No agitation or hallucinations.   Data Reviewed: I have personally reviewed following labs and imaging studies  CBC: Recent Labs  Lab 09/19/17 0100  09/19/17 0848 09/19/17 1448 09/19/17 1900 09/20/17 0938 09/21/17 0620  WBC 14.4*  --   --   --  12.1* 10.1 11.2*  NEUTROABS 7.3  --   --   --   --  5.8 THIS TEST WAS ORDERED IN ERROR AND HAS BEEN CREDITED.  HGB 15.4   < > 13.3 12.5* 12.2* 11.8* 11.1*  HCT 48.3   < > 41.3 38.6* 37.8* 37.6* 36.4*  MCV 83.6  --   --   --  80.8 81.7 82.7  PLT 400  --   --   --  300 265 238   < > =  values in this interval not displayed.   Basic Metabolic Panel: Recent Labs  Lab 09/19/17 0252  09/20/17 4540 09/21/17 0424 09/21/17 0425 09/21/17 1732 09/22/17 0405 09/22/17 1705 09/23/17 0409 09/24/17 0447  NA  --    < > 141  --  142  --  143  --  146* 145  K  --    < > 3.4*  --  3.9  --  4.1  --  4.5 3.9  CL  --    < > 109  --  111  --  109  --  107 103  CO2  --    < > 28  --  22  --  27  --  27 31  GLUCOSE  --    < > 84  --  80  --  102*  --  109* 102*  BUN  --    < > 14  --  13  --  13  --  19 23*  CREATININE  --    < > 1.66*  --  1.56*  --  1.13  --  1.00 0.86  CALCIUM  --    < >  8.0*  --  8.0*  --  8.3*  --  8.6* 8.9  MG 2.5*  --   --  2.5*  --  2.3 2.4 2.5*  --   --   PHOS 2.6  --   --  4.7*  --  4.4 4.1 3.5  --   --    < > = values in this interval not displayed.   GFR: Estimated Creatinine Clearance: 131.9 mL/min (by C-G formula based on SCr of 0.86 mg/dL).   Liver Function Tests: Recent Labs  Lab 09/19/17 0100 09/20/17 0938  AST 47* 43*  ALT 38 30  ALKPHOS 77 54  BILITOT 0.7 0.6  PROT 8.8* 5.9*  ALBUMIN 4.5 3.1*   Coagulation Profile: Recent Labs  Lab 09/19/17 0100  INR 1.14   Cardiac Enzymes: Recent Labs  Lab 09/19/17 0100  TROPONINI <0.03   CBG: Recent Labs  Lab 09/23/17 0609 09/23/17 1239 09/23/17 1603 09/23/17 2038 09/23/17 2338  GLUCAP 123* 114* 112* 94 100*   Lipid Profile: Recent Labs    09/22/17 0406  TRIG 204*   Urine analysis:    Component Value Date/Time   COLORURINE YELLOW 09/20/2017 2300   APPEARANCEUR HAZY (A) 09/20/2017 2300   LABSPEC 1.016 09/20/2017 2300   PHURINE 5.0 09/20/2017 2300   GLUCOSEU NEGATIVE 09/20/2017 2300   HGBUR SMALL (A) 09/20/2017 2300   BILIRUBINUR NEGATIVE 09/20/2017 2300   KETONESUR NEGATIVE 09/20/2017 2300   PROTEINUR NEGATIVE 09/20/2017 2300   NITRITE NEGATIVE 09/20/2017 2300   LEUKOCYTESUR NEGATIVE 09/20/2017 2300    Recent Results (from the past 240 hour(s))  Urine Culture      Status: None   Collection Time: 09/19/17  1:20 AM  Result Value Ref Range Status   Specimen Description   Final    URINE, CATHETERIZED Performed at [facility], [address] [place], [place] [address]    Special Requests   Final    NONE Performed at [facility], [address], [place], [place] [address]    Culture   Final    NO GROWTH Performed at [facility], [place] [address], [place], [place] [address]    Report Status 09/20/2017 FINAL  Final  Blood culture (routine x 2)     Status: None   Collection Time: 09/19/17  2:04 AM  Result Value Ref Range Status   Specimen Description BLOOD RIGHT HAND  Final   Special Requests   Final    BOTTLES DRAWN AEROBIC AND ANAEROBIC Blood Culture adequate volume   Culture   Final    NO GROWTH 5 DAYS Performed at [facility], [address] [place], [place] [address]    Report Status 09/24/2017 FINAL  Final  Blood culture (routine x 2)     Status: None   Collection Time: 09/19/17  2:06 AM  Result Value Ref Range Status   Specimen Description BLOOD LEFT HAND  Final   Special Requests   Final    BOTTLES DRAWN AEROBIC ONLY Blood Culture adequate volume   Culture   Final    NO GROWTH 5 DAYS Performed at [facility], [address] [place], [place] [address]    Report Status 09/24/2017 FINAL  Final  MRSA PCR Screening     Status: None   Collection Time: 09/19/17  3:50 AM  Result Value Ref Range Status   MRSA by PCR NEGATIVE NEGATIVE Final    Comment:        The GeneXpert MRSA Assay (FDA  approved for NASAL specimens only), is one component of a comprehensive MRSA colonization surveillance program. It is not intended to diagnose MRSA infection nor to guide or monitor treatment for MRSA infections. Performed at [facility], [address] [place], [place] [address]     Radiology Studies: No results found.  Scheduled Meds: . amLODipine  5 mg Oral Daily  . chlorhexidine gluconate (MEDLINE KIT)  15 mL Mouth Rinse BID  .  divalproex  500 mg Oral Q12H  . folic acid  1 mg Oral Daily  . furosemide  20 mg Intravenous Q12H  . ipratropium-albuterol  3 mL Nebulization Q6H  . levETIRAcetam  1,500 mg Oral BID  . metroNIDAZOLE  500 mg Oral Q8H  . multivitamin with minerals  1 tablet Oral Daily  . pantoprazole  40 mg Oral BID  . [START ON 09/25/2017] predniSONE  30 mg Oral Q breakfast  . sodium chloride flush  3 mL Intravenous Q12H  . thiamine  100 mg Oral Daily   Or  . thiamine  100 mg Intravenous Daily   Continuous Infusions: . sodium chloride       LOS: 5 days   Time spent: 30 minutes   Barton Dubois, MD [facility] Pager [phone]  If 7PM-7AM, please contact night-coverage [email or web address] Password [facility] 09/24/2017, 9:14 AM

## 2017-09-25 ENCOUNTER — Encounter: Payer: Self-pay | Admitting: Gastroenterology

## 2017-09-25 ENCOUNTER — Other Ambulatory Visit: Payer: Self-pay

## 2017-09-25 DIAGNOSIS — N179 Acute kidney failure, unspecified: Secondary | ICD-10-CM

## 2017-09-25 DIAGNOSIS — Z8679 Personal history of other diseases of the circulatory system: Secondary | ICD-10-CM

## 2017-09-25 DIAGNOSIS — E872 Acidosis: Secondary | ICD-10-CM

## 2017-09-25 DIAGNOSIS — E876 Hypokalemia: Secondary | ICD-10-CM

## 2017-09-25 DIAGNOSIS — J9601 Acute respiratory failure with hypoxia: Secondary | ICD-10-CM

## 2017-09-25 DIAGNOSIS — K2970 Gastritis, unspecified, without bleeding: Secondary | ICD-10-CM

## 2017-09-25 DIAGNOSIS — J690 Pneumonitis due to inhalation of food and vomit: Secondary | ICD-10-CM

## 2017-09-25 LAB — GLUCOSE, CAPILLARY
GLUCOSE-CAPILLARY: 109 mg/dL — AB (ref 70–99)
GLUCOSE-CAPILLARY: 100 mg/dL — AB (ref 70–99)
Glucose-Capillary: 113 mg/dL — ABNORMAL HIGH (ref 70–99)

## 2017-09-25 LAB — CBC
HCT: 40.1 % (ref 39.0–52.0)
Hemoglobin: 12.9 g/dL — ABNORMAL LOW (ref 13.0–17.0)
MCH: 26.3 pg (ref 26.0–34.0)
MCHC: 32.2 g/dL (ref 30.0–36.0)
MCV: 81.8 fL (ref 78.0–100.0)
PLATELETS: 216 10*3/uL (ref 150–400)
RBC: 4.9 MIL/uL (ref 4.22–5.81)
RDW: 15.3 % (ref 11.5–15.5)
WBC: 16 10*3/uL — ABNORMAL HIGH (ref 4.0–10.5)

## 2017-09-25 LAB — TRIGLYCERIDES: TRIGLYCERIDES: 163 mg/dL — AB (ref <150)

## 2017-09-25 LAB — BASIC METABOLIC PANEL
Anion gap: 8 (ref 5–15)
BUN: 23 mg/dL — ABNORMAL HIGH (ref 6–20)
CALCIUM: 8.8 mg/dL — AB (ref 8.9–10.3)
CO2: 33 mmol/L — AB (ref 22–32)
CREATININE: 1.02 mg/dL (ref 0.61–1.24)
Chloride: 99 mmol/L (ref 98–111)
GFR calc non Af Amer: >60 mL/min (ref >60)
GLUCOSE: 101 mg/dL — AB (ref 70–99)
Potassium: 2.9 mmol/L — ABNORMAL LOW (ref 3.5–5.1)
Sodium: 140 mmol/L (ref 135–145)

## 2017-09-25 LAB — MAGNESIUM: Magnesium: 2.2 mg/dL (ref 1.7–2.4)

## 2017-09-25 MED ORDER — ENSURE ENLIVE PO LIQD: 237.0000 mL | Freq: Two times a day (BID) | ORAL | 0 refills | Status: AC

## 2017-09-25 MED ORDER — POTASSIUM CHLORIDE CRYS ER 20 MEQ PO TBCR
40.0000 meq | EXTENDED_RELEASE_TABLET | ORAL | Status: DC
  Administered 2017-09-25 (×2): 40 meq via ORAL
  Filled 2017-09-25 (×2): qty 2

## 2017-09-25 MED ORDER — POTASSIUM CHLORIDE CRYS ER 20 MEQ PO TBCR: 20.0000 meq | EXTENDED_RELEASE_TABLET | Freq: Every day | ORAL | 0 refills | Status: AC

## 2017-09-25 MED ORDER — METRONIDAZOLE 500 MG PO TABS: 500.0000 mg | ORAL_TABLET | Freq: Three times a day (TID) | ORAL | 0 refills | Status: AC

## 2017-09-25 MED ORDER — PANTOPRAZOLE SODIUM 40 MG PO TBEC: 40.0000 mg | DELAYED_RELEASE_TABLET | Freq: Every day | ORAL | 1 refills | Status: AC

## 2017-09-25 MED ORDER — LEVETIRACETAM 750 MG PO TABS: 1500.0000 mg | ORAL_TABLET | Freq: Two times a day (BID) | ORAL | 3 refills | Status: DC

## 2017-09-25 MED ORDER — ADULT MULTIVITAMIN W/MINERALS CH: 1.0000 | ORAL_TABLET | Freq: Every day | ORAL | 1 refills | Status: AC

## 2017-09-25 MED ORDER — ENSURE ENLIVE PO LIQD
237.0000 mL | Freq: Two times a day (BID) | ORAL | Status: DC
  Administered 2017-09-25: 237 mL via ORAL

## 2017-09-25 MED ORDER — PREDNISONE 10 MG PO TABS: 30.0000 mg | ORAL_TABLET | Freq: Every day | ORAL | 0 refills | Status: AC

## 2017-09-25 MED ORDER — MAGNESIUM OXIDE 400 MG PO TABS: 400.0000 mg | ORAL_TABLET | Freq: Every day | ORAL | 0 refills | Status: AC

## 2017-09-25 MED ORDER — IRBESARTAN 150 MG PO TABS
150.0000 mg | ORAL_TABLET | Freq: Every day | ORAL | Status: DC
  Administered 2017-09-25: 150 mg via ORAL
  Filled 2017-09-25: qty 1

## 2017-09-25 MED ORDER — DIVALPROEX SODIUM 500 MG PO DR TAB: 500.0000 mg | DELAYED_RELEASE_TABLET | Freq: Two times a day (BID) | ORAL | 3 refills | Status: AC

## 2017-09-25 MED ORDER — AMLODIPINE BESYLATE 5 MG PO TABS
10.0000 mg | ORAL_TABLET | Freq: Every day | ORAL | Status: DC
  Administered 2017-09-25: 10 mg via ORAL
  Filled 2017-09-25: qty 2

## 2017-09-25 NOTE — Telephone Encounter (Signed)
PATIENT SCHEDULED AND LETTER SENT  °

## 2017-09-25 NOTE — Discharge Summary (Signed)
Physician Discharge Summary  Reginald [name] GNF:[id number] DOB: 1960/04/20 DOA: 09/19/2017  PCP: Tally Joe, MD  Admit date: 09/19/2017 Discharge date: 09/25/2017  Time spent: 35 minutes  Recommendations for Outpatient Follow-up:  1. Repeat BMET to follow electrolytes and renal function  2. Reassess BP and adjust antihypertensive regimen  3. Please arrange for sleep study and PFT's as an outpatient.   Discharge Diagnoses:    Status epilepticus (HCC)   History of hypertension   Hypokalemia   Alcohol abuse   Lactic acidosis   Acute respiratory failure with hypoxia (HCC)   AKI (acute kidney injury) (HCC)   Gastritis   Physical deconditioning    Presumed underlying COPD/sleep apnea    Aspiration PNA.   Discharge Condition: stable and improved. Discharge home with arranged HHPT. Patient instrcuted to follow up with PCP and with neurology at discharge.   Diet recommendation: heart healthy diet   Filed Weights   09/23/17 0400 09/24/17 0500 09/25/17 0519  Weight: 120.7 kg 116.9 kg 116.6 kg    History of present illness:  As per H&P written by Dr. Robb Matar on 09/19/17 57 y.o. male with medical history significant of hypertension, seizure disorder alcohol abuse who was brought to the emergency department via EMS with status epilepticus.  He was intubated for airway protection.  Apparently, the patient uses cannabis for his seizures, but his wife stated to the ED nursing staff that he has not used that in the past couple days and may be the reason why he is having seizures.  After the NG tube was inserted, he draining a substantial amount of blood, which raises the possibility of UGI.   Hospital Course:  1-status epilepticus (HCC) -continue keppra and depakote at current dose -seizure precautions provided. -Neurology will follow patient at discharge in 3-4 weeks.  -no further seizure activities appreciated while hospitalized.  -patient instructed to stop use of CBD and  alcohol.  2-acute respiratory failure: Concern for underlying COPD and also acute aspiration. -As mentioned above in the setting of a status epilepticus patient mechanically ventilated and intubated for airway protection; successfully extubated on 09/23/17. -currently not requiring any oxygen supplementation and is no wheezing.  -complete antibiotics and steroids as recommended by pulmonologist (planning 5 days total; 4 days pending at discharge). -he also completed lasix therapy for any vascular congestion component from fluid resuscitation intervention.  -Appreciate pulmonology service assistance and recommendations; patient would benefit of repeat sleep study and PFT's as an outpatient.  3-History of hypertension -Blood pressure with fair control now. -will resume home antihypertensive regimen -Follow BP and further adjust treatment as needed.   4-Hypokalemia -Most likely associated with alcohol abuse and poor oral intake/diuretics. -repleted -patient also discharge on potassium and magnesium supplementation. -advised to quit alcohol consumption   5-Alcohol abuse -Minimimal as per wife history; but unable to guaranteed   -no active withdrawal symptoms at discharge -cessation counseling provided -continue Multivitamins daily to supplement thiamine and folic acid  6-Lactic acidosis -most likely from seizure -IVF's resuscitation given -lactic acid level WNL now  7-concerns for UGI bleed -maybe gastritis and/or blood from tongue bite with seizure activity -Hgb has remained stable -no signs of overt bleeding  -continue PPI orally -patient advised to stop alcohol and tobacco abuse.   8-AKI -in the setting of decrease perfusion with low BP and most likely mild rhabdomyolysis with seizure activity. -renal function back to normal now -will recommend BMET to follow trend  -instructed to keep himself well hydrated -safe to resume HCTZ  and Benicar now.   9-tobacco  abuse -extensive discussion provided on 09/24/17 -nicotine patch declined. -patient reported looking to quit.   10-physical deconditioning -HHPT arranged at discharge -DME rolling walker ordered.    Procedures:  See below for x-ray reports   Required mechanically ventilation  Consultations:  Neurology  Pulmonary service  GI  Discharge Exam: Vitals:   09/25/17 0752 09/25/17 0814  BP:    Pulse: 64   Resp:    Temp: 98.9 F (37.2 C)   SpO2: 92% 93%   General exam: Alert, awake, oriented x 3; following commands, denying CP and not requiring oxygen supplementation. No abd pain, tolerating diet and stable for discharge. No further seizure activity appreciated.  Respiratory system: positive rhonchi, improved air movement bilaterally, mild exp wheezing.  Cardiovascular system:RRR. No murmurs, rubs or gallops. No JVD. Gastrointestinal system: Abdomen is nondistended, soft and nontender. No organomegaly or masses felt. Normal bowel sounds heard. Central nervous system: Alert and oriented X 3. No focal neurological deficits. Extremities: No cyanosis, no clubbing, no edema appreciated on exam currently.  Skin: No rashes, lesions or ulcers Psychiatry: Mood & affect appropriate. No agitation or hallucinations.  Discharge Instructions   Discharge Instructions    Diet - low sodium heart healthy   Complete by:  As directed    Discharge instructions   Complete by:  As directed    Take medications as prescribed  Stop using alcohol, CBD and tobacco products. Maintain adequate hydration  Follow heart healthy diet Follow up with neurology service in 3-4 weeks Follow up with PCP in 10 days   Driving Restrictions   Complete by:  As directed    Especially heavy machinery   Increase activity slowly   Complete by:  As directed      Allergies as of 09/25/2017      Reactions   Penicillins Itching   Has patient had a PCN reaction causing immediate rash, facial/tongue/throat swelling,  SOB or lightheadedness with hypotension: no Has patient had a PCN reaction causing severe rash involving mucus membranes or skin necrosis: no Has patient had a PCN reaction that required hospitalization: no Has patient had a PCN reaction occurring within the last 10 years: o If all of the above answers are "NO", then may proceed with Cephalosporin       Medication List    STOP taking these medications   APTIOM 400 MG Tabs Generic drug:  Eslicarbazepine Acetate   phenytoin 100 MG ER capsule Commonly known as:  DILANTIN     TAKE these medications   amLODipine 10 MG tablet Commonly known as:  NORVASC Take 10 mg by mouth daily.   aspirin 81 MG chewable tablet Chew 1 tablet (81 mg total) by mouth daily.   divalproex 500 MG DR tablet Commonly known as:  DEPAKOTE Take 1 tablet (500 mg total) by mouth every 12 (twelve) hours.   feeding supplement (ENSURE ENLIVE) Liqd Take 237 mLs by mouth 2 (two) times daily between meals for 15 days.   Fish Oil 1000 MG Caps Take 1 capsule by mouth daily.   hydrochlorothiazide 25 MG tablet Commonly known as:  HYDRODIURIL Take 1 tablet by mouth every morning.   levETIRAcetam 750 MG tablet Commonly known as:  KEPPRA Take 2 tablets (1,500 mg total) by mouth 2 (two) times daily.   LORazepam 1 MG tablet Commonly known as:  ATIVAN Take 1 every 6 hours if needed for minor seizures or shaking or anxiety   magnesium oxide 400  MG tablet Commonly known as:  MAG-OX Take 1 tablet (400 mg total) by mouth daily.   metroNIDAZOLE 500 MG tablet Commonly known as:  FLAGYL Take 1 tablet (500 mg total) by mouth every 8 (eight) hours for 4 days.   multivitamin with minerals Tabs tablet Take 1 tablet by mouth daily. Start taking on:  09/26/2017   olmesartan 20 MG tablet Commonly known as:  BENICAR Take 20 mg by mouth daily.   pantoprazole 40 MG tablet Commonly known as:  PROTONIX Take 1 tablet (40 mg total) by mouth daily.   potassium chloride SA  20 MEQ tablet Commonly known as:  K-DUR,KLOR-CON Take 1 tablet (20 mEq total) by mouth daily.   predniSONE 10 MG tablet Commonly known as:  DELTASONE Take 3 tablets (30 mg total) by mouth daily with breakfast for 4 days. Start taking on:  09/26/2017   tadalafil 5 MG tablet Commonly known as:  CIALIS Take 5 mg by mouth daily as needed for erectile dysfunction.            Durable Medical Equipment  (From admission, onward)         Start     Ordered   09/25/17 1020  For home use only DME Walker rolling  [facility])  Once    Question:  Patient needs a walker to treat with the following condition  Answer:  Physical deconditioning   09/25/17 1022         Allergies  Allergen Reactions  . Penicillins Itching    Has patient had a PCN reaction causing immediate rash, facial/tongue/throat swelling, SOB or lightheadedness with hypotension: no Has patient had a PCN reaction causing severe rash involving mucus membranes or skin necrosis: no Has patient had a PCN reaction that required hospitalization: no Has patient had a PCN reaction occurring within the last 10 years: no If all of the above answers are "NO", then may proceed with Cephalosporin    Follow-up Information    Tally Joe, MD. Schedule an appointment as soon as possible for a visit in 10 day(s).   Specialty:  Family Medicine Contact information: [address], [address] [place] [address] [phone]        Beryle Beams, MD. Schedule an appointment as soon as possible for a visit in 4 week(s).   Specialty:  Neurology Contact information: [address] [place] [address] [phone]           The results of significant diagnostics from this hospitalization (including imaging, microbiology, ancillary and laboratory) are listed below for reference.    Significant Diagnostic Studies: Ct Head Wo Contrast  Result Date: 09/19/2017 CLINICAL DATA:  57 year old male with seizure and  encephalopathy. EXAM: CT HEAD WITHOUT CONTRAST TECHNIQUE: Contiguous axial images were obtained from the base of the skull through the vertex without intravenous contrast. COMPARISON:  Head CT dated 10/13/2016 FINDINGS: Brain: The ventricles and sulci appropriate size for patient's age. A focal area of white matter hypodensity in the right frontal lobe appears similar to prior CT. Old right cerebellar infarct noted. There is no acute intracranial hemorrhage. No mass effect or midline shift. No extra-axial fluid collection. Vascular: No hyperdense vessel or unexpected calcification. Skull: Normal. Negative for fracture or focal lesion. Sinuses/Orbits: There is mild mucoperiosteal thickening of paranasal sinuses and opacification of the nasal passages. Left nasal tube noted. Other: Partially visualized enteric and endotracheal tubes. IMPRESSION: 1. No acute intracranial pathology. 2. Stable focal right frontal periventricular white matter hypodensity and  old right cerebellar infarct. Electronically Signed   By: Elgie Collard M.D.   On: 09/19/2017 02:02   US Renal  Result Date: 09/21/2017 CLINICAL DATA:  Acute renal injury. EXAM: RENAL / URINARY TRACT ULTRASOUND COMPLETE COMPARISON:  No recent prior. FINDINGS: Right Kidney: Length: 13.5 cm. Echogenicity within normal limits. No mass or hydronephrosis visualized. Left Kidney: Length: 11.3 cm. Mild renal size discrepancy may be secondary to angle of measurement of the kidneys. Echogenicity within normal limits. No mass or hydronephrosis visualized. Bladder: Not visualized. IMPRESSION: No acute or focal abnormality identified. Electronically Signed   By: Maisie Fus  Register   On: 09/21/2017 15:14   Dg Chest 1v Repeat Same Day  Result Date: 09/19/2017 CLINICAL DATA:  Respiratory failure, endotracheal tube placement EXAM: CHEST - 1 VIEW SAME DAY COMPARISON:  Portable chest x-ray of 09/19/2016 FINDINGS: The tip of the endotracheal tube appears to be approximately 4.3  cm above the carina. No active infiltrate or effusion is seen. Mediastinal and hilar contours are unchanged and mild cardiomegaly is stable. IMPRESSION: Tip of endotracheal tube approximately 4.3 cm above the carina. No active lung disease. Electronically Signed   By: Dwyane Dee M.D.   On: 09/19/2017 09:52   Dg Chest Port 1 View  Result Date: 09/22/2017 CLINICAL DATA:  Respiratory failure, history of smoking, hypertension, previous episodes of aspiration pneumonia. Intubated patient. EXAM: PORTABLE CHEST 1 VIEW COMPARISON:  Portable chest x-ray of September 21, 2017 FINDINGS: There is increasing density in the right lower lung medially. There is persistent increased density at the left lung base which has improved slightly. The heart is normal in size. The pulmonary vascularity is mildly prominent centrally. The endotracheal tube tip projects approximately 3.8 cm above the carina. The esophagogastric tube tip projects below the inferior margin of the image. IMPRESSION: Worsening right basilar atelectasis or pneumonia. Some improvement in the appearance of the left basilar atelectasis or pneumonia. Mild central pulmonary vascular prominence, stable. Electronically Signed   By: David  [place] M.D.   On: 09/22/2017 09:19   Dg Chest Port 1 View  Result Date: 09/21/2017 CLINICAL DATA:  Respiratory failure EXAM: PORTABLE CHEST 1 VIEW COMPARISON:  Portable exam 0938 hours compared to 09/20/2017 FINDINGS: Tip of endotracheal tube projects 3.4 cm above carina. Nasogastric tube extends to at least the inferior mediastinum. Enlargement of cardiac silhouette with pulmonary vascular congestion. Mild RIGHT basilar atelectasis versus consolidation. Remaining lungs clear. No gross pleural effusion or pneumothorax. IMPRESSION: Mild RIGHT basilar atelectasis versus consolidation. Electronically Signed   By: Ulyses Southward M.D.   On: 09/21/2017 11:45   Portable Chest Xray  Result Date: 09/20/2017 CLINICAL DATA:  Respiratory  failure. EXAM: PORTABLE CHEST 1 VIEW COMPARISON:  09/19/2017 FINDINGS: Endotracheal tube is 4.8 cm above the carina. Nasogastric tube extends into the left upper abdomen. There are new densities at the left lung base. Heart size is within normal limits and stable. Negative for a pneumothorax. IMPRESSION: New left basilar densities. Findings could represent volume loss but cannot exclude pneumonia or aspiration. Endotracheal tube is appropriately positioned. Electronically Signed   By: Richarda Overlie M.D.   On: 09/20/2017 10:21   Dg Chest Portable 1 View  Result Date: 09/19/2017 CLINICAL DATA:  57 year old male with intubation. EXAM: PORTABLE CHEST 1 VIEW COMPARISON:  Chest radiograph dated 03/18/2015 FINDINGS: An endotracheal tube is noted with tip approximately 3.5 cm above the carina. An enteric tube extends into the left hemiabdomen with side-port in the region of the gastroesophageal junction and tip  in the proximal stomach. Recommend further advancing of the tube into the stomach by an additional at least 5 cm. There is mild eventration of the right hemidiaphragm. There is no focal consolidation, pleural effusion, or pneumothorax. Mild cardiomegaly. No acute osseous pathology. IMPRESSION: 1. Endotracheal tube above the carina. Enteric tube with side port at the GE junction. Recommend further advancing of the tube into the stomach. 2. No acute cardiopulmonary process. 3. Mild cardiomegaly. Electronically Signed   By: Elgie Collard M.D.   On: 09/19/2017 00:56   Dg Chest Port 1v Same Day  Result Date: 09/21/2017 CLINICAL DATA:  Orogastric tube placement. EXAM: PORTABLE CHEST 1 VIEW COMPARISON:  09/21/2017. FINDINGS: Endotracheal tube in satisfactory position. Orogastric tube visualized at the thoracic inlet and obscured distally by the density of the overlying mediastinum. This appears to be faintly visualized in the left upper abdomen with its tip in the lateral aspect of the proximal stomach. Interval  patchy opacity in the left lower lobe and medial right lung base. Previously demonstrated ill-defined opacity at the remainder of the right lung base is no longer seen. Stable mildly enlarged cardiac silhouette. Thoracic spine degenerative changes. IMPRESSION: 1. The orogastric tube is not adequately visualized with its tip most likely in the proximal stomach laterally. 2. Interval patchy pneumonia scratch the interval pneumonia or patchy atelectasis in the left lower lobe. 3. Interval pneumonia or patchy atelectasis in the medial right lung base. 4. Stable mild cardiomegaly. Electronically Signed   By: Beckie Salts M.D.   On: 09/21/2017 14:42    Microbiology: Recent Results (from the past 240 hour(s))  Urine Culture     Status: None   Collection Time: 09/19/17  1:20 AM  Result Value Ref Range Status   Specimen Description   Final    URINE, CATHETERIZED Performed at [facility], [address], [place], [place] [address]    Special Requests   Final    NONE Performed at [facility], [address] [place], [place] [address]    Culture   Final    NO GROWTH Performed at [facility], [address], [place], [place] [address]    Report Status 09/20/2017 FINAL  Final  Blood culture (routine x 2)     Status: None   Collection Time: 09/19/17  2:04 AM  Result Value Ref Range Status   Specimen Description BLOOD RIGHT HAND  Final   Special Requests   Final    BOTTLES DRAWN AEROBIC AND ANAEROBIC Blood Culture adequate volume   Culture   Final    NO GROWTH 5 DAYS Performed at [facility], [address], [place], [place] [address]    Report Status 09/24/2017 FINAL  Final  Blood culture (routine x 2)     Status: None   Collection Time: 09/19/17  2:06 AM  Result Value Ref Range Status   Specimen Description BLOOD LEFT HAND  Final   Special Requests   Final    BOTTLES DRAWN AEROBIC ONLY Blood Culture adequate volume   Culture   Final    NO GROWTH 5 DAYS Performed at [facility], [address] [place], [place] [address]    Report Status 09/24/2017 FINAL  Final  MRSA PCR Screening     Status: None   Collection Time: 09/19/17  3:50 AM  Result Value Ref Range Status   MRSA by PCR NEGATIVE NEGATIVE Final    Comment:        The GeneXpert MRSA Assay (  FDA approved for NASAL specimens only), is one component of a comprehensive MRSA colonization surveillance program. It is not intended to diagnose MRSA infection nor to guide or monitor treatment for MRSA infections. Performed at [facility], [address] [place], [place] [address]      Labs: Basic Metabolic Panel: Recent Labs  Lab 09/19/17 0252  09/21/17 0424 09/21/17 0425 09/21/17 1732 09/22/17 0405 09/22/17 1705 09/23/17 0409 09/24/17 0447 09/25/17 0403  NA  --    < >  --  142  --  143  --  146* 145 140  K  --    < >  --  3.9  --  4.1  --  4.5 3.9 2.9*  CL  --    < >  --  111  --  109  --  107 103 99  CO2  --    < >  --  22  --  27  --  27 31 33*  GLUCOSE  --    < >  --  80  --  102*  --  109* 102* 101*  BUN  --    < >  --  13  --  13  --  19 23* 23*  CREATININE  --    < >  --  1.56*  --  1.13  --  1.00 0.86 1.02  CALCIUM  --    < >  --  8.0*  --  8.3*  --  8.6* 8.9 8.8*  MG 2.5*  --  2.5*  --  2.3 2.4 2.5*  --   --  2.2  PHOS 2.6  --  4.7*  --  4.4 4.1 3.5  --   --   --    < > = values in this interval not displayed.   Liver Function Tests: Recent Labs  Lab 09/19/17 0100 09/20/17 0938  AST 47* 43*  ALT 38 30  ALKPHOS 77 54  BILITOT 0.7 0.6  PROT 8.8* 5.9*  ALBUMIN 4.5 3.1*   CBC: Recent Labs  Lab 09/19/17 0100  09/19/17 1448 09/19/17 1900 09/20/17 0938 09/21/17 0620 09/25/17 0403  WBC 14.4*  --   --  12.1* 10.1 11.2* 16.0*  NEUTROABS 7.3  --   --   --  5.8 THIS TEST WAS ORDERED IN ERROR AND HAS BEEN CREDITED.  --   HGB 15.4   < > 12.5* 12.2* 11.8* 11.1* 12.9*  HCT 48.3   < > 38.6* 37.8* 37.6* 36.4* 40.1  MCV 83.6  --   --  80.8 81.7 82.7 81.8  PLT 400  --   --  300 265  238 216   < > = values in this interval not displayed.   Cardiac Enzymes: Recent Labs  Lab 09/19/17 0100  TROPONINI <0.03   CBG: Recent Labs  Lab 09/23/17 2338 09/24/17 0737 09/24/17 1137 09/24/17 2015 09/25/17 0738  GLUCAP 100* 109* 115* 91 100*    Signed:  Vassie Loll MD.  [facility] 09/25/2017, 10:26 AM

## 2017-09-25 NOTE — Care Management Note (Signed)
Case Management Note  Patient Details  Name: Nikolis Governale MRN: [id number] Date of Birth: 1960-10-04  Subjective/Objective:     status epilepticus, acute respiratory failure. From home with wife and son. Independent. Recommended for HH PT and RW. Discussed home health with patient and wife. Patient declines need for [facility] PT. Wife agreeable, feels that with her being home 24/7 and son being available that patient will be ok. Agreeable to RW. Offered choice.                Action/Plan: Bonita Quin of [facility] notified and will deliver to room prior to DC.   Expected Discharge Date:  09/25/17               Expected Discharge Plan:  Home/Self Care  In-House Referral:     Discharge planning Services  CM Consult  Post Acute Care Choice:  Home Health, Durable Medical Equipment Choice offered to:  Patient, Spouse  DME Arranged:  Walker rolling DME Agency:  [facility]  HH Arranged:  Patient Refused [facility] HH Agency:     Status of Service:  Completed, signed off  If discussed at Long Length of Stay Meetings, dates discussed:    Additional Comments:  Malacki Mcphearson, Chrystine Oiler, RN 09/25/2017, 10:58 AM

## 2017-09-25 NOTE — Evaluation (Signed)
Physical Therapy Evaluation Patient Details Name: Reginald [name] MRN: [id number] DOB: January 13, 1961 Today's Date: 09/25/2017   History of Present Illness  Reginald [name] is a 57 y.o. male with medical history significant of hypertension, seizure disorder alcohol abuse who was brought to the emergency department via EMS with status epilepticus.  He was intubated for airway protection.  Apparently, the patient uses cannabis for his seizures, but his wife stated to the ED nursing staff that he has not used that in the past couple days and may be the reason why he is having seizures.  After the NG tube was inserted, he draining a substantial amount of blood, which raises the possibility of UGI.  I doubt that the patient bleed down much from tongue bite.  There is no further history is available.    Clinical Impression  Patient functioning near baseline for functional mobility and gait, slightly unsteady on feet without use of an AD with frequent leaning on nearby objects for support requiring use of RW for safety and demonstrates good return for use or RW with understanding acknowledged.  Patient tolerated sitting up in chair with spouse present after therapy.  Plan: patient to be discharged to home today and discharged from physical therapy to care of nursing for ambulation as tolerated for length of stay.    Follow Up Recommendations Home health PT;Supervision for mobility/OOB    Equipment Recommendations  Rolling walker with 5" wheels    Recommendations for Other Services       Precautions / Restrictions Precautions Precautions: Fall Restrictions Weight Bearing Restrictions: No      Mobility  Bed Mobility Overal bed mobility: Independent                Transfers Overall transfer level: Modified independent Equipment used: None             General transfer comment: slow labored movement  Ambulation/Gait Ambulation/Gait assistance: Supervision Gait Distance (Feet): 100  Feet Assistive device: Rolling walker (2 wheeled) Gait Pattern/deviations: Decreased step length - right;Decreased step length - left;Decreased stride length Gait velocity: decreased   General Gait Details: unsteady on feet with frequent leaning on nearby objects without using an AD, increased safety using RW, able to ambulate around nursing station without loss of balance  Stairs            Wheelchair Mobility    Modified Rankin (Stroke Patients Only)       Balance Overall balance assessment: Needs assistance Sitting-balance support: Feet supported;No upper extremity supported Sitting balance-Leahy Scale: Good     Standing balance support: During functional activity;No upper extremity supported Standing balance-Leahy Scale: Poor Standing balance comment: fair/poor without AD, fair using RW                             Pertinent Vitals/Pain Pain Assessment: No/denies pain    Home Living Family/patient expects to be discharged to:: Private residence Living Arrangements: Spouse/significant other Available Help at Discharge: Family Type of Home: House Home Access: Ramped entrance     Home Layout: One level Home Equipment: None      Prior Function Level of Independence: Independent         Comments: household and short distance community ambulator      Journalist, newspaper        Extremity/Trunk Assessment   Upper Extremity Assessment Upper Extremity Assessment: Overall WFL for tasks assessed    Lower Extremity Assessment Lower Extremity  Assessment: Generalized weakness    Cervical / Trunk Assessment Cervical / Trunk Assessment: Normal  Communication   Communication: No difficulties  Cognition Arousal/Alertness: Awake/alert Behavior During Therapy: WFL for tasks assessed/performed Overall Cognitive Status: Within Functional Limits for tasks assessed                                        General Comments       Exercises     Assessment/Plan    PT Assessment All further PT needs can be met in the next venue of care  PT Problem List Decreased strength;Decreased activity tolerance;Decreased balance;Decreased mobility       PT Treatment Interventions      PT Goals (Current goals can be found in the Care Plan section)  Acute Rehab PT Goals Patient Stated Goal: return home with family to assist PT Goal Formulation: With patient/family Time For Goal Achievement: 09/25/17 Potential to Achieve Goals: Good    Frequency     Barriers to discharge        Co-evaluation               AM-PAC PT "6 Clicks" Daily Activity  Outcome Measure Difficulty turning over in bed (including adjusting bedclothes, sheets and blankets)?: None Difficulty moving from lying on back to sitting on the side of the bed? : None Difficulty sitting down on and standing up from a chair with arms (e.g., wheelchair, bedside commode, etc,.)?: None Help needed moving to and from a bed to chair (including a wheelchair)?: A Little Help needed walking in hospital room?: A Little Help needed climbing 3-5 steps with a railing? : A Little 6 Click Score: 21    End of Session   Activity Tolerance: Patient tolerated treatment well;Patient limited by fatigue Patient left: in chair;with call bell/phone within reach;with family/visitor present Nurse Communication: Mobility status PT Visit Diagnosis: Unsteadiness on feet (R26.81);Other abnormalities of gait and mobility (R26.89);Muscle weakness (generalized) (M62.81)    Time: 1324-4010 PT Time Calculation (min) (ACUTE ONLY): 22 min   Charges:   PT Evaluation $PT Eval Moderate Complexity: 1 Mod PT Treatments $Therapeutic Activity: 23-37 mins        12:32 PM, 09/25/17 Lonell Grandchild, MPT Physical Therapist with [facility] 336 [phone] office [phone] mobile phone

## 2017-09-25 NOTE — Progress Notes (Signed)
Discharge instructions provided to both pt and wife. Informed pt that I would give him another dose of potassium and flagyl before discharge. Informed pt that after this dose of flagyl his next dose would be tonight around 9 p.m and he would have to continue taking for next 4 days. Both pt and wife demonstrated acceptance of discharge instructions.

## 2017-09-25 NOTE — Progress Notes (Signed)
Nutrition Follow-up  DOCUMENTATION CODES:   Obesity unspecified  INTERVENTION:   - Continue MVI with minerals daily  - Ensure Enlive po BID, each supplement provides 350 kcal and 20 grams of protein (chocolate flavor)  - Encourage adequate PO inake  NUTRITION DIAGNOSIS:   Inadequate oral intake related to inability to eat as evidenced by NPO status.  Progressing, diet advanced to Heart Healthy on 9/8 with 90% meal completion recorded  GOAL:   Patient will meet greater than or equal to 90% of their needs  Progressing  MONITOR:   PO intake, Supplement acceptance, Weight trends, I & O's, Labs  ASSESSMENT:   57 year old male with PMH significant for HTN, seizure disorder, and EtOH abuse. Presented via EMS w/ status epilepticus. Intubated in the ED for airway protection. Had acute UGI after OGT inserted. GI/Pulm/neuro consulted.  9/3 - intubation 9/4 - concern for aspiration, started on antibiotics 9/5 - tube feedings initiated: Vital HP @ 15 ml/hr, 60 ml Pro-stat QID, episode of vomiting 9/7 - extubation, OGT removed 9/8 - diet advanced  Pt's current weight is now similar to his weight on admission. Suspect weight fluctuations related to fluid status.  Spoke with pt and wife at bedside. Pt states that things are going well for him and that he is looking forward to going home, possibly today.  Pt reports that he is drinking more than eating at this time as he is "a little scared" to eat after not having food for 5-6 days. RD explained tube feedings and encouraged pt to eat as he is able. Provided education regarding enteral nutrition and how tube feedings stimulate the GI tract. Pt agreeable to RD ordering Ensure Enlive oral nutrition supplement during admission to aid in achieving adequate kcal and protein intake.  Pt states that he ate grits at breakfast this morning. Pt believes he will eat more once he is able to go home.  Meal Completion: 90%  Medications reviewed  and include: 600 mg Depakote BID, 1 mg folic acid daily, Keppra, PO antibiotics, MVI with minerals daily, 40 mg Protonix BID, 40 mEq K-dur q 4 hours today, 100 mg thiamine daily  Labs reviewed: potassium 2.9 (L) - being replaced, CO2 33 (H), BUN 23 (H), triglycerides 163 (H), hemoglobin 12.9 (L) CBG's: 100, 91, 115 x 24 hours  UOP: 1075 ml x 24 hours I/O's: +9.3 L since admit  Diet Order:   Diet Order            Diet - low sodium heart healthy        Diet Heart Room service appropriate? Yes; Fluid consistency: Thin  Diet effective now              EDUCATION NEEDS:   Education needs have been addressed  Skin:  Skin Assessment: Reviewed RN Assessment  Last BM:  09/25/17  Height:   Ht Readings from Last 1 Encounters:  09/23/17 6\' 2"  (1.88 m)    Weight:   Wt Readings from Last 1 Encounters:  09/25/17 116.6 kg    Ideal Body Weight:  86.36 kg  BMI:  Body mass index is 33 kg/m.  Estimated Nutritional Needs:   Kcal:  2200-2400  Protein:  120-135 grams  Fluid:  >/= 2.0 L    Earma Reading, MS, RD, LDN Pager: [phone] Weekend/After Hours: [phone]

## 2017-11-24 ENCOUNTER — Other Ambulatory Visit: Payer: Self-pay

## 2017-11-24 ENCOUNTER — Emergency Department (HOSPITAL_COMMUNITY)
Admission: EM | Admit: 2017-11-24 | Discharge: 2017-11-24 | Disposition: A | Discharge status: Home / Self Care | Payer: Non-veteran care | Attending: Emergency Medicine | Admitting: Emergency Medicine

## 2017-11-24 ENCOUNTER — Encounter (HOSPITAL_COMMUNITY): Payer: Self-pay

## 2017-11-24 ENCOUNTER — Emergency Department (HOSPITAL_COMMUNITY): Payer: Non-veteran care

## 2017-11-24 DIAGNOSIS — R2242 Localized swelling, mass and lump, left lower limb: Secondary | ICD-10-CM | POA: Diagnosis not present

## 2017-11-24 DIAGNOSIS — Z7982 Long term (current) use of aspirin: Secondary | ICD-10-CM | POA: Diagnosis not present

## 2017-11-24 DIAGNOSIS — R569 Unspecified convulsions: Secondary | ICD-10-CM | POA: Diagnosis not present

## 2017-11-24 DIAGNOSIS — I10 Essential (primary) hypertension: Secondary | ICD-10-CM | POA: Diagnosis not present

## 2017-11-24 DIAGNOSIS — Z79899 Other long term (current) drug therapy: Secondary | ICD-10-CM | POA: Insufficient documentation

## 2017-11-24 DIAGNOSIS — F1010 Alcohol abuse, uncomplicated: Secondary | ICD-10-CM | POA: Diagnosis not present

## 2017-11-24 DIAGNOSIS — F17200 Nicotine dependence, unspecified, uncomplicated: Secondary | ICD-10-CM | POA: Insufficient documentation

## 2017-11-24 LAB — CBC WITH DIFFERENTIAL/PLATELET
Abs Immature Granulocytes: 0.04 10*3/uL (ref 0.00–0.07)
BASOS PCT: 1 %
Basophils Absolute: 0.1 10*3/uL (ref 0.0–0.1)
EOS ABS: 0.2 10*3/uL (ref 0.0–0.5)
EOS PCT: 2 %
HCT: 43.4 % (ref 39.0–52.0)
Hemoglobin: 13.2 g/dL (ref 13.0–17.0)
Immature Granulocytes: 1 %
Lymphocytes Relative: 19 %
Lymphs Abs: 1.7 10*3/uL (ref 0.7–4.0)
MCH: 24.7 pg — AB (ref 26.0–34.0)
MCHC: 30.4 g/dL (ref 30.0–36.0)
MCV: 81.3 fL (ref 80.0–100.0)
MONO ABS: 0.7 10*3/uL (ref 0.1–1.0)
MONOS PCT: 8 %
Neutro Abs: 6.2 10*3/uL (ref 1.7–7.7)
Neutrophils Relative %: 69 %
PLATELETS: 378 10*3/uL (ref 150–400)
RBC: 5.34 MIL/uL (ref 4.22–5.81)
RDW: 17.4 % — ABNORMAL HIGH (ref 11.5–15.5)
WBC: 8.8 10*3/uL (ref 4.0–10.5)
nRBC: 0 % (ref 0.0–0.2)

## 2017-11-24 LAB — BASIC METABOLIC PANEL
Anion gap: 8 (ref 5–15)
BUN: 11 mg/dL (ref 6–20)
CALCIUM: 8.9 mg/dL (ref 8.9–10.3)
CO2: 26 mmol/L (ref 22–32)
CREATININE: 1.03 mg/dL (ref 0.61–1.24)
Chloride: 101 mmol/L (ref 98–111)
GFR calc Af Amer: >60 mL/min (ref >60)
GLUCOSE: 110 mg/dL — AB (ref 70–99)
POTASSIUM: 3.4 mmol/L — AB (ref 3.5–5.1)
SODIUM: 135 mmol/L (ref 135–145)

## 2017-11-24 LAB — URINALYSIS, ROUTINE W REFLEX MICROSCOPIC
Bacteria, UA: NONE SEEN
Bilirubin Urine: NEGATIVE
GLUCOSE, UA: NEGATIVE mg/dL
Ketones, ur: NEGATIVE mg/dL
LEUKOCYTES UA: NEGATIVE
Nitrite: NEGATIVE
PROTEIN: NEGATIVE mg/dL
Specific Gravity, Urine: 1.01 (ref 1.005–1.030)
pH: 7 (ref 5.0–8.0)

## 2017-11-24 LAB — VALPROIC ACID LEVEL: VALPROIC ACID LVL: 24 ug/mL — AB (ref 50.0–100.0)

## 2017-11-24 NOTE — ED Triage Notes (Signed)
Pt has history of epilepsy, had several witnessed seizures at home, was given 2.5 mg of versed en route per ems.  Pt is awake and alert at this time.

## 2017-11-24 NOTE — ED Notes (Signed)
Called Dr. Wilford Corner, Neurology called for Dr.

## 2017-11-24 NOTE — Discharge Instructions (Addendum)
You were evaluated in the emergency department for a seizure.  You had no further seizure episodes while you were here.  Your Depakote was slightly low at 24.  Please call Dr. Gerilyn Pilgrim for recommendations on medication adjustments.  Return if any concerns.

## 2017-11-24 NOTE — ED Notes (Signed)
Pt ambulatory to waiting room. Pt verbalized understanding of discharge instructions.   

## 2017-11-24 NOTE — ED Notes (Signed)
Pt given urinal, aware of UA order

## 2017-11-24 NOTE — ED Provider Notes (Signed)
[facility] EMERGENCY DEPARTMENT Provider Note   CSN: [id number] Arrival date & time: 11/24/17  4098     History   Chief Complaint Chief Complaint  Patient presents with  . Seizures    HPI Reginald [name] is a 57 y.o. male.  History of seizures and was noted by his wife this morning while in bed to be having some shaking on the right side of his body and making some grunting sound.  She said this lasted 10 or 15 minutes.  After that stop she gave him his morning seizure medicine.  It sounds like he usually has back to back seizures and was recently admitted a few months ago for status and so she called 911 afraid that he was going to have continued seizures.  Patient received some Versed in route.  Currently he is awake and alert complaining of feeling cold.  He denies any recent illness.  York Spaniel he has been taking his medications regularly.  No chest pain cough shortness of breath abdominal pain nausea vomiting diarrhea or urinary symptoms.  The history is provided by the patient and the spouse.  Seizures   This is a recurrent problem. The current episode started less than 1 hour ago. The problem has been resolved. There was 1 seizure. The most recent episode lasted more than 5 minutes. Pertinent negatives include no speech difficulty, no visual disturbance, no sore throat, no chest pain, no cough, no nausea, no vomiting and no diarrhea. Characteristics include rhythmic jerking and loss of consciousness. The episode was witnessed. The seizures did not continue in the ED. The seizure(s) had right-sided focality. Possible causes do not include missed seizure meds or recent illness. There has been no fever. Meds prior to arrival: midazolam.    Past Medical History:  Diagnosis Date  . GERD (gastroesophageal reflux disease)   . Hypertension   . Seizures (HCC)   . Wears glasses     Patient Active Problem List   Diagnosis Date Noted  . AKI (acute kidney injury) (HCC)   . Gastritis   .  Acute respiratory failure with hypoxia (HCC) 09/22/2017  . Status epilepticus (HCC) 09/19/2017  . Lactic acidosis 09/19/2017  . UGI bleed 09/19/2017  . Alcohol abuse 05/24/2015  . Seizure (HCC) 05/22/2015  . Aspiration pneumonia (HCC) 03/17/2015  . 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 History of hypertension 03/17/2015  . Convulsions/seizures (HCC) 03/17/2015  . 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 Hypokalemia     Past Surgical History:  Procedure Laterality Date  . COLONOSCOPY     remote past in [place]. ?Polyp. Wife unsure who performed or when  . SHOULDER ARTHROSCOPY WITH SUBACROMIAL DECOMPRESSION Right 05/06/2013   Procedure: Right shoulder arthroscopy rotator cuff tear repair, subacromial decompression;  Surgeon: Mable Paris, MD;  Location: [facility];  Service: Orthopedics;  Laterality: Right;  Right shoulder arthroscopy rotator cuff tear repair, subacromial decompression  . TONSILLECTOMY    . UMBILICAL HERNIA REPAIR  2007  . WISDOM TOOTH EXTRACTION          Home Medications    Prior to Admission medications   Medication Sig Start Date End Date Taking? Authorizing Provider  amLODipine (NORVASC) 10 MG tablet Take 10 mg by mouth daily. 03/11/17   [provider]  aspirin 81 MG chewable tablet Chew 1 tablet (81 mg total) by mouth daily. 03/20/15   Rhetta Mura, MD  divalproex (DEPAKOTE) 500 MG DR tablet Take  1 tablet (500 mg total) by mouth every 12 (twelve) hours. 09/25/17   Vassie Loll, MD  hydrochlorothiazide (HYDRODIURIL) 25 MG tablet Take 1 tablet by mouth every morning. 03/02/17   [provider]  levETIRAcetam (KEPPRA) 750 MG tablet Take 2 tablets (1,500 mg total) by mouth 2 (two) times daily. 09/25/17   Vassie Loll, MD  LORazepam (ATIVAN) 1 MG tablet Take 1 every 6 hours if needed for minor seizures or shaking or anxiety 03/28/17   Bethann Berkshire, MD  magnesium oxide (MAG-OX) 400 MG  tablet Take 1 tablet (400 mg total) by mouth daily. 09/25/17   Vassie Loll, MD  Multiple Vitamin (MULTIVITAMIN WITH MINERALS) TABS tablet Take 1 tablet by mouth daily. 09/26/17   Vassie Loll, MD  olmesartan (BENICAR) 20 MG tablet Take 20 mg by mouth daily. 03/01/17   [provider]  Omega-3 Fatty Acids (FISH OIL) 1000 MG CAPS Take 1 capsule by mouth daily.     [provider]  pantoprazole (PROTONIX) 40 MG tablet Take 1 tablet (40 mg total) by mouth daily. 09/25/17   Vassie Loll, MD  potassium chloride SA (K-DUR,KLOR-CON) 20 MEQ tablet Take 1 tablet (20 mEq total) by mouth daily. 09/25/17   Vassie Loll, MD  tadalafil (CIALIS) 5 MG tablet Take 5 mg by mouth daily as needed for erectile dysfunction.    [provider]    Family History Family History  Problem Relation Age of Onset  . Hypertension Mother   . Breast cancer Sister   . Colon cancer Neg Hx   . Liver disease Neg Hx     Social History Social History   Tobacco Use  . Smoking status: Current Every Day Smoker    Packs/day: 0.25  . Smokeless tobacco: Current User  Substance Use Topics  . 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 Drug use: Yes    Types: Marijuana    Comment: former     Allergies   Penicillins   Review of Systems Review of Systems  Constitutional: Negative for fever.  HENT: Negative for sore throat.   Eyes: Negative for visual disturbance.  Respiratory: Negative for cough and shortness of breath.   Cardiovascular: Positive for leg swelling (left). Negative for chest pain.  Gastrointestinal: Negative for abdominal pain, diarrhea, nausea and vomiting.  Genitourinary: Negative for dysuria.  Musculoskeletal: Negative for neck pain.  Skin: Negative for rash.  Neurological: Positive for seizures and loss of consciousness. Negative for speech difficulty.     Physical Exam Updated Vital Signs BP (!) 119/95 (BP Location: Left Arm)    Pulse 82   Temp 98 F (36.7 C) (Oral)   Resp 18   Ht 6\' 5"  (1.956 m)   Wt 113.4 kg   SpO2 94%   BMI 29.65 kg/m   Physical Exam  Constitutional: He appears well-developed and well-nourished.  HENT:  Head: Normocephalic and atraumatic.  Eyes: Conjunctivae are normal.  Neck: Neck supple.  Cardiovascular: Normal rate and regular rhythm.  No murmur heard. Pulmonary/Chest: Effort normal and breath sounds normal. No respiratory distress.  Abdominal: Soft. There is no tenderness.  Musculoskeletal: He exhibits edema (1+ left leg). He exhibits no tenderness or deformity.  Neurological: He is alert. He has normal strength. No sensory deficit. GCS eye subscore is 4. GCS verbal subscore is 5. GCS motor subscore is 6.  Skin: Skin is warm and dry.  Psychiatric: He has a normal mood  and affect.  Nursing note and vitals reviewed.    ED Treatments / Results  Labs (all labs ordered are listed, but only abnormal results are displayed) Labs Reviewed  BASIC METABOLIC PANEL - Abnormal; Notable for the following components:      Result Value   Potassium 3.4 (*)    Glucose, Bld 110 (*)    All other components within normal limits  CBC WITH DIFFERENTIAL/PLATELET - Abnormal; Notable for the following components:   MCH 24.7 (*)    RDW 17.4 (*)    All other components within normal limits  VALPROIC ACID LEVEL - Abnormal; Notable for the following components:   Valproic Acid Lvl 24 (*)    All other components within normal limits  URINALYSIS, ROUTINE W REFLEX MICROSCOPIC - Abnormal; Notable for the following components:   Color, Urine STRAW (*)    Hgb urine dipstick SMALL (*)    All other components within normal limits    EKG EKG Interpretation  Date/Time:  Friday November 24 2017 07:11:18 EST Ventricular Rate:  81 PR Interval:    QRS Duration: 100 QT Interval:  356 QTC Calculation: 414 R Axis:   61 Text Interpretation:  Sinus rhythm Borderline low voltage, extremity leads Abnormal  R-wave progression, early transition similar to prior 2/17 Confirmed by Meridee Score [phone]) on 11/24/2017 7:40:32 AM   Radiology Dg Chest 2 View  Result Date: 11/24/2017 CLINICAL DATA:  Seizure today. EXAM: CHEST - 2 VIEW COMPARISON:  Single-view chest 09/22/2017, 09/21/2017 and 09/19/2017. PA and lateral chest 03/18/2015. FINDINGS: Lung volumes are somewhat low with crowding of the bronchovascular structures. No edema, consolidative process, pneumothorax or effusion. Heart size is normal. No acute or focal bony abnormality. IMPRESSION: No acute disease. Electronically Signed   By: Drusilla Kanner M.D.   On: 11/24/2017 08:35   US Venous Img Lower  Left (dvt Study)  Result Date: 11/24/2017 CLINICAL DATA:  Left lower extremity edema. History of smoking. Evaluate for DVT. EXAM: LEFT LOWER EXTREMITY VENOUS DOPPLER ULTRASOUND TECHNIQUE: Gray-scale sonography with graded compression, as well as color Doppler and duplex ultrasound were performed to evaluate the lower extremity deep venous systems from the level of the common femoral vein and including the common femoral, femoral, profunda femoral, popliteal and calf veins including the posterior tibial, peroneal and gastrocnemius veins when visible. The superficial great saphenous vein was also interrogated. Spectral Doppler was utilized to evaluate flow at rest and with distal augmentation maneuvers in the common femoral, femoral and popliteal veins. COMPARISON:  None. FINDINGS: Contralateral Common Femoral Vein: Respiratory phasicity is normal and symmetric with the symptomatic side. No evidence of thrombus. Normal compressibility. Common Femoral Vein: No evidence of thrombus. Normal compressibility, respiratory phasicity and response to augmentation. Saphenofemoral Junction: No evidence of thrombus. Normal compressibility and flow on color Doppler imaging. Profunda Femoral Vein: No evidence of thrombus. Normal compressibility and flow on color Doppler  imaging. Femoral Vein: No evidence of thrombus. Normal compressibility, respiratory phasicity and response to augmentation. Popliteal Vein: No evidence of thrombus. Normal compressibility, respiratory phasicity and response to augmentation. Calf Veins: No evidence of thrombus. Normal compressibility and flow on color Doppler imaging. Superficial Great Saphenous Vein: No evidence of thrombus. Normal compressibility. Venous Reflux:  None. Other Findings:  None. IMPRESSION: No evidence of DVT within the left lower extremity. Electronically Signed   By: Simonne Come M.D.   On: 11/24/2017 08:44    Procedures Procedures (including critical care time)  Medications Ordered in ED Medications - No  data to display   Initial Impression / Assessment and Plan / ED Course  I have reviewed the triage vital signs and the nursing notes.  Pertinent labs & imaging results that were available during my care of the patient were reviewed by me and considered in my medical decision making (see chart for details).  Clinical Course as of Nov 25 1727  Caleen Essex Nov 24, 2017  1610 Initial work-up is been fairly unremarkable.  I attempted to reach the primary neurologist for the patient Dr. Gerilyn Pilgrim that he was unavailable.  The wife is comfortable taking him home and will give Dr. Gerilyn Pilgrim call regarding any medication changes.   [MB]    Clinical Course User Index [MB] Terrilee Files, MD     Final Clinical Impressions(s) / ED Diagnoses   Final diagnoses:  Seizure [facility])    ED Discharge Orders    None       Terrilee Files, MD 11/24/17 1729

## 2017-12-13 ENCOUNTER — Ambulatory Visit: Payer: No Typology Code available for payment source | Admitting: Gastroenterology

## 2018-03-05 ENCOUNTER — Other Ambulatory Visit: Payer: Self-pay

## 2018-03-05 ENCOUNTER — Encounter (HOSPITAL_COMMUNITY): Payer: Self-pay | Admitting: Emergency Medicine

## 2018-03-05 ENCOUNTER — Emergency Department (HOSPITAL_COMMUNITY)
Admission: EM | Admit: 2018-03-05 | Discharge: 2018-03-05 | Disposition: A | Discharge status: Home / Self Care | Payer: No Typology Code available for payment source | Attending: Emergency Medicine | Admitting: Emergency Medicine

## 2018-03-05 ENCOUNTER — Emergency Department (HOSPITAL_COMMUNITY): Payer: No Typology Code available for payment source

## 2018-03-05 DIAGNOSIS — Z79899 Other long term (current) drug therapy: Secondary | ICD-10-CM | POA: Insufficient documentation

## 2018-03-05 DIAGNOSIS — Z7982 Long term (current) use of aspirin: Secondary | ICD-10-CM | POA: Diagnosis not present

## 2018-03-05 DIAGNOSIS — F17210 Nicotine dependence, cigarettes, uncomplicated: Secondary | ICD-10-CM | POA: Diagnosis not present

## 2018-03-05 DIAGNOSIS — I10 Essential (primary) hypertension: Secondary | ICD-10-CM | POA: Insufficient documentation

## 2018-03-05 DIAGNOSIS — R569 Unspecified convulsions: Secondary | ICD-10-CM | POA: Diagnosis present

## 2018-03-05 LAB — COMPREHENSIVE METABOLIC PANEL
ALT: 16 U/L (ref 0–44)
AST: 23 U/L (ref 15–41)
Albumin: 3.5 g/dL (ref 3.5–5.0)
Alkaline Phosphatase: 43 U/L (ref 38–126)
Anion gap: 7 (ref 5–15)
BUN: 10 mg/dL (ref 6–20)
CO2: 26 mmol/L (ref 22–32)
Calcium: 9 mg/dL (ref 8.9–10.3)
Chloride: 106 mmol/L (ref 98–111)
Creatinine, Ser: 1.05 mg/dL (ref 0.61–1.24)
GFR calc Af Amer: >60 mL/min (ref >60)
GFR calc non Af Amer: >60 mL/min (ref >60)
GLUCOSE: 93 mg/dL (ref 70–99)
Potassium: 3.8 mmol/L (ref 3.5–5.1)
SODIUM: 139 mmol/L (ref 135–145)
Total Bilirubin: 0.4 mg/dL (ref 0.3–1.2)
Total Protein: 6.7 g/dL (ref 6.5–8.1)

## 2018-03-05 LAB — CBC WITH DIFFERENTIAL/PLATELET
ABS IMMATURE GRANULOCYTES: 0.08 10*3/uL — AB (ref 0.00–0.07)
Basophils Absolute: 0 10*3/uL (ref 0.0–0.1)
Basophils Relative: 0 %
Eosinophils Absolute: 0.1 10*3/uL (ref 0.0–0.5)
Eosinophils Relative: 1 %
HCT: 43.3 % (ref 39.0–52.0)
Hemoglobin: 13.7 g/dL (ref 13.0–17.0)
Immature Granulocytes: 1 %
Lymphocytes Relative: 17 %
Lymphs Abs: 2 10*3/uL (ref 0.7–4.0)
MCH: 25.8 pg — ABNORMAL LOW (ref 26.0–34.0)
MCHC: 31.6 g/dL (ref 30.0–36.0)
MCV: 81.7 fL (ref 80.0–100.0)
Monocytes Absolute: 1.1 10*3/uL — ABNORMAL HIGH (ref 0.1–1.0)
Monocytes Relative: 10 %
NEUTROS ABS: 8.1 10*3/uL — AB (ref 1.7–7.7)
Neutrophils Relative %: 71 %
Platelets: 403 10*3/uL — ABNORMAL HIGH (ref 150–400)
RBC: 5.3 MIL/uL (ref 4.22–5.81)
RDW: 16.8 % — ABNORMAL HIGH (ref 11.5–15.5)
WBC: 11.5 10*3/uL — ABNORMAL HIGH (ref 4.0–10.5)
nRBC: 0 % (ref 0.0–0.2)

## 2018-03-05 LAB — URINALYSIS, ROUTINE W REFLEX MICROSCOPIC
BILIRUBIN URINE: NEGATIVE
Bacteria, UA: NONE SEEN
Glucose, UA: NEGATIVE mg/dL
Ketones, ur: NEGATIVE mg/dL
Leukocytes,Ua: NEGATIVE
Nitrite: NEGATIVE
Protein, ur: NEGATIVE mg/dL
Specific Gravity, Urine: 1.014 (ref 1.005–1.030)
pH: 7 (ref 5.0–8.0)

## 2018-03-05 MED ORDER — SODIUM CHLORIDE 0.9 % IV BOLUS
500.0000 mL | Freq: Once | INTRAVENOUS | Status: AC
  Administered 2018-03-05: 500 mL via INTRAVENOUS

## 2018-03-05 MED ORDER — LEVETIRACETAM IN NACL 1000 MG/100ML IV SOLN
1000.0000 mg | Freq: Once | INTRAVENOUS | Status: AC
  Administered 2018-03-05: 1000 mg via INTRAVENOUS
  Filled 2018-03-05: qty 100

## 2018-03-05 MED ORDER — LORAZEPAM 2 MG/ML IJ SOLN
1.0000 mg | Freq: Once | INTRAMUSCULAR | Status: AC
  Administered 2018-03-05: 1 mg via INTRAVENOUS
  Filled 2018-03-05: qty 1

## 2018-03-05 NOTE — ED Notes (Signed)
Per wife, everytime pt breaks out in seizure he breaks out in a red rash.

## 2018-03-05 NOTE — ED Triage Notes (Addendum)
EMS called to pt's house for seizures but pt's wife states he gets anxious and has shaking spells where he is able to talk during them. EMS states pt was alert and oriented x 4 when they arrived. Pt has heavy odor of Marijuana on him.

## 2018-03-05 NOTE — Discharge Instructions (Addendum)
Tests were good.  CT scan did not show any new abnormalities.  Continue with your seizure medication.  Recommend follow-up with a neurologist.

## 2018-03-06 NOTE — ED Provider Notes (Signed)
[facility] EMERGENCY DEPARTMENT Provider Note   CSN: [id number] Arrival date & time: 03/05/18  7915    History   Chief Complaint Chief Complaint  Patient presents with  . Seizures    HPI Reginald [name] is a 58 y.o. male.     Level 5 caveat for acuity of condition.  Most of history obtained from wife.  Patient had a "seizure" at 0500 today.  Seizure was described as generalized shaking especially in his left arm.  This is typical according to the wife.  He does take Keppra and Depakote.  No obvious inciting the or precipitating events.  She relates his seizures to stress.  EMS reported patient was alert and oriented x4 on arrival to the house.  There was a heavy odor of marijuana according to the EMS notes.  No fever or stiff neck.     Past Medical History:  Diagnosis Date  . GERD (gastroesophageal reflux disease)   . Hypertension   . Seizures (HCC)   . Wears glasses     Patient Active Problem List   Diagnosis Date Noted  . AKI (acute kidney injury) (HCC)   . Gastritis   . Acute respiratory failure with hypoxia (HCC) 09/22/2017  . Status epilepticus (HCC) 09/19/2017  . Lactic acidosis 09/19/2017  . UGI bleed 09/19/2017  . Alcohol abuse 05/24/2015  . Seizure (HCC) 05/22/2015  . Aspiration pneumonia (HCC) 03/17/2015  . 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 History of hypertension 03/17/2015  . Convulsions/seizures (HCC) 03/17/2015  . 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 Hypokalemia     Past Surgical History:  Procedure Laterality Date  . COLONOSCOPY     remote past in [place]. ?Polyp. Wife unsure who performed or when  . SHOULDER ARTHROSCOPY WITH SUBACROMIAL DECOMPRESSION Right 05/06/2013   Procedure: Right shoulder arthroscopy rotator cuff tear repair, subacromial decompression;  Surgeon: Mable Paris, MD;  Location: [facility];  Service: Orthopedics;  Laterality: Right;  Right shoulder arthroscopy  rotator cuff tear repair, subacromial decompression  . TONSILLECTOMY    . UMBILICAL HERNIA REPAIR  2007  . WISDOM TOOTH EXTRACTION          Home Medications    Prior to Admission medications   Medication Sig Start Date End Date Taking? Authorizing Provider  amLODipine (NORVASC) 10 MG tablet Take 10 mg by mouth daily. 03/11/17  Yes [provider]  aspirin 81 MG chewable tablet Chew 1 tablet (81 mg total) by mouth daily. 03/20/15  Yes Rhetta Mura, MD  divalproex (DEPAKOTE) 500 MG DR tablet Take 1 tablet (500 mg total) by mouth every 12 (twelve) hours. 09/25/17  Yes Vassie Loll, MD  hydrochlorothiazide (HYDRODIURIL) 25 MG tablet Take 1 tablet by mouth every morning. 03/02/17  Yes [provider]  levETIRAcetam (KEPPRA) 750 MG tablet Take 2 tablets (1,500 mg total) by mouth 2 (two) times daily. 09/25/17  Yes Vassie Loll, MD  LORazepam (ATIVAN) 1 MG tablet Take 1 every 6 hours if needed for minor seizures or shaking or anxiety 03/28/17  Yes Bethann Berkshire, MD  magnesium oxide (MAG-OX) 400 MG tablet Take 1 tablet (400 mg total) by mouth daily. 09/25/17  Yes Vassie Loll, MD  Multiple Vitamin (MULTIVITAMIN WITH MINERALS) TABS tablet Take 1 tablet by mouth daily. 09/26/17  Yes Vassie Loll, MD  olmesartan (BENICAR) 20 MG tablet Take 20 mg by mouth daily. 03/01/17  Yes [provider]  Omega-3 Fatty Acids (FISH OIL)  1000 MG CAPS Take 1 capsule by mouth daily.    Yes [provider]  pantoprazole (PROTONIX) 40 MG tablet Take 1 tablet (40 mg total) by mouth daily. 09/25/17  Yes Vassie Loll, MD  potassium chloride SA (K-DUR,KLOR-CON) 20 MEQ tablet Take 1 tablet (20 mEq total) by mouth daily. 09/25/17  Yes Vassie Loll, MD  tadalafil (CIALIS) 5 MG tablet Take 5 mg by mouth daily as needed for erectile dysfunction.   Yes [provider]    Family History Family History  Problem Relation Age of Onset  . Hypertension Mother   . Breast cancer Sister    . Colon cancer Neg Hx   . Liver disease Neg Hx     Social History Social History   Tobacco Use  . Smoking status: Current Every Day Smoker    Packs/day: 0.25  . Smokeless tobacco: Current User  Substance Use Topics  . Alcohol use: Yes    Comment: 2 tbsp bourban daily in coffee for 10 years   . Drug use: Yes    Types: Marijuana    Comment: current     Allergies   Penicillins   Review of Systems Review of Systems  Unable to perform ROS: Acuity of condition     Physical Exam Updated Vital Signs BP 136/83 (BP Location: Left Arm)   Pulse 68   Temp 98.4 F (36.9 C) (Oral)   Resp 16   Ht 6\' 4"  (1.93 m)   Wt 113.4 kg   SpO2 98%   BMI 30.43 kg/m   Physical Exam Vitals signs and nursing note reviewed.  Constitutional:      Appearance: He is well-developed.     Comments: Slightly confused, restless, no obvious seizure noted  HENT:     Head: Normocephalic and atraumatic.  Eyes:     Conjunctiva/sclera: Conjunctivae normal.  Neck:     Musculoskeletal: Neck supple.  Cardiovascular:     Rate and Rhythm: Normal rate and regular rhythm.  Pulmonary:     Effort: Pulmonary effort is normal.     Breath sounds: Normal breath sounds.  Abdominal:     General: Bowel sounds are normal.     Palpations: Abdomen is soft.  Musculoskeletal: Normal range of motion.  Skin:    General: Skin is warm and dry.  Neurological:     Mental Status: He is oriented to person, place, and time.  Psychiatric:        Behavior: Behavior normal.      ED Treatments / Results  Labs (all labs ordered are listed, but only abnormal results are displayed) Labs Reviewed  CBC WITH DIFFERENTIAL/PLATELET - Abnormal; Notable for the following components:      Result Value   WBC 11.5 (*)    MCH 25.8 (*)    RDW 16.8 (*)    Platelets 403 (*)    Neutro Abs 8.1 (*)    Monocytes Absolute 1.1 (*)    Abs Immature Granulocytes 0.08 (*)    All other components within normal limits  URINALYSIS,  ROUTINE W REFLEX MICROSCOPIC - Abnormal; Notable for the following components:   Hgb urine dipstick SMALL (*)    All other components within normal limits  COMPREHENSIVE METABOLIC PANEL    EKG None  Radiology Ct Head Wo Contrast  Result Date: 03/05/2018 CLINICAL DATA:  Seizure like activity at 0540 hours today. Left-sided headache. History of seizures. EXAM: CT HEAD WITHOUT CONTRAST TECHNIQUE: Contiguous axial images were obtained from the base of  the skull through the vertex without intravenous contrast. COMPARISON:  09/19/2017 CT.  03/17/2015 MRI. FINDINGS: Brain: Old right cerebellar infarction. Old white matter infarctions of the right frontal white matter. No sign of acute infarction, mass lesion, hemorrhage, hydrocephalus or extra-axial collection. Vascular: No abnormal vascular finding. Skull: Normal Sinuses/Orbits: Ordinary seasonal mucosal thickening of the paranasal sinuses. No advanced sinusitis. Orbits negative. Other: None IMPRESSION: No acute finding. 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 Electronically Signed   By: Paulina FusiMark  Shogry M.D.   On: 03/05/2018 09:23    Procedures Procedures (including critical care time)  Medications Ordered in ED Medications  sodium chloride 0.9 % bolus 500 mL (0 mLs Intravenous Stopped 03/05/18 0935)  levETIRAcetam (KEPPRA) IVPB 1000 mg/100 mL premix (0 mg Intravenous Stopped 03/05/18 0935)  LORazepam (ATIVAN) injection 1 mg (1 mg Intravenous Given 03/05/18 0816)     Initial Impression / Assessment and Plan / ED Course  I have reviewed the triage vital signs and the nursing notes.  Pertinent labs & imaging results that were available during my care of the patient were reviewed by me and considered in my medical decision making (see chart for details).        Uncertain etiology of patient's "seizure".  CT scan of head negative for acute findings.  Basic labs acceptable.  Patient did  not receive any of his seizure medicines today.  I administered intravenous Keppra.  Patient was observed for several hours with no obvious seizure activity.  I discussed the findings with the patient and his wife.  They are comfortable with discharge and outpatient follow-up.   CRITICAL CARE Performed by: Donnetta HutchingBrian Ricahrd Schwager Total critical care time: 30 minutes Critical care time was exclusive of separately billable procedures and treating other patients. Critical care was necessary to treat or prevent imminent or life-threatening deterioration. Critical care was time spent personally by me on the following activities: development of treatment plan with patient and/or surrogate as well as nursing, discussions with consultants, evaluation of patient's response to treatment, examination of patient, obtaining history from patient or surrogate, ordering and performing treatments and interventions, ordering and review of laboratory studies, ordering and review of radiographic studies, pulse oximetry and re-evaluation of patient's condition.  Final Clinical Impressions(s) / ED Diagnoses   Final diagnoses:  Seizure [facility](HCC)    ED Discharge Orders    None       Donnetta Hutchingook, Giancarlos Berendt, MD 03/06/18 [phone]

## 2018-11-18 IMAGING — DX DG CHEST 2V
2 series · 2 of 2 positions shown · non-contrast
Comparison: Single-view chest 09/22/2017, 09/21/2017 and
09/19/2017. PA and lateral chest 03/18/2015.

CLINICAL DATA: Seizure today.

EXAM:
CHEST - 2 VIEW

[chest lat]
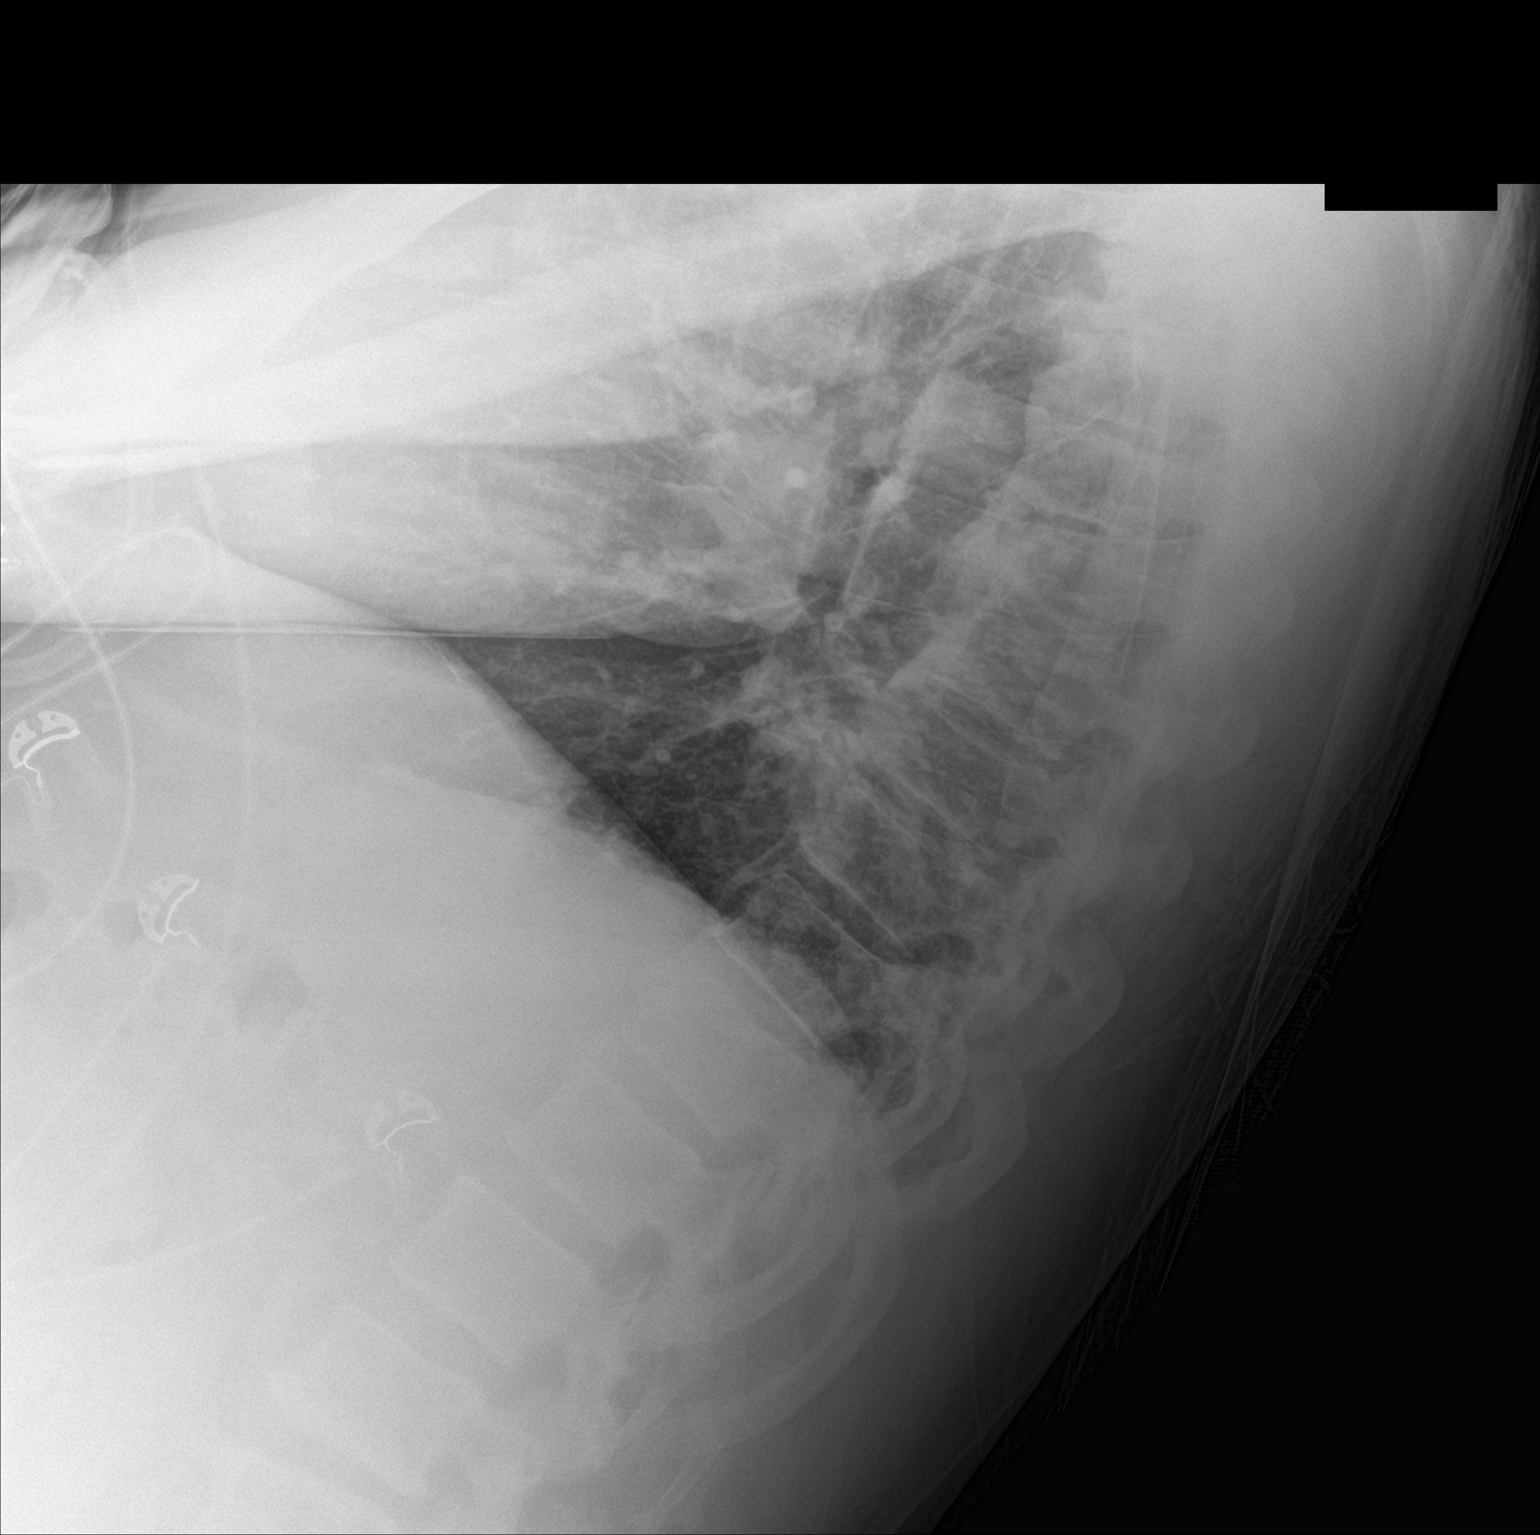

[chest ap]
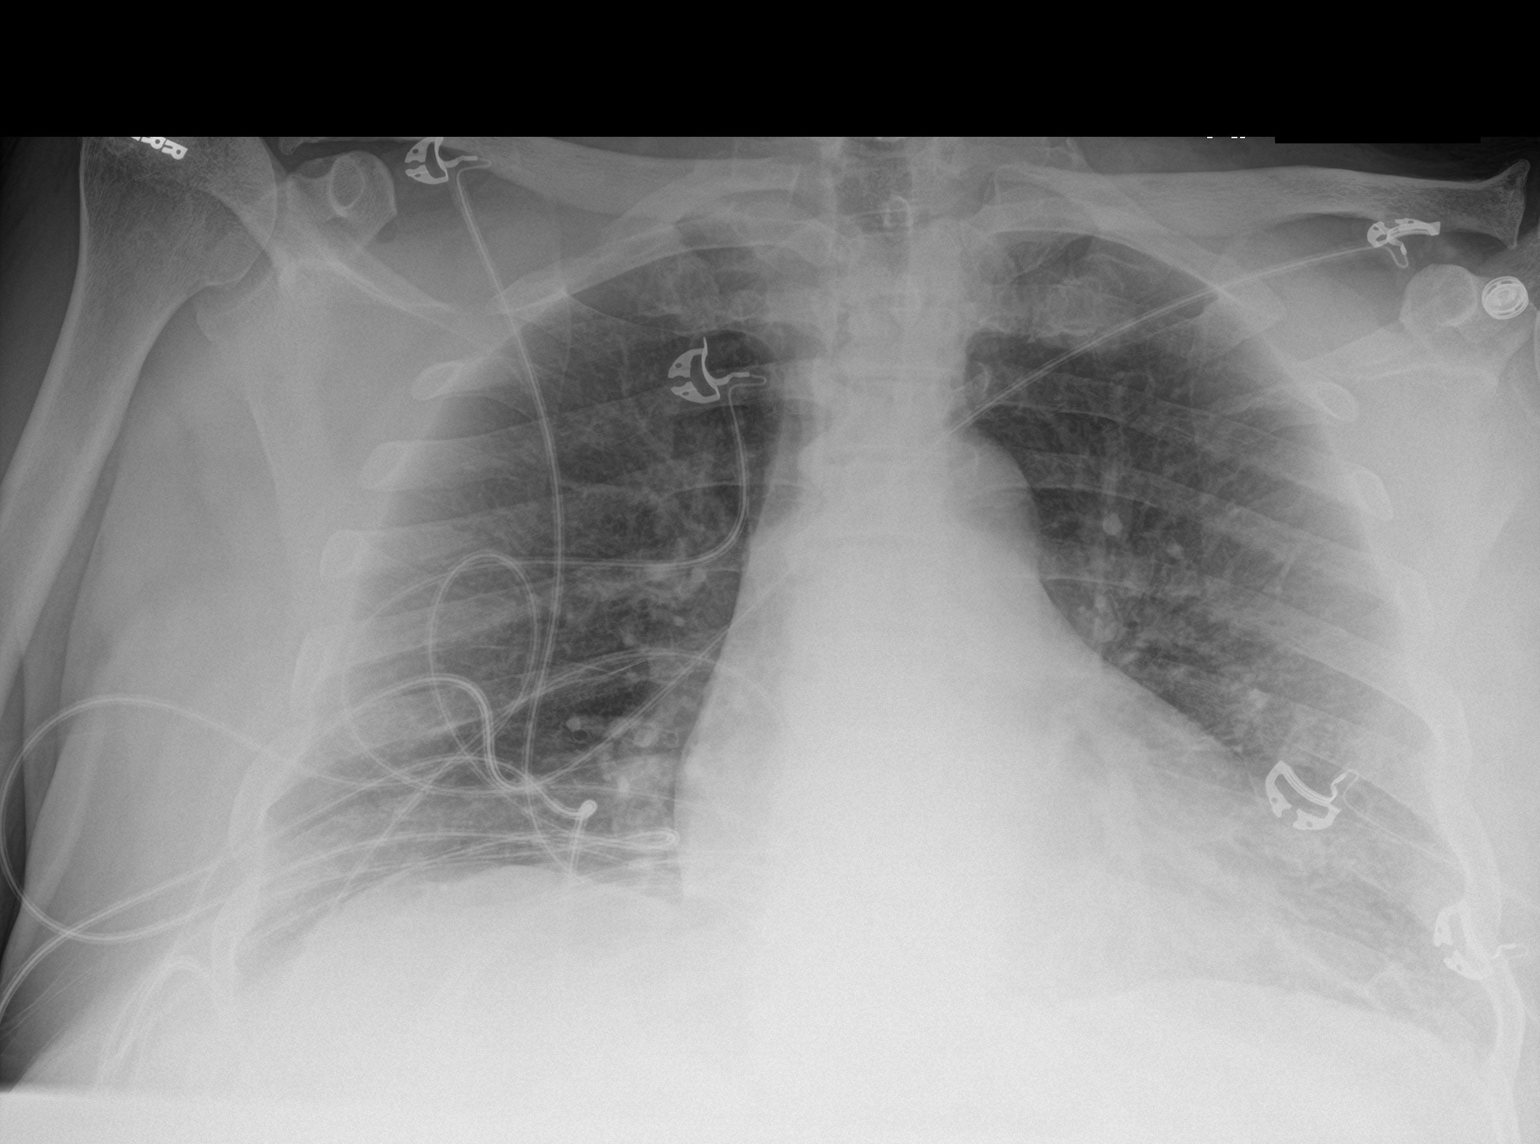

[2 of 2 positions shown; findings below may reference images not displayed]

FINDINGS: Lung volumes are somewhat low with crowding of the bronchovascular
structures. No edema, consolidative process, pneumothorax or
effusion. Heart size is normal. No acute or focal bony abnormality.
IMPRESSION: No acute disease.

## 2020-08-25 DIAGNOSIS — H5213 Myopia, bilateral: Secondary | ICD-10-CM | POA: Diagnosis not present

## 2021-05-13 ENCOUNTER — Encounter (HOSPITAL_COMMUNITY): Payer: Self-pay | Admitting: Emergency Medicine

## 2021-05-13 ENCOUNTER — Emergency Department (HOSPITAL_COMMUNITY): Payer: No Typology Code available for payment source

## 2021-05-13 ENCOUNTER — Emergency Department (HOSPITAL_COMMUNITY)
Admission: EM | Admit: 2021-05-13 | Discharge: 2021-05-13 | Disposition: A | Discharge status: Home / Self Care | Payer: No Typology Code available for payment source | Attending: Emergency Medicine | Admitting: Emergency Medicine

## 2021-05-13 DIAGNOSIS — G40909 Epilepsy, unspecified, not intractable, without status epilepticus: Secondary | ICD-10-CM | POA: Diagnosis not present

## 2021-05-13 DIAGNOSIS — R569 Unspecified convulsions: Secondary | ICD-10-CM | POA: Diagnosis present

## 2021-05-13 DIAGNOSIS — Z7982 Long term (current) use of aspirin: Secondary | ICD-10-CM | POA: Diagnosis not present

## 2021-05-13 DIAGNOSIS — Z79899 Other long term (current) drug therapy: Secondary | ICD-10-CM | POA: Diagnosis not present

## 2021-05-13 LAB — CBC WITH DIFFERENTIAL/PLATELET
Abs Immature Granulocytes: 0.05 10*3/uL (ref 0.00–0.07)
Basophils Absolute: 0 10*3/uL (ref 0.0–0.1)
Basophils Relative: 0 %
Eosinophils Absolute: 0.1 10*3/uL (ref 0.0–0.5)
Eosinophils Relative: 1 %
HCT: 42.5 % (ref 39.0–52.0)
Hemoglobin: 13.5 g/dL (ref 13.0–17.0)
Immature Granulocytes: 1 %
Lymphocytes Relative: 18 %
Lymphs Abs: 1.5 10*3/uL (ref 0.7–4.0)
MCH: 25.9 pg — ABNORMAL LOW (ref 26.0–34.0)
MCHC: 31.8 g/dL (ref 30.0–36.0)
MCV: 81.4 fL (ref 80.0–100.0)
Monocytes Absolute: 0.9 10*3/uL (ref 0.1–1.0)
Monocytes Relative: 11 %
Neutro Abs: 6.1 10*3/uL (ref 1.7–7.7)
Neutrophils Relative %: 69 %
Platelets: 320 10*3/uL (ref 150–400)
RBC: 5.22 MIL/uL (ref 4.22–5.81)
RDW: 17.1 % — ABNORMAL HIGH (ref 11.5–15.5)
WBC: 8.7 10*3/uL (ref 4.0–10.5)
nRBC: 0 % (ref 0.0–0.2)

## 2021-05-13 LAB — BASIC METABOLIC PANEL
Anion gap: 10 (ref 5–15)
BUN: 13 mg/dL (ref 6–20)
CO2: 25 mmol/L (ref 22–32)
Calcium: 9.1 mg/dL (ref 8.9–10.3)
Chloride: 105 mmol/L (ref 98–111)
Creatinine, Ser: 1.15 mg/dL (ref 0.61–1.24)
GFR, Estimated: >60 mL/min (ref >60)
Glucose, Bld: 102 mg/dL — ABNORMAL HIGH (ref 70–99)
Potassium: 3.4 mmol/L — ABNORMAL LOW (ref 3.5–5.1)
Sodium: 140 mmol/L (ref 135–145)

## 2021-05-13 LAB — VALPROIC ACID LEVEL: Valproic Acid Lvl: 65 ug/mL (ref 50.0–100.0)

## 2021-05-13 MED ORDER — IPRATROPIUM-ALBUTEROL 0.5-2.5 (3) MG/3ML IN SOLN
3.0000 mL | Freq: Once | RESPIRATORY_TRACT | Status: AC
  Administered 2021-05-13: 3 mL via RESPIRATORY_TRACT
  Filled 2021-05-13: qty 3

## 2021-05-13 MED ORDER — LEVETIRACETAM IN NACL 1000 MG/100ML IV SOLN
1000.0000 mg | Freq: Once | INTRAVENOUS | Status: AC
  Administered 2021-05-13: 1000 mg via INTRAVENOUS
  Filled 2021-05-13: qty 100

## 2021-05-13 MED ORDER — SODIUM CHLORIDE 0.9 % IV BOLUS
1000.0000 mL | Freq: Once | INTRAVENOUS | Status: AC
  Administered 2021-05-13: 1000 mL via INTRAVENOUS

## 2021-05-13 MED ORDER — LEVETIRACETAM 750 MG PO TABS: 1500.0000 mg | ORAL_TABLET | Freq: Two times a day (BID) | ORAL | 0 refills | Status: DC

## 2021-05-13 NOTE — ED Provider Notes (Signed)
?[facility] EMERGENCY DEPARTMENT ?Provider Note ? ? ?CSN: [id number] ?Arrival date & time: 05/13/21  0531 ? ?  ? ?History ? ?Chief Complaint  ?Patient presents with  ? Seizures  ? ? ?Reginald [name] is a 61 y.o. male. ? ?Patient is a 61 year old male with past medical history of seizure disorder.  Patient presenting today after experiencing a seizure at home.  Patient was asleep when this episode began.  EMS was called and patient was transported here.  He reports being out of his Keppra for the past 2 days.  He is awaiting this prescription to be mailed to him.  Patient denies any fevers or chills.  He denies any symptoms leading up to this episode.  He did receive Versed by EMS in route. ? ?The history is provided by the patient.  ?Seizures ?Seizure activity on arrival: no   ?Seizure type:  Grand mal ?Initial focality:  None ?Episode characteristics: generalized shaking   ?Postictal symptoms: confusion   ?Return to baseline: yes   ?Severity:  Moderate ?Timing:  Once ?Progression:  Resolved ? ?  ? ?Home Medications ?Prior to Admission medications   ?Medication Sig Start Date End Date Taking? Authorizing Provider  ?amLODipine (NORVASC) 10 MG tablet Take 10 mg by mouth daily. 03/11/17   [provider]  ?aspirin 81 MG chewable tablet Chew 1 tablet (81 mg total) by mouth daily. 03/20/15   Rhetta Mura, MD  ?divalproex (DEPAKOTE) 500 MG DR tablet Take 1 tablet (500 mg total) by mouth every 12 (twelve) hours. 09/25/17   Vassie Loll, MD  ?hydrochlorothiazide (HYDRODIURIL) 25 MG tablet Take 1 tablet by mouth every morning. 03/02/17   [provider]  ?levETIRAcetam (KEPPRA) 750 MG tablet Take 2 tablets (1,500 mg total) by mouth 2 (two) times daily. 09/25/17   Vassie Loll, MD  ?LORazepam (ATIVAN) 1 MG tablet Take 1 every 6 hours if needed for minor seizures or shaking or anxiety 03/28/17   Bethann Berkshire, MD  ?magnesium oxide (MAG-OX) 400 MG tablet Take 1 tablet (400 mg total) by mouth daily. 09/25/17    Vassie Loll, MD  ?Multiple Vitamin (MULTIVITAMIN WITH MINERALS) TABS tablet Take 1 tablet by mouth daily. 09/26/17   Vassie Loll, MD  ?olmesartan (BENICAR) 20 MG tablet Take 20 mg by mouth daily. 03/01/17   [provider]  ?Omega-3 Fatty Acids (FISH OIL) 1000 MG CAPS Take 1 capsule by mouth daily.     [provider]  ?pantoprazole (PROTONIX) 40 MG tablet Take 1 tablet (40 mg total) by mouth daily. 09/25/17   Vassie Loll, MD  ?potassium chloride SA (K-DUR,KLOR-CON) 20 MEQ tablet Take 1 tablet (20 mEq total) by mouth daily. 09/25/17   Vassie Loll, MD  ?tadalafil (CIALIS) 5 MG tablet Take 5 mg by mouth daily as needed for erectile dysfunction.    [provider]  ?   ? ?Allergies    ?Penicillins   ? ?Review of Systems   ?Review of Systems  ?Neurological:  Positive for seizures.  ?All other systems reviewed and are negative. ? ?Physical Exam ?Updated Vital Signs ?BP (!) 119/58   Pulse 73   Temp 97.8 ?F (36.6 ?C) (Oral)   Resp 14   Ht 6\' 4"  (1.93 m)   Wt 106.1 kg   SpO2 93%   BMI 28.48 kg/m?  ?Physical Exam ?Vitals and nursing note reviewed.  ?Constitutional:   ?   General: He is not in acute distress. ?   Appearance: He is well-developed. He is  not diaphoretic.  ?HENT:  ?   Head: Normocephalic and atraumatic.  ?Cardiovascular:  ?   Rate and Rhythm: Normal rate and regular rhythm.  ?   Heart sounds: No murmur heard. ?  No friction rub.  ?Pulmonary:  ?   Effort: Pulmonary effort is normal. No respiratory distress.  ?   Breath sounds: Normal breath sounds. No wheezing or rales.  ?Abdominal:  ?   General: Bowel sounds are normal. There is no distension.  ?   Palpations: Abdomen is soft.  ?   Tenderness: There is no abdominal tenderness.  ?Musculoskeletal:     ?   General: Normal range of motion.  ?   Cervical back: Normal range of motion and neck supple.  ?Skin: ?   General: Skin is warm and dry.  ?Neurological:  ?   General: No focal deficit present.  ?   Mental Status: He is alert  and oriented to person, place, and time.  ?   Cranial Nerves: No cranial nerve deficit.  ?   Sensory: No sensory deficit.  ?   Motor: No weakness.  ?   Coordination: Coordination normal.  ? ? ?ED Results / Procedures / Treatments   ?Labs ?(all labs ordered are listed, but only abnormal results are displayed) ?Labs Reviewed  ?BASIC METABOLIC PANEL  ?CBC WITH DIFFERENTIAL/PLATELET  ?VALPROIC ACID LEVEL  ? ? ?EKG ?None ? ?Radiology ?No results found. ? ?Procedures ?Procedures  ? ? ?Medications Ordered in ED ?Medications  ?sodium chloride 0.9 % bolus 1,000 mL (has no administration in time range)  ?levETIRAcetam (KEPPRA) IVPB 1000 mg/100 mL premix (has no administration in time range)  ? ? ?ED Course/ Medical Decision Making/ A&P ? ?Patient presenting here with complaints of seizure.  He has been apparently off of his Keppra for the past 5 days after running out of this medication.  He was given Versed by EMS and is now back to his baseline. ? ?Laboratory studies currently pending.  Patient is receiving IV Keppra.  He does have some hypoxia, but I suspect this is related to a postictal phase.  His chest x-ray is clear. ? ?Care signed out to oncoming provider at shift change.  Dr. Estell Harpin will obtain the results of the laboratory studies, perform further observation, and determine the final disposition, but discharge is anticipated. ? ?Final Clinical Impression(s) / ED Diagnoses ?Final diagnoses:  ?None  ? ? ?Rx / DC Orders ?ED Discharge Orders   ? ? None  ? ?  ? ? ?  ?Geoffery Lyons, MD ?05/14/21 [phone] ? ?

## 2021-05-13 NOTE — ED Triage Notes (Signed)
Pt brought in by [facility] from home after multiple seizures. Pt has been out of his medication for the past 4 days due to pharmacy error. Pt given 2mg  versed IV by EMS.  ?

## 2021-05-13 NOTE — Discharge Instructions (Addendum)
Continue taking Keppra as previously prescribed.  A prescription has been sent to your pharmacy for an additional supply of this medication. ? ?Continue other medications as previously prescribed. ? ?Return to the emergency department if you develop any new and/or concerning symptoms. ?

## 2022-03-23 DIAGNOSIS — K080 Exfoliation of teeth due to systemic causes: Secondary | ICD-10-CM | POA: Diagnosis not present

## 2022-03-28 DIAGNOSIS — K080 Exfoliation of teeth due to systemic causes: Secondary | ICD-10-CM | POA: Diagnosis not present

## 2022-05-07 IMAGING — DX DG CHEST 1V PORT
1 series · 1 of 1 positions shown · non-contrast
Comparison: Chest x-ray 11/24/2017.

CLINICAL DATA: 60-year-old male with history of multiple seizures.
Shortness of breath.

EXAM:
PORTABLE CHEST 1 VIEW

[chest ap]
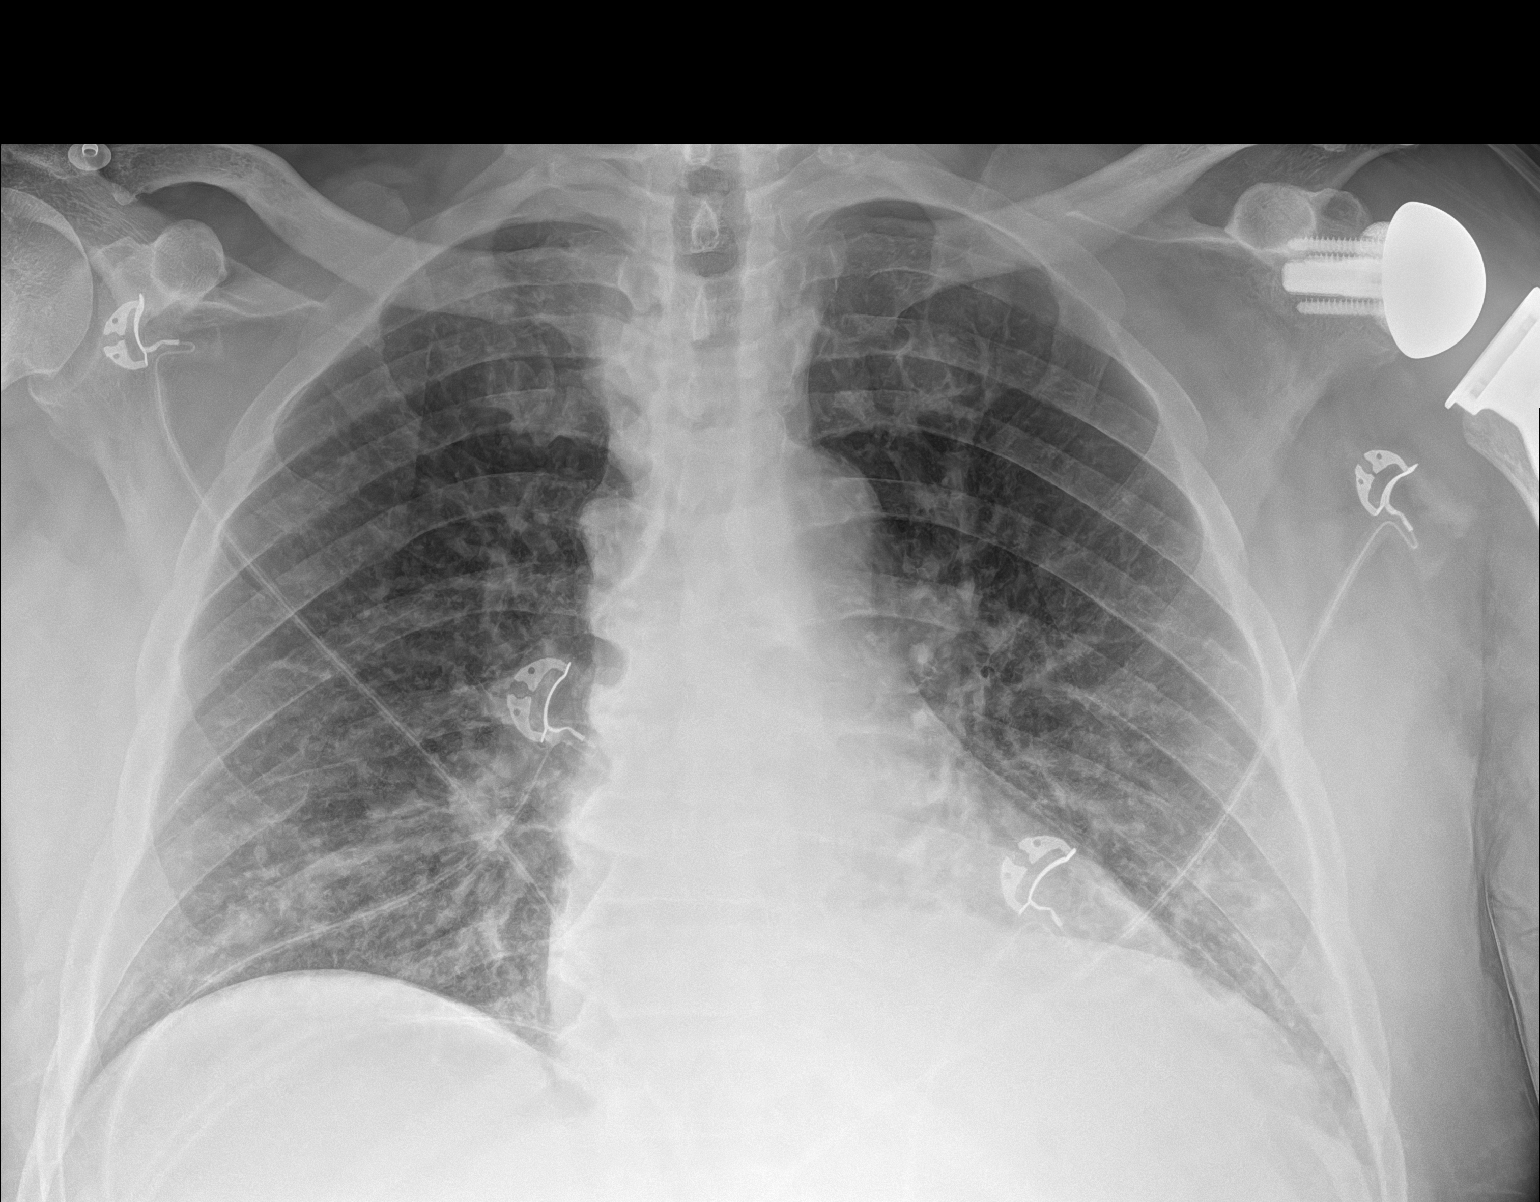

[1 of 1 positions shown; findings below may reference images not displayed]

FINDINGS: Lung volumes are low. Widespread areas of interstitial prominence
and peribronchial cuffing are noted throughout the mid to lower
lungs bilaterally. Mild elevation of the left hemidiaphragm. No
consolidative airspace disease. No pleural effusions. No
pneumothorax. No pulmonary nodule or mass noted. Pulmonary
vasculature and the cardiomediastinal silhouette are within normal
limits. Status post left shoulder arthroplasty.
IMPRESSION: 1. Findings are concerning for severe acute bronchitis, as above.

## 2022-05-18 ENCOUNTER — Encounter (HOSPITAL_COMMUNITY): Payer: Self-pay

## 2022-05-18 ENCOUNTER — Other Ambulatory Visit: Payer: Self-pay

## 2022-05-18 ENCOUNTER — Emergency Department (HOSPITAL_COMMUNITY): Payer: No Typology Code available for payment source

## 2022-05-18 ENCOUNTER — Emergency Department (HOSPITAL_COMMUNITY)
Admission: EM | Admit: 2022-05-18 | Discharge: 2022-05-18 | Disposition: A | Discharge status: Home / Self Care | Payer: No Typology Code available for payment source | Attending: Emergency Medicine | Admitting: Emergency Medicine

## 2022-05-18 DIAGNOSIS — Z7982 Long term (current) use of aspirin: Secondary | ICD-10-CM | POA: Diagnosis not present

## 2022-05-18 DIAGNOSIS — Z743 Need for continuous supervision: Secondary | ICD-10-CM | POA: Diagnosis not present

## 2022-05-18 DIAGNOSIS — X58XXXA Exposure to other specified factors, initial encounter: Secondary | ICD-10-CM | POA: Insufficient documentation

## 2022-05-18 DIAGNOSIS — E876 Hypokalemia: Secondary | ICD-10-CM | POA: Insufficient documentation

## 2022-05-18 DIAGNOSIS — R569 Unspecified convulsions: Secondary | ICD-10-CM | POA: Diagnosis not present

## 2022-05-18 DIAGNOSIS — G40919 Epilepsy, unspecified, intractable, without status epilepticus: Secondary | ICD-10-CM

## 2022-05-18 DIAGNOSIS — S01112A Laceration without foreign body of left eyelid and periocular area, initial encounter: Secondary | ICD-10-CM | POA: Diagnosis not present

## 2022-05-18 LAB — CBC WITH DIFFERENTIAL/PLATELET
Abs Immature Granulocytes: 0.04 10*3/uL (ref 0.00–0.07)
Basophils Absolute: 0.1 10*3/uL (ref 0.0–0.1)
Basophils Relative: 1 %
Eosinophils Absolute: 0.1 10*3/uL (ref 0.0–0.5)
Eosinophils Relative: 1 %
HCT: 42.9 % (ref 39.0–52.0)
Hemoglobin: 13.8 g/dL (ref 13.0–17.0)
Immature Granulocytes: 0 %
Lymphocytes Relative: 26 %
Lymphs Abs: 2.6 10*3/uL (ref 0.7–4.0)
MCH: 26.7 pg (ref 26.0–34.0)
MCHC: 32.2 g/dL (ref 30.0–36.0)
MCV: 83 fL (ref 80.0–100.0)
Monocytes Absolute: 0.9 10*3/uL (ref 0.1–1.0)
Monocytes Relative: 9 %
Neutro Abs: 6.4 10*3/uL (ref 1.7–7.7)
Neutrophils Relative %: 63 %
Platelets: 349 10*3/uL (ref 150–400)
RBC: 5.17 MIL/uL (ref 4.22–5.81)
RDW: 16.1 % — ABNORMAL HIGH (ref 11.5–15.5)
WBC: 10.1 10*3/uL (ref 4.0–10.5)
nRBC: 0 % (ref 0.0–0.2)

## 2022-05-18 LAB — BASIC METABOLIC PANEL
Anion gap: 13 (ref 5–15)
BUN: 6 mg/dL (ref 6–20)
CO2: 22 mmol/L (ref 22–32)
Calcium: 9.3 mg/dL (ref 8.9–10.3)
Chloride: 104 mmol/L (ref 98–111)
Creatinine, Ser: 1.11 mg/dL (ref 0.61–1.24)
GFR, Estimated: >60 mL/min (ref >60)
Glucose, Bld: 120 mg/dL — ABNORMAL HIGH (ref 70–99)
Potassium: 3.2 mmol/L — ABNORMAL LOW (ref 3.5–5.1)
Sodium: 139 mmol/L (ref 135–145)

## 2022-05-18 LAB — VALPROIC ACID LEVEL: Valproic Acid Lvl: 62 ug/mL (ref 50.0–100.0)

## 2022-05-18 MED ORDER — SODIUM CHLORIDE 0.9 % IV BOLUS
1000.0000 mL | Freq: Once | INTRAVENOUS | Status: AC
  Administered 2022-05-18: 1000 mL via INTRAVENOUS

## 2022-05-18 MED ORDER — LORAZEPAM 2 MG/ML IJ SOLN
1.0000 mg | Freq: Once | INTRAMUSCULAR | Status: AC
  Administered 2022-05-18: 1 mg via INTRAVENOUS
  Filled 2022-05-18: qty 1

## 2022-05-18 NOTE — Discharge Instructions (Signed)
Continue medications as previously prescribed.  Follow-up with your neurologist in the next week.

## 2022-05-18 NOTE — ED Provider Notes (Signed)
[facility] EMERGENCY DEPARTMENT AT [facility] Provider Note   CSN: [id number] Arrival date & time: 05/18/22  0202     History  Chief Complaint  Patient presents with   Seizures    Reginald [name] is a 62 y.o. male.  Patient is a 62 year old male with past medical history of seizure disorder.  Patient presenting with complaints of seizure.  He was asleep in bed with his wife this evening when he began with generalized shaking that lasted several minutes.  The wife called EMS and patient was transported here.  By the time they arrived, his seizure had resolved.  He denies any oral trauma.  He denies any bowel or bladder incontinence.  He does have a contusion to the frontal scalp and a small laceration over his left eye.  Patient takes Keppra and reports to me he has been compliant with this.  The history is provided by the patient.       Home Medications Prior to Admission medications   Medication Sig Start Date End Date Taking? Authorizing Provider  amLODipine (NORVASC) 10 MG tablet Take 10 mg by mouth daily. 03/11/17   [provider]  aspirin 81 MG chewable tablet Chew 1 tablet (81 mg total) by mouth daily. 03/20/15   Rhetta Mura, MD  divalproex (DEPAKOTE) 500 MG DR tablet Take 1 tablet (500 mg total) by mouth every 12 (twelve) hours. Patient not taking: Reported on 05/13/2021 09/25/17   Vassie Loll, MD  hydrochlorothiazide (HYDRODIURIL) 25 MG tablet Take 1 tablet by mouth every morning. 03/02/17   [provider]  levETIRAcetam (KEPPRA) 750 MG tablet Take 2 tablets (1,500 mg total) by mouth 2 (two) times daily. 05/13/21   Geoffery Lyons, MD  LORazepam (ATIVAN) 1 MG tablet Take 1 every 6 hours if needed for minor seizures or shaking or anxiety Patient not taking: Reported on 05/13/2021 03/28/17   Bethann Berkshire, MD  losartan (COZAAR) 25 MG tablet Take 1 tablet by mouth daily. 05/03/21   [provider]  magnesium oxide (MAG-OX) 400 MG tablet  Take 1 tablet (400 mg total) by mouth daily. Patient not taking: Reported on 05/13/2021 09/25/17   Vassie Loll, MD  Multiple Vitamin (MULTIVITAMIN WITH MINERALS) TABS tablet Take 1 tablet by mouth daily. 09/26/17   Vassie Loll, MD  pantoprazole (PROTONIX) 40 MG tablet Take 1 tablet (40 mg total) by mouth daily. Patient not taking: Reported on 05/13/2021 09/25/17   Vassie Loll, MD  potassium chloride SA (K-DUR,KLOR-CON) 20 MEQ tablet Take 1 tablet (20 mEq total) by mouth daily. Patient not taking: Reported on 05/13/2021 09/25/17   Vassie Loll, MD      Allergies    Penicillins    Review of Systems   Review of Systems  All other systems reviewed and are negative.   Physical Exam Updated Vital Signs BP 117/79 (BP Location: Left Arm)   Pulse 89   Temp 98.1 F (36.7 C) (Oral)   Resp 15   Ht 6\' 4"  (1.93 m)   Wt 107.5 kg   SpO2 95%   BMI 28.85 kg/m  Physical Exam Vitals and nursing note reviewed.  Constitutional:      General: He is not in acute distress.    Appearance: He is well-developed. He is not diaphoretic.  HENT:     Head: Normocephalic and atraumatic.     Comments: There is a small, 1 cm laceration noted just above the left eye.  The wound is well-approximated and bleeding is  controlled. Cardiovascular:     Rate and Rhythm: Normal rate and regular rhythm.     Heart sounds: No murmur heard.    No friction rub.  Pulmonary:     Effort: Pulmonary effort is normal. No respiratory distress.     Breath sounds: Normal breath sounds. No wheezing or rales.  Abdominal:     General: Bowel sounds are normal. There is no distension.     Palpations: Abdomen is soft.     Tenderness: There is no abdominal tenderness.  Musculoskeletal:        General: Normal range of motion.     Cervical back: Normal range of motion and neck supple.  Skin:    General: Skin is warm and dry.  Neurological:     Mental Status: He is alert and oriented to person, place, and time.     Coordination:  Coordination normal.     ED Results / Procedures / Treatments   Labs (all labs ordered are listed, but only abnormal results are displayed) Labs Reviewed  BASIC METABOLIC PANEL  CBC WITH DIFFERENTIAL/PLATELET  VALPROIC ACID LEVEL    EKG EKG Interpretation  Date/Time:  Wednesday May 18 2022 02:09:32 EDT Ventricular Rate:  78 PR Interval:    QRS Duration: 104 QT Interval:  376 QTC Calculation: 429 R Axis:   46 Text Interpretation: Sinus rhythm Baseline wander in lead(s) V3 Abnormal R wave progression Confirmed by Geoffery Lyons ([id number]) on 05/18/2022 2:20:52 AM  Radiology No results found.  Procedures Procedures    Medications Ordered in ED Medications  sodium chloride 0.9 % bolus 1,000 mL (has no administration in time range)  LORazepam (ATIVAN) injection 1 mg (has no administration in time range)    ED Course/ Medical Decision Making/ A&P  Patient is a 62 year old male presenting after a seizure as described in the HPI.  Patient arrives here neurologically intact and back to baseline.  He has no complaints at present.  Vital signs are stable and patient is afebrile.  Physical examination reveals no acute finding with the exception of abrasion/contusion to the frontal scalp and a small laceration above the left eye.  Workup initiated including CBC, metabolic panel, and Depakote level.  Blood counts and electrolytes unremarkable and Depakote level is therapeutic.  Patient given IV Ativan and has been observed for several hours.  He has had no further seizure activity and is resting comfortably.  I feel as though he can be safely discharged.  I will have him continue his current dose of medications and see what happens.  Because of this breakthrough seizure undetermined, but wife does state that he does smoke marijuana.  Perhaps this was a contributing factor.  He is to follow-up with his neurologist in the next 1 to 2 weeks.  Final Clinical Impression(s) / ED  Diagnoses Final diagnoses:  None    Rx / DC Orders ED Discharge Orders     None         Geoffery Lyons, MD 05/18/22 [phone]

## 2022-05-18 NOTE — ED Triage Notes (Signed)
Patient from home for seizure activity. EMS reports that patient had 2 seizures prior to their arrival, alert and oriented for EMS. Patient has history of seizures with medication, has not missed any doses. Patient reports he has not had a seizure in about 1 year. EMS reports patient fell during one of the seizures and hit his head on a nightstand; patient has small laceration to his L eyebrow with bleeding controlled and abrasions to the left forehead. Upon arrival to ER, patient is alert and oriented, denies any pain. EMS established 20G IV in the LFA

## 2022-05-18 NOTE — ED Notes (Signed)
Patient transported to CT 

## 2022-05-18 NOTE — ED Notes (Signed)
Patient placed on 2L O2 via [place]. Provider notified

## 2022-09-14 DIAGNOSIS — K080 Exfoliation of teeth due to systemic causes: Secondary | ICD-10-CM | POA: Diagnosis not present

## 2022-09-21 DIAGNOSIS — K080 Exfoliation of teeth due to systemic causes: Secondary | ICD-10-CM | POA: Diagnosis not present

## 2022-11-23 DIAGNOSIS — K080 Exfoliation of teeth due to systemic causes: Secondary | ICD-10-CM | POA: Diagnosis not present

## 2023-03-05 ENCOUNTER — Other Ambulatory Visit: Payer: Self-pay

## 2023-03-05 ENCOUNTER — Encounter (HOSPITAL_COMMUNITY): Payer: Self-pay | Admitting: *Deleted

## 2023-03-05 ENCOUNTER — Emergency Department (HOSPITAL_COMMUNITY)
Admission: EM | Admit: 2023-03-05 | Discharge: 2023-03-05 | Disposition: A | Discharge status: Home / Self Care | Payer: Medicare Other | Attending: Emergency Medicine | Admitting: Emergency Medicine

## 2023-03-05 DIAGNOSIS — Z760 Encounter for issue of repeat prescription: Secondary | ICD-10-CM | POA: Insufficient documentation

## 2023-03-05 DIAGNOSIS — Z7982 Long term (current) use of aspirin: Secondary | ICD-10-CM | POA: Insufficient documentation

## 2023-03-05 MED ORDER — LEVETIRACETAM 750 MG PO TABS: 1500.0000 mg | ORAL_TABLET | Freq: Two times a day (BID) | ORAL | 0 refills | Status: AC

## 2023-03-05 NOTE — Discharge Instructions (Signed)
 Was a pleasure taking care of you today.  You are seen in the ER to refill your Keppra.  I gave you a 10-day supply which should cover you until you can get your mail prescription.  Follow-up with your regular doctor, come back to the ER if needed.

## 2023-03-05 NOTE — ED Provider Notes (Signed)
 [facility] AT [facility] Provider Note   CSN: [id number] Arrival date & time: 03/05/23  1402     History  No chief complaint on file.   [facility] [name] is a 63 y.o. male. 63 year old male, history of seizures on Keppra 1500 mg twice daily.  Presents the ER for med refill.  He is waiting for his mail order pharmacy to get his back medication to him but said it could be a 7 to 10 days and does not want a lapse in his medication.  He has not had any breakthrough seizures.  He has no complaints otherwise. HPI     Home Medications Prior to Admission medications   Medication Sig Start Date End Date Taking? Authorizing Provider  amLODipine (NORVASC) 10 MG tablet Take 10 mg by mouth daily. 03/11/17   [provider]  aspirin 81 MG chewable tablet Chew 1 tablet (81 mg total) by mouth daily. 03/20/15   Rhetta Mura, MD  divalproex (DEPAKOTE) 500 MG DR tablet Take 1 tablet (500 mg total) by mouth every 12 (twelve) hours. Patient not taking: Reported on 05/13/2021 09/25/17   Vassie Loll, MD  hydrochlorothiazide (HYDRODIURIL) 25 MG tablet Take 1 tablet by mouth every morning. 03/02/17   [provider]  levETIRAcetam (KEPPRA) 750 MG tablet Take 2 tablets (1,500 mg total) by mouth 2 (two) times daily for 10 days. 03/05/23 03/15/23  Carmel Sacramento A, PA-C  LORazepam (ATIVAN) 1 MG tablet Take 1 every 6 hours if needed for minor seizures or shaking or anxiety Patient not taking: Reported on 05/13/2021 03/28/17   Bethann Berkshire, MD  losartan (COZAAR) 25 MG tablet Take 1 tablet by mouth daily. 05/03/21   [provider]  magnesium oxide (MAG-OX) 400 MG tablet Take 1 tablet (400 mg total) by mouth daily. Patient not taking: Reported on 05/13/2021 09/25/17   Vassie Loll, MD  Multiple Vitamin (MULTIVITAMIN WITH MINERALS) TABS tablet Take 1 tablet by mouth daily. 09/26/17   Vassie Loll, MD  pantoprazole (PROTONIX) 40 MG tablet Take 1 tablet (40  mg total) by mouth daily. Patient not taking: Reported on 05/13/2021 09/25/17   Vassie Loll, MD  potassium chloride SA (K-DUR,KLOR-CON) 20 MEQ tablet Take 1 tablet (20 mEq total) by mouth daily. Patient not taking: Reported on 05/13/2021 09/25/17   Vassie Loll, MD      Allergies    Penicillins    Review of Systems   Review of Systems  Physical Exam Updated Vital Signs BP 134/81 (BP Location: Right Arm)   Pulse 70   Temp 98.1 F (36.7 C) (Temporal)   Resp 16   Ht 6\' 4"  (1.93 m)   Wt 106.1 kg   SpO2 98%   BMI 28.48 kg/m  Physical Exam Vitals and nursing note reviewed.  Constitutional:      General: He is not in acute distress.    Appearance: He is well-developed.  HENT:     Head: Normocephalic and atraumatic.     Mouth/Throat:     Mouth: Mucous membranes are moist.  Eyes:     Extraocular Movements: Extraocular movements intact.     Conjunctiva/sclera: Conjunctivae normal.     Pupils: Pupils are equal, round, and reactive to light.  Cardiovascular:     Rate and Rhythm: Normal rate and regular rhythm.     Heart sounds: No murmur heard. Pulmonary:     Effort: Pulmonary effort is normal. No respiratory distress.     Breath sounds: Normal breath  sounds.  Abdominal:     Palpations: Abdomen is soft.     Tenderness: There is no abdominal tenderness.  Musculoskeletal:        General: No swelling.     Cervical back: Neck supple.  Skin:    General: Skin is warm and dry.     Capillary Refill: Capillary refill takes less than 2 seconds.  Neurological:     General: No focal deficit present.     Mental Status: He is alert and oriented to person, place, and time.  Psychiatric:        Mood and Affect: Mood normal.     [facility] Results / Procedures / Treatments   Labs (all labs ordered are listed, but only abnormal results are displayed) Labs Reviewed - No data to display  EKG None  Radiology No results found.  Procedures Procedures    Medications Ordered in  [facility] Medications - No data to display  [facility] Course/ Medical Decision Making/ A&P                                 Medical Decision Making Course: Patient here for medication refill on his Keppra he has had no breakthrough seizures he has normal vitals and normal exam, medication was refilled.  Given 10-day supply so he does not have a lapse in his medication.  Advised on follow-up and return precautions.   Risk Prescription drug management.           Final Clinical Impression(s) / [facility] Diagnoses Final diagnoses:  Repeat prescription issue    Rx / DC Orders [facility] Discharge Orders          Ordered    levETIRAcetam (KEPPRA) 750 MG tablet  2 times daily        03/05/23 [address], PA-C 03/05/23 1519    Loetta Rough, MD 03/05/23 [phone]

## 2023-03-05 NOTE — ED Triage Notes (Signed)
 Pt needs a refill for his seizure medication.  Ran out yesterday.

## 2023-04-11 DIAGNOSIS — K080 Exfoliation of teeth due to systemic causes: Secondary | ICD-10-CM | POA: Diagnosis not present

## 2023-08-22 DIAGNOSIS — K080 Exfoliation of teeth due to systemic causes: Secondary | ICD-10-CM | POA: Diagnosis not present

## 2023-09-05 DIAGNOSIS — K080 Exfoliation of teeth due to systemic causes: Secondary | ICD-10-CM | POA: Diagnosis not present

## 2023-11-15 DIAGNOSIS — Z1211 Encounter for screening for malignant neoplasm of colon: Secondary | ICD-10-CM | POA: Diagnosis not present
